# Patient Record
Sex: Female | Born: 1940 | Race: Black or African American | Hispanic: No | Marital: Single | Smoking: Never smoker
Health system: Southern US, Community
[De-identification: ages and names within clinical notes are randomized; demographics above are authoritative.]

## PROBLEM LIST (undated history)

## (undated) DIAGNOSIS — E119 Type 2 diabetes mellitus without complications: Secondary | ICD-10-CM

## (undated) HISTORY — PX: ABDOMINAL HYSTERECTOMY: SHX81

## (undated) HISTORY — PX: NECK SURGERY: SHX720

## (undated) HISTORY — PX: BACK SURGERY: SHX140

## (undated) HISTORY — PX: REPLACEMENT TOTAL KNEE BILATERAL: SUR1225

---

## 2017-07-02 ENCOUNTER — Emergency Department (HOSPITAL_BASED_OUTPATIENT_CLINIC_OR_DEPARTMENT_OTHER)
Admission: EM | Admit: 2017-07-02 | Discharge: 2017-07-02 | Disposition: A | Discharge status: Home / Self Care | Payer: Medicare HMO | Attending: Emergency Medicine | Admitting: Emergency Medicine

## 2017-07-02 ENCOUNTER — Encounter (HOSPITAL_BASED_OUTPATIENT_CLINIC_OR_DEPARTMENT_OTHER): Payer: Self-pay | Admitting: Emergency Medicine

## 2017-07-02 ENCOUNTER — Emergency Department (HOSPITAL_BASED_OUTPATIENT_CLINIC_OR_DEPARTMENT_OTHER): Payer: Medicare HMO

## 2017-07-02 DIAGNOSIS — E119 Type 2 diabetes mellitus without complications: Secondary | ICD-10-CM | POA: Diagnosis not present

## 2017-07-02 DIAGNOSIS — R519 Headache, unspecified: Secondary | ICD-10-CM

## 2017-07-02 DIAGNOSIS — R51 Headache: Secondary | ICD-10-CM | POA: Diagnosis present

## 2017-07-02 HISTORY — DX: Type 2 diabetes mellitus without complications: E11.9

## 2017-07-02 LAB — COMPREHENSIVE METABOLIC PANEL
ALK PHOS: 42 U/L (ref 38–126)
ALT: 16 U/L (ref 14–54)
AST: 18 U/L (ref 15–41)
Albumin: 3.6 g/dL (ref 3.5–5.0)
Anion gap: 3 — ABNORMAL LOW (ref 5–15)
BUN: 19 mg/dL (ref 6–20)
CALCIUM: 8.9 mg/dL (ref 8.9–10.3)
CHLORIDE: 110 mmol/L (ref 101–111)
CO2: 30 mmol/L (ref 22–32)
CREATININE: 1.42 mg/dL — AB (ref 0.44–1.00)
GFR, EST AFRICAN AMERICAN: 41 mL/min — AB (ref >60)
GFR, EST NON AFRICAN AMERICAN: 35 mL/min — AB (ref >60)
Glucose, Bld: 172 mg/dL — ABNORMAL HIGH (ref 65–99)
Potassium: 4.7 mmol/L (ref 3.5–5.1)
Sodium: 143 mmol/L (ref 135–145)
Total Bilirubin: 1.2 mg/dL (ref 0.3–1.2)
Total Protein: 6.9 g/dL (ref 6.5–8.1)

## 2017-07-02 LAB — CBC WITH DIFFERENTIAL/PLATELET
BASOS PCT: 0 %
Basophils Absolute: 0 10*3/uL (ref 0.0–0.1)
EOS ABS: 0.2 10*3/uL (ref 0.0–0.7)
EOS PCT: 2 %
HCT: 36.5 % (ref 36.0–46.0)
Hemoglobin: 12.4 g/dL (ref 12.0–15.0)
LYMPHS ABS: 2.2 10*3/uL (ref 0.7–4.0)
Lymphocytes Relative: 26 %
MCH: 30.6 pg (ref 26.0–34.0)
MCHC: 34 g/dL (ref 30.0–36.0)
MCV: 90.1 fL (ref 78.0–100.0)
Monocytes Absolute: 0.6 10*3/uL (ref 0.1–1.0)
Monocytes Relative: 7 %
NEUTROS PCT: 65 %
Neutro Abs: 5.6 10*3/uL (ref 1.7–7.7)
PLATELETS: 197 10*3/uL (ref 150–400)
RBC: 4.05 MIL/uL (ref 3.87–5.11)
RDW: 14.4 % (ref 11.5–15.5)
WBC: 8.6 10*3/uL (ref 4.0–10.5)

## 2017-07-02 MED ORDER — METOCLOPRAMIDE HCL 5 MG/ML IJ SOLN
10.0000 mg | Freq: Once | INTRAMUSCULAR | Status: AC
  Administered 2017-07-02: 10 mg via INTRAVENOUS
  Filled 2017-07-02: qty 2

## 2017-07-02 MED ORDER — SODIUM CHLORIDE 0.9 % IV BOLUS (SEPSIS)
1000.0000 mL | Freq: Once | INTRAVENOUS | Status: AC
  Administered 2017-07-02: 1000 mL via INTRAVENOUS

## 2017-07-02 MED ORDER — DIPHENHYDRAMINE HCL 50 MG/ML IJ SOLN
12.5000 mg | Freq: Once | INTRAMUSCULAR | Status: AC
  Administered 2017-07-02: 12.5 mg via INTRAVENOUS
  Filled 2017-07-02: qty 1

## 2017-07-02 MED ORDER — SUMATRIPTAN SUCCINATE 100 MG PO TABS: 100.0000 mg | ORAL_TABLET | Freq: Four times a day (QID) | ORAL | 0 refills | Status: DC | PRN

## 2017-07-02 NOTE — ED Notes (Signed)
Pt ambulating with cane, helped into wheelchair.

## 2017-07-02 NOTE — ED Provider Notes (Signed)
[facility]   CSN: [id number] Arrival date & time: 07/02/17  1044     History   Chief Complaint Chief Complaint  Patient presents with  . Headache    HPI Katherine Cook is a 76 y.o. female history diabetes here presenting with headaches. Patient states that she has posterior headaches for the last week or so. She told the nurse she has been having headaches for about 6 weeks but told me about the last week or so. She states that there is a spot on her scalp that seemed to swell up occasionally and goes away. She denies any associated neck pain or stiffness or nausea or vomiting or blurry vision or photophobia. She states that she takes Tylenol every 6 hours and it does help but it usually comes back after the Tylenol wears off. She denies any trouble speaking or slurred speech or weakness or numbness. She denies any history of strokes previously or history of headaches or migraines or aneurysms.   The history is provided by the patient.    Past Medical History:  Diagnosis Date  . Diabetes mellitus without complication (HCC)     There are no active problems to display for this patient.   Past Surgical History:  Procedure Laterality Date  . ABDOMINAL HYSTERECTOMY    . BACK SURGERY    . NECK SURGERY    . REPLACEMENT TOTAL KNEE BILATERAL      OB History    No data available       Home Medications    Prior to Admission medications   Not on File    Family History History reviewed. No pertinent family history.  Social History Social History  Substance Use Topics  . Smoking status: Never Smoker  . Smokeless tobacco: Never Used  . Alcohol use No     Allergies   Patient has no known allergies.   Review of Systems Review of Systems  Neurological: Positive for headaches.  All other systems reviewed and are negative.    Physical Exam Updated Vital Signs BP 129/73 (BP Location: Right Arm)   Pulse 80   Temp 98.7 F (37.1 C) (Oral)    Resp 20   Ht  (1.778 m)   Wt (!) 139.7 kg (308 lb)   SpO2 95%   BMI 44.19 kg/m   Physical Exam  Constitutional: She is oriented to person, place, and time. She appears well-developed and well-nourished.  HENT:  Head: Normocephalic.  Right Ear: External ear normal.  Left Ear: External ear normal.  Mouth/Throat: Oropharynx is clear and moist.  Eyes: Pupils are equal, round, and reactive to light. Conjunctivae and EOM are normal.  Neck: Normal range of motion. Neck supple.  No meningeal signs   Cardiovascular: Normal rate, regular rhythm and normal heart sounds.   Pulmonary/Chest: Effort normal and breath sounds normal. No respiratory distress. She has no wheezes. She has no rales.  Abdominal: Soft. Bowel sounds are normal. She exhibits no distension. There is no tenderness. There is no guarding.  Musculoskeletal: Normal range of motion.  Neurological: She is alert and oriented to person, place, and time. She displays normal reflexes. No cranial nerve deficit. Coordination normal.  CN 2-12 intact. Nl strength and sensation throughout. Nl finger to nose. Nl gait. Neg rhomberg   Skin: Skin is warm.  Psychiatric: She has a normal mood and affect.  Nursing note and vitals reviewed.    ED Treatments / Results  Labs (all labs ordered  are listed, but only abnormal results are displayed) Labs Reviewed  COMPREHENSIVE METABOLIC PANEL - Abnormal; Notable for the following:       Result Value   Glucose, Bld 172 (*)    Creatinine, Ser 1.42 (*)    GFR calc non Af Amer 35 (*)    GFR calc Af Amer 41 (*)    Anion gap 3 (*)    All other components within normal limits  CBC WITH DIFFERENTIAL/PLATELET    EKG  EKG Interpretation None       Radiology Ct Head Wo Contrast  Result Date: 07/02/2017 CLINICAL DATA:  Headaches for several weeks EXAM: CT HEAD WITHOUT CONTRAST TECHNIQUE: Contiguous axial images were obtained from the base of the skull through the vertex without  intravenous contrast. COMPARISON:  None. FINDINGS: Brain: Scattered areas of decreased attenuation are noted within the deep white matter consistent with chronic white matter ischemic change. No definitive acute infarct is seen. No hemorrhage or space-occupying mass lesion is noted. Vascular: No hyperdense vessel or unexpected calcification. Skull: Normal. Negative for fracture or focal lesion. Sinuses/Orbits: No acute finding. Other: A falx lipoma is noted. IMPRESSION: Chronic white matter ischemic change.  No acute abnormality noted. Electronically Signed   By: Alcide Clever M.D.   On: 07/02/2017 11:43    Procedures Procedures (including critical care time)  Medications Ordered in ED Medications  sodium chloride 0.9 % bolus 1,000 mL (1,000 mLs Intravenous New Bag/Given 07/02/17 1202)  metoCLOPramide (REGLAN) injection 10 mg (10 mg Intravenous Given 07/02/17 1203)  diphenhydrAMINE (BENADRYL) injection 12.5 mg (12.5 mg Intravenous Given 07/02/17 1205)     Initial Impression / Assessment and Plan / ED Course  I have reviewed the triage vital signs and the nursing notes.  Pertinent labs & imaging results that were available during my care of the patient were reviewed by me and considered in my medical decision making (see chart for details).     Katherine Cook is a 76 y.o. female here with headaches. Nl neuro exam currently. Low suspicion for [facility] or stroke. Will get CT head given age. Will get labs, give migraine cocktail   12:44 PM Labs showed Cr 1.4, no baseline. Glucose 172. CT head unremarkable. Felt better with migraine cocktail. Will give imitrex prn, have patient follow up with neurology.   Final Clinical Impressions(s) / ED Diagnoses   Final diagnoses:  None    New Prescriptions New Prescriptions   No medications on file     Charlynne Pander, MD 07/02/17 1245

## 2017-07-02 NOTE — ED Triage Notes (Signed)
Pt c/o HA for about 6 wks; Tylenol helps, but it always comes back

## 2017-07-02 NOTE — Discharge Instructions (Signed)
Take tylenol, motrin for headaches.   Try imitrex for severe headaches.   Call [facility] for follow up for management of headaches.   Return to ER if you have worse headaches, vomiting, blurry vision, weakness, numbness, trouble speaking

## 2018-04-23 IMAGING — CT CT HEAD W/O CM
3 series · 16 of 47 positions shown, 19 images · non-contrast
Comparison: None.

CLINICAL DATA: Headaches for several weeks

EXAM:
CT HEAD WITHOUT CONTRAST
TECHNIQUE: Contiguous axial images were obtained from the base of the skull
through the vertex without intravenous contrast.

[Series 2: head wo · axial · 0.42mm/px · z∈[-160,-25]mm · 10 of 33 slices shown, 13 images]
[im 3/33  brain]
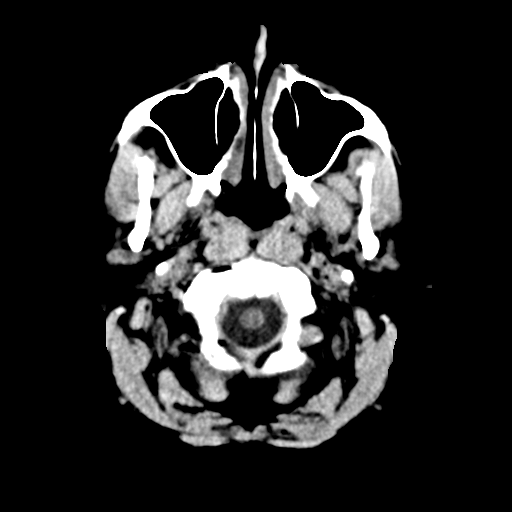
[im 3/33  bone]
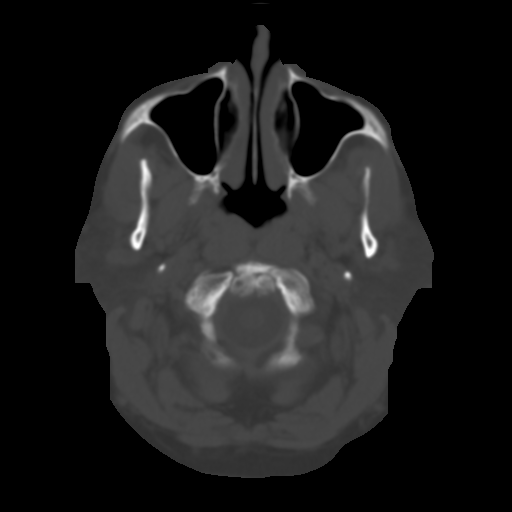
[im 6/33  brain]
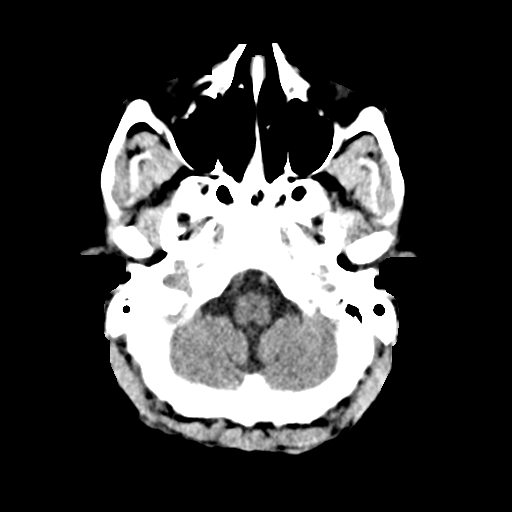
[im 9/33  brain]
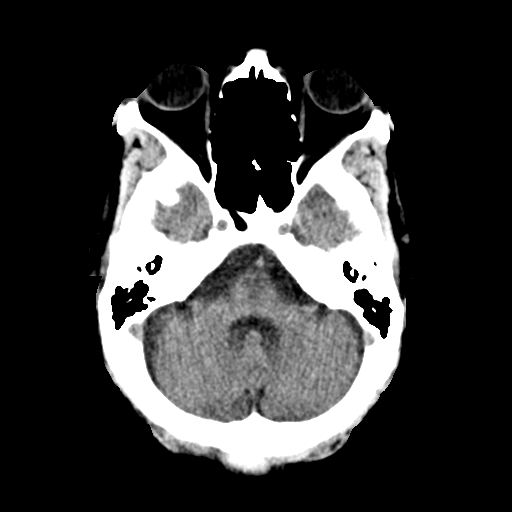
[im 12/33  brain]
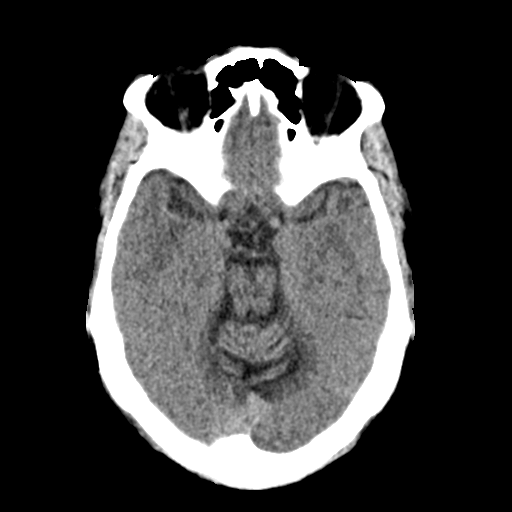
[im 15/33  brain]
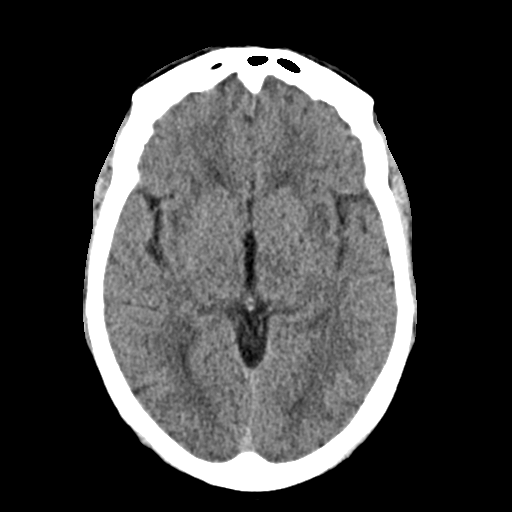
[im 15/33  bone]
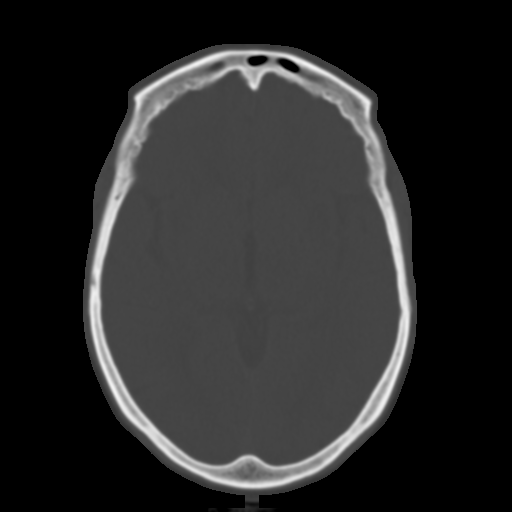
[im 18/33  brain]
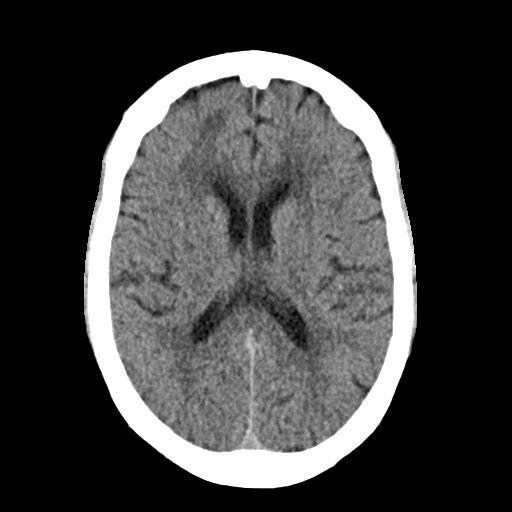
[im 21/33  brain]
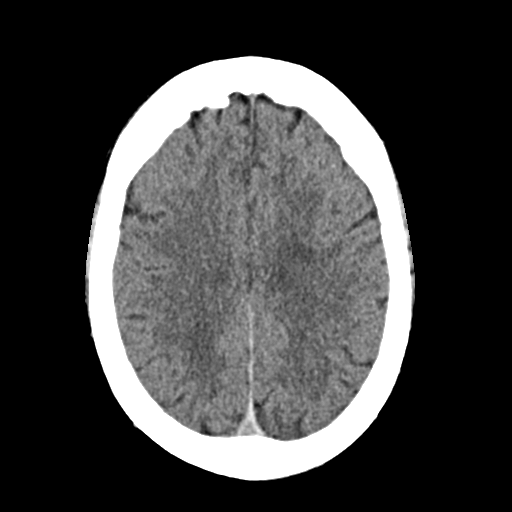
[im 25/33  brain]
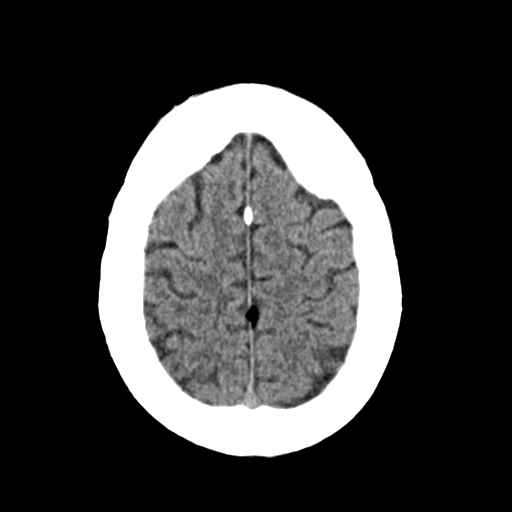
[im 27/33  brain]
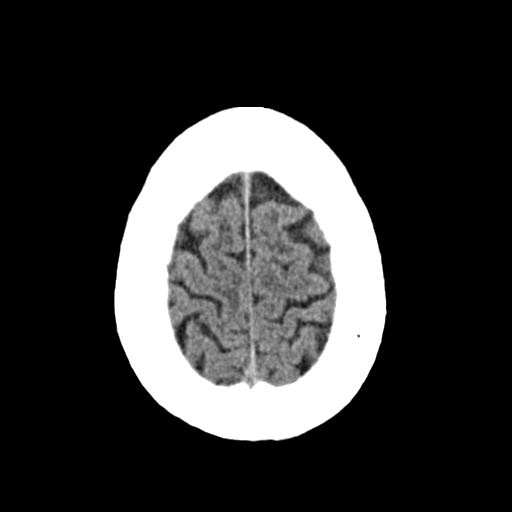
[im 27/33  bone]
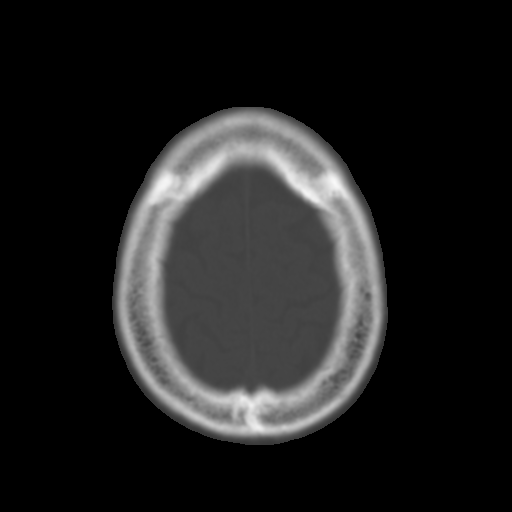
[im 30/33  brain]
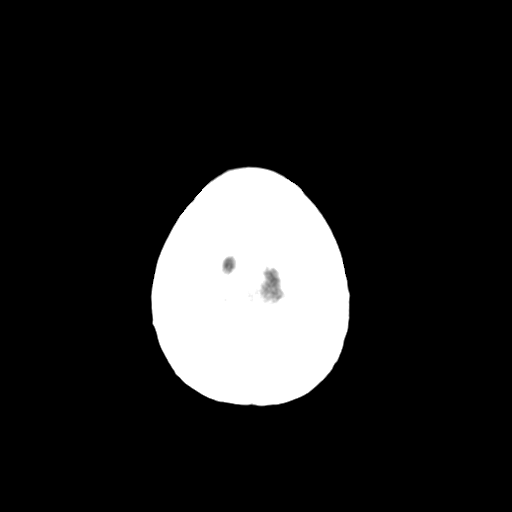

[Series 4: coronal soft · coronal · 0.37mm/px · 3 of 67 slices shown]
[im 23/67  brain]
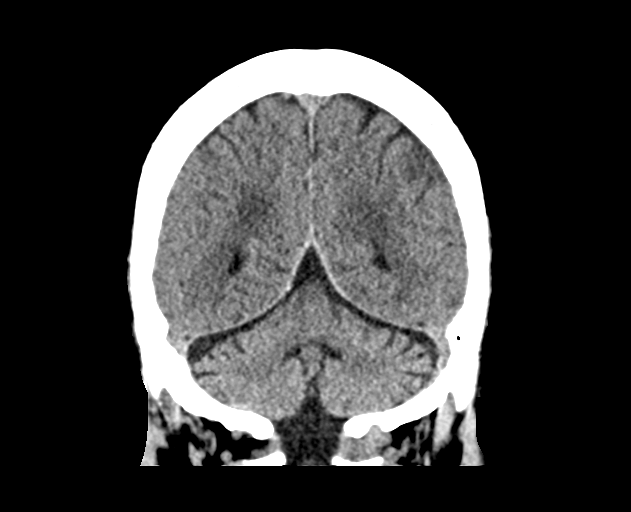
[im 30/67  brain]
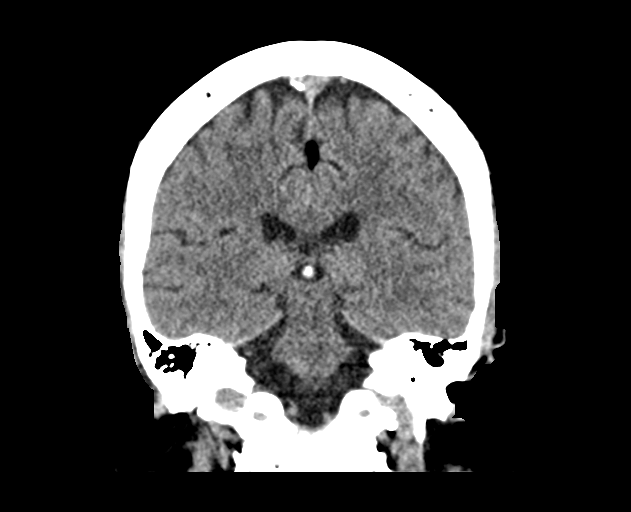
[im 37/67  brain]
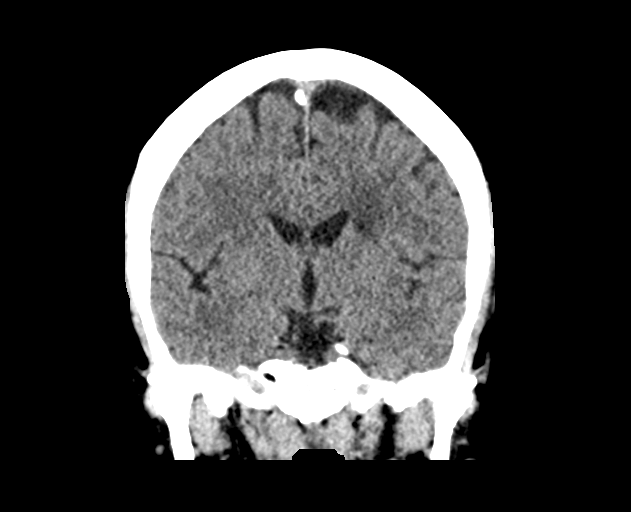

[Series 5: sag soft · sagittal · 0.37mm/px · 3 of 55 slices shown]
[im 19/55  brain]
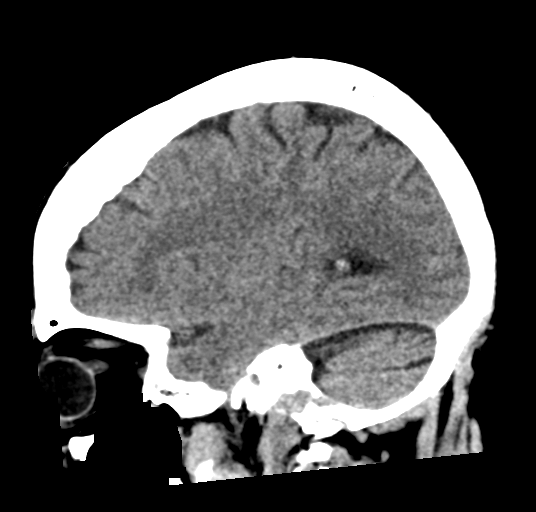
[im 28/55  brain]
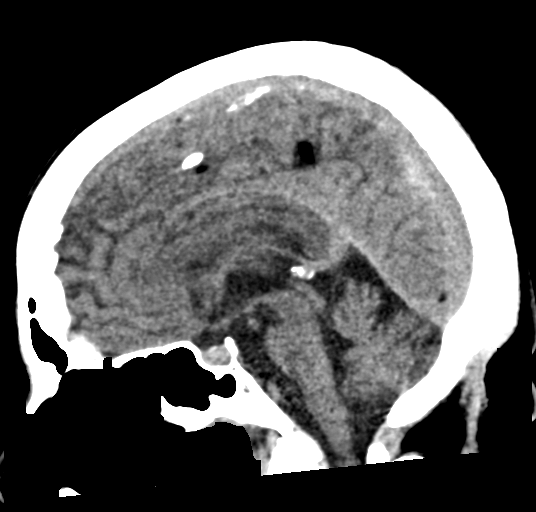
[im 37/55  brain]
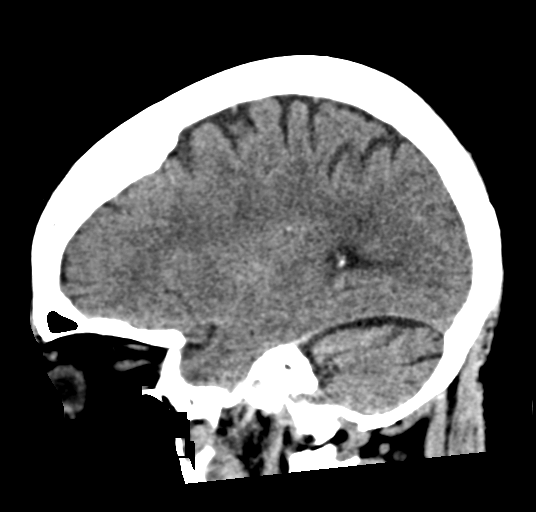

[16 of 47 positions shown; findings below may reference images not displayed]

FINDINGS: Brain: Scattered areas of decreased attenuation are noted within the
deep white matter consistent with chronic white matter ischemic
change. No definitive acute infarct is seen. No hemorrhage or
space-occupying mass lesion is noted.

Vascular: No hyperdense vessel or unexpected calcification.

Skull: Normal. Negative for fracture or focal lesion.

Sinuses/Orbits: No acute finding.

Other: A falx lipoma is noted.
IMPRESSION: Chronic white matter ischemic change.  No acute abnormality noted.

## 2019-07-05 ENCOUNTER — Emergency Department (HOSPITAL_BASED_OUTPATIENT_CLINIC_OR_DEPARTMENT_OTHER)
Admission: EM | Admit: 2019-07-05 | Discharge: 2019-07-05 | Disposition: A | Discharge status: Home / Self Care | Payer: Medicare PPO | Attending: Emergency Medicine | Admitting: Emergency Medicine

## 2019-07-05 ENCOUNTER — Encounter (HOSPITAL_BASED_OUTPATIENT_CLINIC_OR_DEPARTMENT_OTHER): Payer: Self-pay

## 2019-07-05 ENCOUNTER — Emergency Department (HOSPITAL_BASED_OUTPATIENT_CLINIC_OR_DEPARTMENT_OTHER): Payer: Medicare PPO

## 2019-07-05 ENCOUNTER — Other Ambulatory Visit: Payer: Self-pay

## 2019-07-05 DIAGNOSIS — R739 Hyperglycemia, unspecified: Secondary | ICD-10-CM

## 2019-07-05 DIAGNOSIS — E1165 Type 2 diabetes mellitus with hyperglycemia: Secondary | ICD-10-CM | POA: Diagnosis present

## 2019-07-05 DIAGNOSIS — Z96653 Presence of artificial knee joint, bilateral: Secondary | ICD-10-CM | POA: Diagnosis not present

## 2019-07-05 LAB — CBC WITH DIFFERENTIAL/PLATELET
Abs Immature Granulocytes: 0.03 10*3/uL (ref 0.00–0.07)
Basophils Absolute: 0 10*3/uL (ref 0.0–0.1)
Basophils Relative: 0 %
Eosinophils Absolute: 0.2 10*3/uL (ref 0.0–0.5)
Eosinophils Relative: 2 %
HCT: 40.4 % (ref 36.0–46.0)
Hemoglobin: 13.1 g/dL (ref 12.0–15.0)
Immature Granulocytes: 0 %
Lymphocytes Relative: 23 %
Lymphs Abs: 2.4 10*3/uL (ref 0.7–4.0)
MCH: 29.8 pg (ref 26.0–34.0)
MCHC: 32.4 g/dL (ref 30.0–36.0)
MCV: 92 fL (ref 80.0–100.0)
Monocytes Absolute: 0.7 10*3/uL (ref 0.1–1.0)
Monocytes Relative: 7 %
Neutro Abs: 6.7 10*3/uL (ref 1.7–7.7)
Neutrophils Relative %: 68 %
Platelets: 207 10*3/uL (ref 150–400)
RBC: 4.39 MIL/uL (ref 3.87–5.11)
RDW: 13.7 % (ref 11.5–15.5)
WBC: 10.1 10*3/uL (ref 4.0–10.5)
nRBC: 0 % (ref 0.0–0.2)

## 2019-07-05 LAB — COMPREHENSIVE METABOLIC PANEL
ALT: 15 U/L (ref 0–44)
AST: 17 U/L (ref 15–41)
Albumin: 4 g/dL (ref 3.5–5.0)
Alkaline Phosphatase: 41 U/L (ref 38–126)
Anion gap: 10 (ref 5–15)
BUN: 20 mg/dL (ref 8–23)
CO2: 27 mmol/L (ref 22–32)
Calcium: 9.5 mg/dL (ref 8.9–10.3)
Chloride: 102 mmol/L (ref 98–111)
Creatinine, Ser: 1.54 mg/dL — ABNORMAL HIGH (ref 0.44–1.00)
GFR calc Af Amer: 37 mL/min — ABNORMAL LOW (ref >60)
GFR calc non Af Amer: 32 mL/min — ABNORMAL LOW (ref >60)
Glucose, Bld: 365 mg/dL — ABNORMAL HIGH (ref 70–99)
Potassium: 4.1 mmol/L (ref 3.5–5.1)
Sodium: 139 mmol/L (ref 135–145)
Total Bilirubin: 1.3 mg/dL — ABNORMAL HIGH (ref 0.3–1.2)
Total Protein: 7.5 g/dL (ref 6.5–8.1)

## 2019-07-05 LAB — URINALYSIS, MICROSCOPIC (REFLEX)

## 2019-07-05 LAB — URINALYSIS, ROUTINE W REFLEX MICROSCOPIC
Bilirubin Urine: NEGATIVE
Glucose, UA: >=500 mg/dL — AB
Hgb urine dipstick: NEGATIVE
Ketones, ur: NEGATIVE mg/dL
Leukocytes,Ua: NEGATIVE
Nitrite: NEGATIVE
Protein, ur: 100 mg/dL — AB
Specific Gravity, Urine: 1.02 (ref 1.005–1.030)
pH: 5.5 (ref 5.0–8.0)

## 2019-07-05 LAB — CBG MONITORING, ED: Glucose-Capillary: 312 mg/dL — ABNORMAL HIGH (ref 70–99)

## 2019-07-05 MED ORDER — SODIUM CHLORIDE 0.9 % IV BOLUS
1000.0000 mL | Freq: Once | INTRAVENOUS | Status: AC
  Administered 2019-07-05: 14:00:00 1000 mL via INTRAVENOUS

## 2019-07-05 NOTE — ED Triage Notes (Signed)
Pt c/o elevated BS-states she was seen by PCP last week and is changing insulin doses-pt also c/o cough x 2 days-denies fever-to triage in w/c

## 2019-07-05 NOTE — ED Provider Notes (Signed)
[facility] EMERGENCY DEPARTMENT Provider Note   CSN: [id number] Arrival date & time: 07/05/19  1259     History   Chief Complaint Chief Complaint  Patient presents with  . Hyperglycemia  . Cough    HPI Katherine Cook is a 78 y.o. female.  She has a history of diabetes.  She said she was at her PCPs office last week and they found her sugar to be elevated and increased her insulin from 10 to 13 units at night.  Since Monday she has had a mild sore throat and this feeling like there is some phlegm stuck back there.  Her sugars have also been elevated.  No fevers or chills no chest pain no abdominal pain vomiting diarrhea or urinary symptoms.  No sick contacts or recent travel.     The history is provided by the patient.  Hyperglycemia Blood sugar level PTA:  300 Severity:  Moderate Onset quality:  Gradual Timing:  Intermittent Progression:  Worsening Chronicity:  New Diabetes status:  Controlled with insulin Context: change in medication and recent illness (?)   Relieved by:  Nothing Ineffective treatments:  None tried Associated symptoms: no abdominal pain, no blurred vision, no chest pain, no dysuria, no fever, no nausea, no shortness of breath, no vomiting and no weakness   Cough Associated symptoms: sore throat   Associated symptoms: no chest pain, no fever, no rash and no shortness of breath     Past Medical History:  Diagnosis Date  . Diabetes mellitus without complication (HCC)     There are no active problems to display for this patient.   Past Surgical History:  Procedure Laterality Date  . ABDOMINAL HYSTERECTOMY    . BACK SURGERY    . NECK SURGERY    . REPLACEMENT TOTAL KNEE BILATERAL       OB History   No obstetric history on file.      Home Medications    Prior to Admission medications   Medication Sig Start Date End Date Taking? Authorizing Provider  SUMAtriptan (IMITREX) 100 MG tablet Take 1 tablet (100 mg total) by mouth every 6  (six) hours as needed for migraine. May repeat in 2 hours if headache persists or recurs. 07/02/17   Charlynne Pander, MD    Family History No family history on file.  Social History Social History   Tobacco Use  . Smoking status: Never Smoker  . Smokeless tobacco: Never Used  Substance Use Topics  . Alcohol use: No  . Drug use: No     Allergies   Patient has no known allergies.   Review of Systems Review of Systems  Constitutional: Negative for fever.  HENT: Positive for sore throat.   Eyes: Negative for blurred vision and visual disturbance.  Respiratory: Positive for cough. Negative for shortness of breath.   Cardiovascular: Negative for chest pain.  Gastrointestinal: Negative for abdominal pain, nausea and vomiting.  Genitourinary: Negative for dysuria.  Musculoskeletal: Negative for neck pain.  Skin: Negative for rash.  Neurological: Negative for weakness.     Physical Exam Updated Vital Signs BP 135/76 (BP Location: Left Arm)   Pulse 80   Temp 98.8 F (37.1 C) (Oral)   Resp 20   Ht 5\' 10"  (1.778 m)   Wt (!) 144.2 kg   SpO2 93%   BMI 45.63 kg/m   Physical Exam Vitals signs and nursing note reviewed.  Constitutional:      General: She is not in acute  distress.    Appearance: She is well-developed. She is obese.  HENT:     Head: Normocephalic and atraumatic.     Nose: Nose normal.     Mouth/Throat:     Mouth: Mucous membranes are moist.     Pharynx: Oropharynx is clear. No oropharyngeal exudate or posterior oropharyngeal erythema.  Eyes:     Conjunctiva/sclera: Conjunctivae normal.  Neck:     Musculoskeletal: Neck supple.  Cardiovascular:     Rate and Rhythm: Normal rate and regular rhythm.     Pulses: Normal pulses.     Heart sounds: No murmur.  Pulmonary:     Effort: Pulmonary effort is normal. No respiratory distress.     Breath sounds: Normal breath sounds.  Abdominal:     Palpations: Abdomen is soft.     Tenderness: There is no  abdominal tenderness.  Musculoskeletal: Normal range of motion.  Skin:    General: Skin is warm and dry.     Capillary Refill: Capillary refill takes less than 2 seconds.  Neurological:     General: No focal deficit present.     Mental Status: She is alert. Mental status is at baseline.      ED Treatments / Results  Labs (all labs ordered are listed, but only abnormal results are displayed) Labs Reviewed  COMPREHENSIVE METABOLIC PANEL - Abnormal; Notable for the following components:      Result Value   Glucose, Bld 365 (*)    Creatinine, Ser 1.54 (*)    Total Bilirubin 1.3 (*)    GFR calc non Af Amer 32 (*)    GFR calc Af Amer 37 (*)    All other components within normal limits  URINALYSIS, ROUTINE W REFLEX MICROSCOPIC - Abnormal; Notable for the following components:   Glucose, UA >=500 (*)    Protein, ur 100 (*)    All other components within normal limits  URINALYSIS, MICROSCOPIC (REFLEX) - Abnormal; Notable for the following components:   Bacteria, UA FEW (*)    All other components within normal limits  CBG MONITORING, ED - Abnormal; Notable for the following components:   Glucose-Capillary 312 (*)    All other components within normal limits  URINE CULTURE  CBC WITH DIFFERENTIAL/PLATELET  CBG MONITORING, ED    EKG None  Radiology Dg Chest Port 1 View  Result Date: 07/05/2019 CLINICAL DATA:  Cough. EXAM: PORTABLE CHEST 1 VIEW COMPARISON:  Radiographs of August 29, 2016. FINDINGS: The heart size and mediastinal contours are within normal limits. Both lungs are clear. No pneumothorax or pleural effusion is noted. The visualized skeletal structures are unremarkable. IMPRESSION: No active disease. Electronically Signed   By: Lupita RaiderJames  Green Jr M.D.   On: 07/05/2019 14:58    Procedures Procedures (including critical care time)  Medications Ordered in ED Medications  sodium chloride 0.9 % bolus 1,000 mL (1,000 mLs Intravenous New Bag/Given 07/05/19 1426)      Initial Impression / Assessment and Plan / ED Course  I have reviewed the triage vital signs and the nursing notes.  Pertinent labs & imaging results that were available during my care of the patient were reviewed by me and considered in my medical decision making (see chart for details).  Clinical Course as of Jul 05 1639  Thu Jul 05, 2019  [id number] 78 year old diabetic here with elevated blood sugars in the setting of what sounds like an upper respiratory infection with sore throat and some chest congestion.  Afebrile here.  Getting  labs chest x-ray urinalysis and getting some IV fluids.   [MB]  1502 Patient signed out to Dr. Tyrone Nine with urinalysis to follow-up on prior.  She likely can be discharged to home and have her follow-up with her primary care doctor for further management of her elevated blood sugars.   [MB]    Clinical Course User Index [MB] Hayden Rasmussen, MD        Final Clinical Impressions(s) / ED Diagnoses   Final diagnoses:  Hyperglycemia    ED Discharge Orders    None       Hayden Rasmussen, MD 07/05/19 [phone]

## 2019-07-06 LAB — URINE CULTURE: Culture: <10000 — AB

## 2019-11-03 ENCOUNTER — Ambulatory Visit: Payer: Medicare PPO | Attending: Internal Medicine

## 2019-11-03 DIAGNOSIS — Z23 Encounter for immunization: Secondary | ICD-10-CM | POA: Insufficient documentation

## 2019-12-01 ENCOUNTER — Ambulatory Visit: Payer: Medicare PPO

## 2019-12-08 ENCOUNTER — Ambulatory Visit: Payer: Medicare HMO | Attending: Internal Medicine

## 2019-12-08 ENCOUNTER — Ambulatory Visit: Payer: Medicare PPO

## 2019-12-08 DIAGNOSIS — Z23 Encounter for immunization: Secondary | ICD-10-CM

## 2019-12-08 NOTE — Progress Notes (Signed)
   Covid-19 Vaccination Clinic  Name:  Katherine Cook    MRN: [id number] DOB: 1941/06/16  12/08/2019  [name] was observed post Covid-19 immunization for 15 minutes without incidence. She was provided with Vaccine Information Sheet and instruction to access the V-Safe system.   [name] was instructed to call 911 with any severe reactions post vaccine: Marland Kitchen Difficulty breathing  . Swelling of your face and throat  . A fast heartbeat  . A bad rash all over your body  . Dizziness and weakness    Immunizations Administered    Name Date Dose VIS Date Route   Moderna COVID-19 Vaccine 12/08/2019  8:50 AM 0.5 mL 09/11/2019 Intramuscular   Manufacturer: Moderna   Lot: [id number]   NDC: 99967-227-73

## 2020-04-25 IMAGING — DX DG CHEST 1V PORT
1 series · 1 of 1 positions shown · non-contrast
Comparison: Radiographs August 29, 2016.

CLINICAL DATA: Cough.

EXAM:
PORTABLE CHEST 1 VIEW

[chest ap]
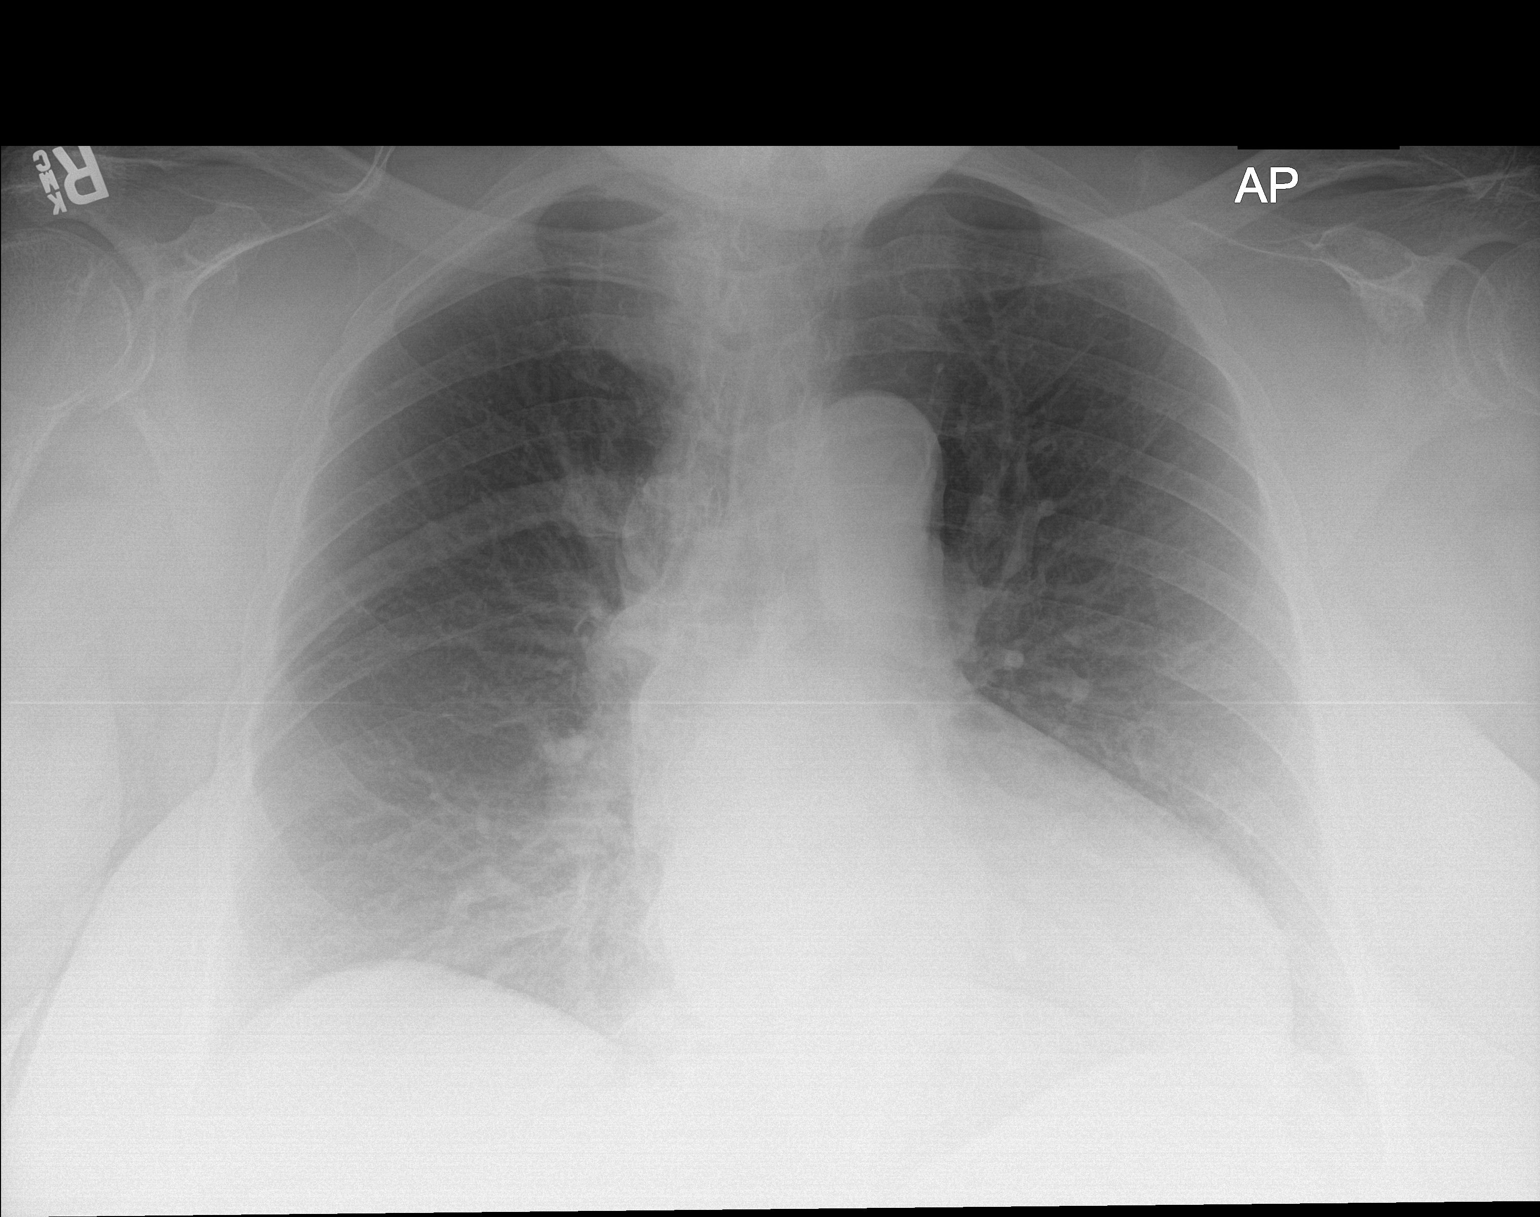

[1 of 1 positions shown; findings below may reference images not displayed]

FINDINGS: The heart size and mediastinal contours are within normal limits.
Both lungs are clear. No pneumothorax or pleural effusion is noted.
The visualized skeletal structures are unremarkable.
IMPRESSION: No active disease.

## 2020-10-15 ENCOUNTER — Other Ambulatory Visit: Payer: Self-pay | Admitting: Sports Medicine

## 2020-10-15 DIAGNOSIS — M545 Low back pain, unspecified: Secondary | ICD-10-CM

## 2020-11-04 ENCOUNTER — Other Ambulatory Visit: Payer: Medicare HMO

## 2022-09-06 ENCOUNTER — Emergency Department (HOSPITAL_COMMUNITY)
Admission: EM | Admit: 2022-09-06 | Discharge: 2022-09-06 | Disposition: A | Discharge status: Home / Self Care | Payer: Medicare HMO | Attending: Emergency Medicine | Admitting: Emergency Medicine

## 2022-09-06 ENCOUNTER — Encounter (HOSPITAL_COMMUNITY): Payer: Self-pay

## 2022-09-06 ENCOUNTER — Emergency Department (HOSPITAL_COMMUNITY): Payer: Medicare HMO

## 2022-09-06 ENCOUNTER — Other Ambulatory Visit: Payer: Self-pay

## 2022-09-06 DIAGNOSIS — S62339A Displaced fracture of neck of unspecified metacarpal bone, initial encounter for closed fracture: Secondary | ICD-10-CM

## 2022-09-06 DIAGNOSIS — E119 Type 2 diabetes mellitus without complications: Secondary | ICD-10-CM | POA: Diagnosis not present

## 2022-09-06 DIAGNOSIS — Z79899 Other long term (current) drug therapy: Secondary | ICD-10-CM | POA: Insufficient documentation

## 2022-09-06 DIAGNOSIS — Y92009 Unspecified place in unspecified non-institutional (private) residence as the place of occurrence of the external cause: Secondary | ICD-10-CM | POA: Insufficient documentation

## 2022-09-06 DIAGNOSIS — S99922A Unspecified injury of left foot, initial encounter: Secondary | ICD-10-CM | POA: Diagnosis present

## 2022-09-06 DIAGNOSIS — W1839XA Other fall on same level, initial encounter: Secondary | ICD-10-CM | POA: Insufficient documentation

## 2022-09-06 DIAGNOSIS — Z794 Long term (current) use of insulin: Secondary | ICD-10-CM | POA: Insufficient documentation

## 2022-09-06 DIAGNOSIS — I10 Essential (primary) hypertension: Secondary | ICD-10-CM | POA: Diagnosis not present

## 2022-09-06 DIAGNOSIS — Z7984 Long term (current) use of oral hypoglycemic drugs: Secondary | ICD-10-CM | POA: Insufficient documentation

## 2022-09-06 DIAGNOSIS — S9032XA Contusion of left foot, initial encounter: Secondary | ICD-10-CM | POA: Insufficient documentation

## 2022-09-06 MED ORDER — OXYCODONE-ACETAMINOPHEN 5-325 MG PO TABS
1.0000 | ORAL_TABLET | Freq: Once | ORAL | Status: AC
  Administered 2022-09-06: 1 via ORAL
  Filled 2022-09-06: qty 1

## 2022-09-06 NOTE — ED Provider Triage Note (Signed)
Emergency Medicine Provider Triage Evaluation Note  Katherine Cook , a 81 y.o. female  was evaluated in triage.  Pt complains of foot pain.  Patient fell walking up her steps 2 days ago.  She complains of pain in the top of her left foot.  She normally uses a walker at home but has not been ambulating since her injury.  She denies hitting her head or loss of consciousness  Review of Systems  Positive: Foot pain Negative: Knee pain  Physical Exam  BP 119/73 (BP Location: Right Arm)   Pulse 68   Temp 98.3 F (36.8 C)   Resp 18   Ht 5\' 10"  (1.778 m)   Wt 126.6 kg   SpO2 97%   BMI 40.03 kg/m  Gen:   Awake, no distress   Resp:  Normal effort  MSK:   Moves extremities without difficulty  Other:  Range of motion of the foot knee and hip on the left  Medical Decision Making  Medically screening exam initiated at 1:54 PM.  Appropriate orders placed.  Katherine Cook was informed that the remainder of the evaluation will be completed by another provider, this initial triage assessment does not replace that evaluation, and the importance of remaining in the ED until their evaluation is complete.     Katherine Polo, PA-C 09/06/22 1355

## 2022-09-06 NOTE — Progress Notes (Signed)
Orthopedic Tech Progress Note Patient Details:  Katherine Cook 1941-01-08 [id number]  Ortho Devices Type of Ortho Device: CAM walker Ortho Device/Splint Location: lle Ortho Device/Splint Interventions: Ordered, Application, Adjustment   Post Interventions Patient Tolerated: Well Instructions Provided: Care of device, Adjustment of device  Trinna Post 09/06/2022, 9:33 PM

## 2022-09-06 NOTE — ED Provider Notes (Signed)
[facility] EMERGENCY DEPARTMENT Provider Note   CSN: [id number] Arrival date & time: 09/06/22  1220     History  Chief Complaint  Patient presents with   Foot Pain    Katherine Cook is a 81 y.o. female.   Foot Pain   Patient is an 81 year old female, she is a diabetic on glimepiride, insulin, she has hypertension on amlodipine, high cholesterol on atorvastatin and she does take donepezil.  She presents approximately 2 days after having a fall at home.  This occurred when she was trying to walk up the steps, she lost her balance and had her foot hyperflexed in the plantar position, she fell to the ground and required help getting into bed.  She has not been able to bear weight on her left foot since that time.  She states that she has been having numb legs bilaterally for quite some time and is already seen her family doctor about it, she states they were not particularly interested in working that up because she does have diabetes and thought to be neuropathy.  In fact I have reviewed the medical record and the patient did see a family doctor, Dr. 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    Home Medications Prior to Admission medications   Medication Sig Start Date End Date Taking? Authorizing Provider  SUMAtriptan (IMITREX) 100 MG tablet Take 1 tablet (100 mg total) by mouth every 6 (six) hours as needed for migraine. May repeat in 2 hours if headache persists or recurs. 07/02/17   Charlynne Pander, MD      Allergies    Patient has no known allergies.    Review of Systems   Review of Systems  All other systems reviewed and are negative.   Physical Exam Updated Vital Signs BP 125/65   Pulse 67   Temp 98.7 F (37.1 C) (Oral)   Resp 17   Ht 1.778  m (5\' 10" )   Wt 126.6 kg   SpO2 92%   BMI 40.03 kg/m  Physical Exam Vitals and nursing note reviewed.  Constitutional:      General: She is not in acute distress.    Appearance: She is well-developed.  HENT:     Head: Normocephalic and atraumatic.     Mouth/Throat:     Pharynx: No oropharyngeal exudate.  Eyes:     General: No scleral icterus.       Right eye: No discharge.        Left eye: No discharge.     Conjunctiva/sclera: Conjunctivae normal.     Pupils: Pupils are equal, round, and reactive to light.  Neck:     Thyroid: No thyromegaly.     Vascular: No JVD.  Cardiovascular:     Rate and Rhythm: Normal rate and regular rhythm.     Heart sounds: Normal heart sounds. No murmur heard.    No friction rub. No gallop.  Pulmonary:     Effort: Pulmonary effort is normal. No respiratory distress.     Breath sounds: Normal breath sounds. No wheezing or rales.  Abdominal:     General: Bowel sounds are normal. There is no distension.     Palpations: Abdomen is soft. There is no mass.     Tenderness:  There is no abdominal tenderness.  Musculoskeletal:        General: Swelling, tenderness and signs of injury present. No deformity.     Cervical back: Normal range of motion and neck supple.     Right lower leg: No edema.     Left lower leg: No edema.     Comments: Normal range of motion of the bilateral lower extremities at the major joints.  The patient does have mild ecchymosis on the dorsum of the left foot distally.  There is no obvious deformities, she has normal pulses and sensation  Lymphadenopathy:     Cervical: No cervical adenopathy.  Skin:    General: Skin is warm and dry.     Findings: No erythema or rash.  Neurological:     Mental Status: She is alert.     Coordination: Coordination normal.  Psychiatric:        Behavior: Behavior normal.     ED Results / Procedures / Treatments   Labs (all labs ordered are listed, but only abnormal results are displayed) Labs  Reviewed - No data to display  EKG None  Radiology DG Foot Complete Left  Result Date: 09/06/2022 CLINICAL DATA:  Left foot pain after fall several days ago. EXAM: LEFT FOOT - COMPLETE 3+ VIEW COMPARISON:  None Available. FINDINGS: Mildly displaced fractures are seen involving the distal portions of the left second, third and fourth metatarsals. Minimally displaced fracture is seen involving the proximal base of the first proximal phalanx with intra-articular extension. Mild posterior calcaneal spurring is noted. IMPRESSION: Mildly displaced fractures are seen involving the distal portions of the left second, third and fourth metatarsals. Minimally displaced fracture is seen involving proximal base of the first proximal phalanx. Electronically Signed   By: Lupita Raider M.D.   On: 09/06/2022 15:49    Procedures Procedures    Medications Ordered in ED Medications  oxyCODONE-acetaminophen (PERCOCET/ROXICET) 5-325 MG per tablet 1 tablet (has no administration in time range)    ED Course/ Medical Decision Making/ A&P                           Medical Decision Making Risk Prescription drug management.   I have reviewed the patient's medical record including prior visits to her family doctor most recently for neuropathy.  I have also reviewed and interpreted her x-ray which shows multiple minimally displaced distal metatarsal fractures and a proximal phalanx fracture.  I agree with the radiology interpretation.  I have given the patient something for pain and I have discussed her care with the orthopedic surgeon Dr. Jena Gauss, who recommends that she go home to follow-up in the office in 1 to 2 weeks and may be placed in a walking boot, to try to remain minimally weightbearing, likely a nonsurgical injury.  Family members at the bedside are agreeable with the plan as well.  1 tablet of Percocet given        Final Clinical Impression(s) / ED Diagnoses Final diagnoses:  None    Rx  / DC Orders ED Discharge Orders     None         Eber Hong, MD 09/06/22 [phone]

## 2022-09-06 NOTE — Discharge Instructions (Signed)
You have been diagnosed with several small fractures of your foot, according to the orthopedic surgeon these will likely be nonsurgical.  You are able to bear weight on your foot as long as that weight is on your heel.  It will cause some pain in your foot but try not to put your toes down.  I have also given you a handwritten prescription for a walker and a wheelchair to help you get around the house.  You will need to follow-up with Dr. Jena Gauss, see his phone number above, call the office in the morning to make a follow-up appointment for the next 10 to 14 days.  Return to the ER for severe worsening symptoms

## 2022-09-06 NOTE — ED Triage Notes (Signed)
Pt arrived POV from home c/o left foot and leg pain after having a fall Saturday. Pt states she is awaiting a hip replacement on the left and so sometimes it just gives out, but now she has had pain since Saturday after the fall.

## 2023-01-25 ENCOUNTER — Other Ambulatory Visit: Payer: Self-pay | Admitting: Specialist

## 2023-01-25 DIAGNOSIS — M25551 Pain in right hip: Secondary | ICD-10-CM

## 2023-02-04 ENCOUNTER — Other Ambulatory Visit (HOSPITAL_COMMUNITY): Payer: Self-pay | Admitting: Specialist

## 2023-02-04 DIAGNOSIS — M25551 Pain in right hip: Secondary | ICD-10-CM

## 2023-02-05 ENCOUNTER — Other Ambulatory Visit: Payer: Self-pay | Admitting: Specialist

## 2023-02-05 DIAGNOSIS — M25551 Pain in right hip: Secondary | ICD-10-CM

## 2023-02-14 ENCOUNTER — Ambulatory Visit (HOSPITAL_COMMUNITY)
Admission: RE | Admit: 2023-02-14 | Discharge: 2023-02-14 | Disposition: A | Discharge status: Home / Self Care | Payer: Medicare HMO | Source: Ambulatory Visit | Attending: Specialist | Admitting: Specialist

## 2023-02-14 DIAGNOSIS — M25551 Pain in right hip: Secondary | ICD-10-CM

## 2023-03-02 ENCOUNTER — Other Ambulatory Visit (HOSPITAL_COMMUNITY): Payer: Self-pay | Admitting: Specialist

## 2023-03-02 DIAGNOSIS — M5459 Other low back pain: Secondary | ICD-10-CM

## 2023-03-11 ENCOUNTER — Ambulatory Visit (HOSPITAL_COMMUNITY)
Admission: RE | Admit: 2023-03-11 | Discharge: 2023-03-11 | Disposition: A | Discharge status: Home / Self Care | Payer: Medicare HMO | Source: Ambulatory Visit | Attending: Specialist | Admitting: Specialist

## 2023-03-11 DIAGNOSIS — M5459 Other low back pain: Secondary | ICD-10-CM | POA: Diagnosis present

## 2023-03-12 ENCOUNTER — Other Ambulatory Visit (HOSPITAL_COMMUNITY): Payer: Medicare HMO

## 2023-03-15 ENCOUNTER — Other Ambulatory Visit (HOSPITAL_BASED_OUTPATIENT_CLINIC_OR_DEPARTMENT_OTHER): Payer: Medicare HMO

## 2023-03-29 ENCOUNTER — Other Ambulatory Visit: Payer: Self-pay | Admitting: Specialist

## 2023-03-29 DIAGNOSIS — M5451 Vertebrogenic low back pain: Secondary | ICD-10-CM

## 2023-04-06 ENCOUNTER — Ambulatory Visit
Admission: RE | Admit: 2023-04-06 | Discharge: 2023-04-06 | Disposition: A | Discharge status: Home / Self Care | Payer: Medicare HMO | Source: Ambulatory Visit | Attending: Specialist | Admitting: Specialist

## 2023-04-06 DIAGNOSIS — M5451 Vertebrogenic low back pain: Secondary | ICD-10-CM

## 2023-04-06 MED ORDER — IOPAMIDOL (ISOVUE-M 200) INJECTION 41%
1.0000 mL | Freq: Once | INTRAMUSCULAR | Status: AC
  Administered 2023-04-06: 1 mL via EPIDURAL

## 2023-04-06 MED ORDER — METHYLPREDNISOLONE ACETATE 40 MG/ML INJ SUSP (RADIOLOG
80.0000 mg | Freq: Once | INTRAMUSCULAR | Status: AC
  Administered 2023-04-06: 80 mg via EPIDURAL

## 2023-04-06 NOTE — Discharge Instructions (Signed)

## 2023-05-20 ENCOUNTER — Other Ambulatory Visit: Payer: Self-pay

## 2023-05-20 ENCOUNTER — Encounter (HOSPITAL_BASED_OUTPATIENT_CLINIC_OR_DEPARTMENT_OTHER): Payer: Self-pay | Admitting: Urology

## 2023-05-20 ENCOUNTER — Inpatient Hospital Stay (HOSPITAL_BASED_OUTPATIENT_CLINIC_OR_DEPARTMENT_OTHER)
Admission: EM | Admit: 2023-05-20 | Discharge: 2023-06-09 | DRG: 519 | Disposition: A | Discharge status: Skilled Nursing Facility | Payer: Medicare HMO | Attending: Internal Medicine | Admitting: Internal Medicine

## 2023-05-20 ENCOUNTER — Emergency Department (HOSPITAL_BASED_OUTPATIENT_CLINIC_OR_DEPARTMENT_OTHER): Payer: Medicare HMO

## 2023-05-20 DIAGNOSIS — Z90710 Acquired absence of both cervix and uterus: Secondary | ICD-10-CM

## 2023-05-20 DIAGNOSIS — E875 Hyperkalemia: Secondary | ICD-10-CM | POA: Insufficient documentation

## 2023-05-20 DIAGNOSIS — K828 Other specified diseases of gallbladder: Secondary | ICD-10-CM | POA: Diagnosis present

## 2023-05-20 DIAGNOSIS — M4802 Spinal stenosis, cervical region: Secondary | ICD-10-CM | POA: Diagnosis present

## 2023-05-20 DIAGNOSIS — S51011A Laceration without foreign body of right elbow, initial encounter: Secondary | ICD-10-CM | POA: Diagnosis present

## 2023-05-20 DIAGNOSIS — Z96653 Presence of artificial knee joint, bilateral: Secondary | ICD-10-CM | POA: Diagnosis present

## 2023-05-20 DIAGNOSIS — Z7985 Long-term (current) use of injectable non-insulin antidiabetic drugs: Secondary | ICD-10-CM

## 2023-05-20 DIAGNOSIS — I1310 Hypertensive heart and chronic kidney disease without heart failure, with stage 1 through stage 4 chronic kidney disease, or unspecified chronic kidney disease: Secondary | ICD-10-CM | POA: Diagnosis present

## 2023-05-20 DIAGNOSIS — T796XXA Traumatic ischemia of muscle, initial encounter: Secondary | ICD-10-CM | POA: Diagnosis present

## 2023-05-20 DIAGNOSIS — M4804 Spinal stenosis, thoracic region: Secondary | ICD-10-CM | POA: Diagnosis present

## 2023-05-20 DIAGNOSIS — R7401 Elevation of levels of liver transaminase levels: Secondary | ICD-10-CM | POA: Insufficient documentation

## 2023-05-20 DIAGNOSIS — Z794 Long term (current) use of insulin: Secondary | ICD-10-CM

## 2023-05-20 DIAGNOSIS — W06XXXA Fall from bed, initial encounter: Secondary | ICD-10-CM | POA: Diagnosis present

## 2023-05-20 DIAGNOSIS — Z7982 Long term (current) use of aspirin: Secondary | ICD-10-CM

## 2023-05-20 DIAGNOSIS — E860 Dehydration: Secondary | ICD-10-CM | POA: Diagnosis present

## 2023-05-20 DIAGNOSIS — Z6841 Body Mass Index (BMI) 40.0 and over, adult: Secondary | ICD-10-CM

## 2023-05-20 DIAGNOSIS — I2489 Other forms of acute ischemic heart disease: Secondary | ICD-10-CM | POA: Diagnosis present

## 2023-05-20 DIAGNOSIS — I440 Atrioventricular block, first degree: Secondary | ICD-10-CM | POA: Diagnosis present

## 2023-05-20 DIAGNOSIS — E049 Nontoxic goiter, unspecified: Secondary | ICD-10-CM | POA: Insufficient documentation

## 2023-05-20 DIAGNOSIS — S01111A Laceration without foreign body of right eyelid and periocular area, initial encounter: Secondary | ICD-10-CM | POA: Diagnosis present

## 2023-05-20 DIAGNOSIS — Y92003 Bedroom of unspecified non-institutional (private) residence as the place of occurrence of the external cause: Secondary | ICD-10-CM

## 2023-05-20 DIAGNOSIS — G8929 Other chronic pain: Secondary | ICD-10-CM | POA: Diagnosis present

## 2023-05-20 DIAGNOSIS — S230XXA Traumatic rupture of thoracic intervertebral disc, initial encounter: Principal | ICD-10-CM | POA: Diagnosis present

## 2023-05-20 DIAGNOSIS — I2510 Atherosclerotic heart disease of native coronary artery without angina pectoris: Secondary | ICD-10-CM | POA: Diagnosis present

## 2023-05-20 DIAGNOSIS — M6282 Rhabdomyolysis: Secondary | ICD-10-CM

## 2023-05-20 DIAGNOSIS — E8809 Other disorders of plasma-protein metabolism, not elsewhere classified: Secondary | ICD-10-CM | POA: Diagnosis present

## 2023-05-20 DIAGNOSIS — Z1152 Encounter for screening for COVID-19: Secondary | ICD-10-CM

## 2023-05-20 DIAGNOSIS — D631 Anemia in chronic kidney disease: Secondary | ICD-10-CM | POA: Diagnosis present

## 2023-05-20 DIAGNOSIS — R748 Abnormal levels of other serum enzymes: Secondary | ICD-10-CM

## 2023-05-20 DIAGNOSIS — I700 Atherosclerosis of aorta: Secondary | ICD-10-CM | POA: Diagnosis present

## 2023-05-20 DIAGNOSIS — R258 Other abnormal involuntary movements: Secondary | ICD-10-CM | POA: Diagnosis present

## 2023-05-20 DIAGNOSIS — K8000 Calculus of gallbladder with acute cholecystitis without obstruction: Secondary | ICD-10-CM | POA: Diagnosis present

## 2023-05-20 DIAGNOSIS — E785 Hyperlipidemia, unspecified: Secondary | ICD-10-CM | POA: Insufficient documentation

## 2023-05-20 DIAGNOSIS — W19XXXA Unspecified fall, initial encounter: Principal | ICD-10-CM

## 2023-05-20 DIAGNOSIS — Z791 Long term (current) use of non-steroidal anti-inflammatories (NSAID): Secondary | ICD-10-CM

## 2023-05-20 DIAGNOSIS — E119 Type 2 diabetes mellitus without complications: Secondary | ICD-10-CM

## 2023-05-20 DIAGNOSIS — I447 Left bundle-branch block, unspecified: Secondary | ICD-10-CM | POA: Diagnosis present

## 2023-05-20 DIAGNOSIS — E1122 Type 2 diabetes mellitus with diabetic chronic kidney disease: Secondary | ICD-10-CM | POA: Diagnosis present

## 2023-05-20 DIAGNOSIS — N179 Acute kidney failure, unspecified: Secondary | ICD-10-CM

## 2023-05-20 DIAGNOSIS — R7989 Other specified abnormal findings of blood chemistry: Secondary | ICD-10-CM | POA: Diagnosis present

## 2023-05-20 DIAGNOSIS — D72829 Elevated white blood cell count, unspecified: Secondary | ICD-10-CM | POA: Insufficient documentation

## 2023-05-20 DIAGNOSIS — N189 Chronic kidney disease, unspecified: Secondary | ICD-10-CM

## 2023-05-20 DIAGNOSIS — E872 Acidosis, unspecified: Secondary | ICD-10-CM | POA: Diagnosis not present

## 2023-05-20 DIAGNOSIS — E1142 Type 2 diabetes mellitus with diabetic polyneuropathy: Secondary | ICD-10-CM | POA: Diagnosis present

## 2023-05-20 DIAGNOSIS — Z79899 Other long term (current) drug therapy: Secondary | ICD-10-CM

## 2023-05-20 DIAGNOSIS — N1832 Chronic kidney disease, stage 3b: Secondary | ICD-10-CM | POA: Diagnosis present

## 2023-05-20 DIAGNOSIS — I10 Essential (primary) hypertension: Secondary | ICD-10-CM | POA: Insufficient documentation

## 2023-05-20 DIAGNOSIS — R17 Unspecified jaundice: Secondary | ICD-10-CM | POA: Diagnosis present

## 2023-05-20 LAB — CBC WITH DIFFERENTIAL/PLATELET
Abs Immature Granulocytes: 0.04 10*3/uL (ref 0.00–0.07)
Basophils Absolute: 0 10*3/uL (ref 0.0–0.1)
Basophils Relative: 0 %
Eosinophils Absolute: 0 10*3/uL (ref 0.0–0.5)
Eosinophils Relative: 0 %
HCT: 30 % — ABNORMAL LOW (ref 36.0–46.0)
Hemoglobin: 10.2 g/dL — ABNORMAL LOW (ref 12.0–15.0)
Immature Granulocytes: 0 %
Lymphocytes Relative: 13 %
Lymphs Abs: 1.6 10*3/uL (ref 0.7–4.0)
MCH: 30.4 pg (ref 26.0–34.0)
MCHC: 34 g/dL (ref 30.0–36.0)
MCV: 89.6 fL (ref 80.0–100.0)
Monocytes Absolute: 0.7 10*3/uL (ref 0.1–1.0)
Monocytes Relative: 6 %
Neutro Abs: 9.5 10*3/uL — ABNORMAL HIGH (ref 1.7–7.7)
Neutrophils Relative %: 81 %
Platelets: 170 10*3/uL (ref 150–400)
RBC: 3.35 MIL/uL — ABNORMAL LOW (ref 3.87–5.11)
RDW: 14.1 % (ref 11.5–15.5)
WBC: 12 10*3/uL — ABNORMAL HIGH (ref 4.0–10.5)
nRBC: 0 % (ref 0.0–0.2)

## 2023-05-20 LAB — COMPREHENSIVE METABOLIC PANEL
ALT: 23 U/L (ref 0–44)
AST: 71 U/L — ABNORMAL HIGH (ref 15–41)
Albumin: 3.4 g/dL — ABNORMAL LOW (ref 3.5–5.0)
Alkaline Phosphatase: 34 U/L — ABNORMAL LOW (ref 38–126)
Anion gap: 11 (ref 5–15)
BUN: 34 mg/dL — ABNORMAL HIGH (ref 8–23)
CO2: 25 mmol/L (ref 22–32)
Calcium: 9.2 mg/dL (ref 8.9–10.3)
Chloride: 106 mmol/L (ref 98–111)
Creatinine, Ser: 1.98 mg/dL — ABNORMAL HIGH (ref 0.44–1.00)
GFR, Estimated: 25 mL/min — ABNORMAL LOW (ref >60)
Glucose, Bld: 210 mg/dL — ABNORMAL HIGH (ref 70–99)
Potassium: 4.8 mmol/L (ref 3.5–5.1)
Sodium: 142 mmol/L (ref 135–145)
Total Bilirubin: 1.4 mg/dL — ABNORMAL HIGH (ref 0.3–1.2)
Total Protein: 6.7 g/dL (ref 6.5–8.1)

## 2023-05-20 LAB — CK: Total CK: 2412 U/L — ABNORMAL HIGH (ref 38–234)

## 2023-05-20 LAB — TROPONIN I (HIGH SENSITIVITY): Troponin I (High Sensitivity): 31 ng/L — ABNORMAL HIGH (ref <18)

## 2023-05-20 LAB — LIPASE, BLOOD: Lipase: 27 U/L (ref 11–51)

## 2023-05-20 LAB — SARS CORONAVIRUS 2 BY RT PCR: SARS Coronavirus 2 by RT PCR: NEGATIVE

## 2023-05-20 MED ORDER — LACTATED RINGERS IV SOLN: INTRAVENOUS | Status: DC

## 2023-05-20 MED ORDER — SODIUM CHLORIDE 0.9 % IV BOLUS
500.0000 mL | Freq: Once | INTRAVENOUS | Status: AC
  Administered 2023-05-20: 500 mL via INTRAVENOUS

## 2023-05-20 NOTE — ED Provider Notes (Signed)
Emergency Department Provider Note   I have reviewed the triage vital signs and the nursing notes.   HISTORY  Chief Complaint Fall   HPI Katherine Cook is a 82 y.o. female with PMH of DM, CKD, and chronic joint pain presents to the ED for evaluation after a fall this morning.  Patient was trying to get out of bed stepping forward when she lost her balance falling to the due to weakness and pain she was unable to get up.  She does wear a medical alert necklace and tried to activate that but no one came for help.  She was unable to contact additional help and lay on the floor until approximately 7 PM today when she was found by family.  She had some numbness in the left leg after lying on it for most of the day but that seems to have resolved.  She has chronic pain in the right hip.  Denies any chest pain/palpitation/shortness of breath prior to falling. No numbness in the left arm/face. Patient feels generally weak without unilateral weakness.    Past Medical History:  Diagnosis Date   Diabetes mellitus without complication (HCC)     Review of Systems  Constitutional: No fever/chills. Positive generalized weakness.  Cardiovascular: Denies chest pain. Respiratory: Denies shortness of breath. Gastrointestinal: No abdominal pain.  No nausea, no vomiting.  No diarrhea.  No constipation. Genitourinary: Negative for dysuria. Musculoskeletal: Chronic back and right hip pain.  Skin: Negative for rash. Neurological: Negative for headaches. Left leg numbness (resolved).   ____________________________________________   PHYSICAL EXAM:  VITAL SIGNS: ED Triage Vitals  Encounter Vitals Group     BP 05/20/23 2119 (!) 125/59     Pulse Rate 05/20/23 2119 84     Resp 05/20/23 2119 20     Temp 05/20/23 2119 (!) 100.8 F (38.2 C)     Temp Source 05/20/23 2119 Oral     SpO2 05/20/23 2119 91 %     Weight 05/20/23 2117 279 lb 1.6 oz (126.6 kg)     Height 05/20/23 2117 5\' 10"  (1.778 m)    Constitutional: Alert and oriented. Well appearing and in no acute distress. Eyes: Conjunctivae are normal. PERRL. EOMI. Periorbital ecchymosis surrounding the right eye with 1 cm laceration to the right eyebrow.  Head: Atraumatic. Nose: No congestion/rhinnorhea. Mouth/Throat: Mucous membranes are moist.  Neck: No stridor.  No cervical spine tenderness to palpation. Cardiovascular: Normal rate, regular rhythm. Good peripheral circulation. Grossly normal heart sounds.   Respiratory: Normal respiratory effort.  No retractions. Lungs CTAB. Gastrointestinal: Soft and nontender. No distention.  Musculoskeletal: No lower extremity tenderness nor edema. No gross deformities of extremities. No shortening or rotation.  Neurologic:  Normal speech and language. No gross focal neurologic deficits are appreciated.  Skin:  Skin is warm, dry and intact. No rash noted. No areas of skin breakdown or pressure wounds appreciated.    ____________________________________________   LABS (all labs ordered are listed, but only abnormal results are displayed)  Labs Reviewed  SARS CORONAVIRUS 2 BY RT PCR  COMPREHENSIVE METABOLIC PANEL  CBC WITH DIFFERENTIAL/PLATELET  CK  URINALYSIS, ROUTINE W REFLEX MICROSCOPIC  LIPASE, BLOOD  TROPONIN I (HIGH SENSITIVITY)   ____________________________________________  EKG   EKG Interpretation Date/Time:  Friday May 20 2023 21:37:22 EDT Ventricular Rate:  84 PR Interval:  217 QRS Duration:  152 QT Interval:  422 QTC Calculation: 499 R Axis:   25  Text Interpretation: Sinus rhythm Borderline prolonged PR interval Left  bundle branch block Confirmed by Alona Bene [phone]) on 05/20/2023 9:43:06 PM        ____________________________________________  RADIOLOGY  No results found.  ____________________________________________   PROCEDURES  Procedure(s) performed:   Procedures   ____________________________________________   INITIAL IMPRESSION  / ASSESSMENT AND PLAN / ED COURSE  Pertinent labs & imaging results that were available during my care of the patient were reviewed by me and considered in my medical decision making (see chart for details).   This patient is Presenting for Evaluation of fall, which does require a range of treatment options, and is a complaint that involves a high risk of morbidity and mortality.  The Differential Diagnoses includes subdural hematoma, epidural hematoma, acute concussion, traumatic subarachnoid hemorrhage, cerebral contusions, etc.   Critical Interventions-    Medications  sodium chloride 0.9 % bolus 500 mL (500 mLs Intravenous New Bag/Given 05/20/23 2141)    Reassessment after intervention:     I did obtain Additional Historical Information from daughter at bedside.    Clinical Laboratory Tests Ordered, included ***  Radiologic Tests Ordered, included ***. I independently interpreted the images and agree with radiology interpretation.   Cardiac Monitor Tracing which shows NSR.    Social Determinants of Health Risk patient is a non-smoker.   Consult complete with  Medical Decision Making: Summary:  Patient presents emergency department with mechanical fall at home this morning but then was unable to get up on her own power and stayed on the ground for multiple hours throughout the day.  No pressure wounds.  No clear traumatic findings.  Below ecchymosis to the right eye.  No vision changes or signs of entrapment.  She does have fever on arrival but no SIRS vitals. Doubt sepsis clinically but will evaluate further for a fever source.   Reevaluation with update and discussion with   ***Considered admission***  Patient's presentation is most consistent with acute presentation with potential threat to life or bodily function.   Disposition:   ____________________________________________  FINAL CLINICAL IMPRESSION(S) / ED DIAGNOSES  Final diagnoses:  None     NEW OUTPATIENT  MEDICATIONS STARTED DURING THIS VISIT:  New Prescriptions   No medications on file    Note:  This document was prepared using Dragon voice recognition software and may include unintentional dictation errors.  Alona Bene, MD, [facility] Emergency Medicine

## 2023-05-20 NOTE — Progress Notes (Incomplete)
Hospitalist Transfer Note:    Nursing staff, Please call [facility] Admits & Consults System-Wide number on Amion [phone]) as soon as patient's arrival, so appropriate admitting provider can evaluate the pt.   Transferring facility: [facility] Requesting provider: Dr. Jacqulyn Bath (EDP at [facility]) Reason for transfer: admission for further evaluation and management of aki or ckd 3b, dehydration.  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.5, who is being transferred from [facility] to [facility] or [facility]  (first available) for acute kidney injury superimposed on CKD 3B associated with dehydration, mildly elevated CPK, after prolonged downtime on the floor after ground-level mechanical fall earlier in the day; of note, there was question of potential acute right femoral neck fracture it was noted on CT chest, abdomen, pelvis; EDP currently pursuing additional imaging of the right hip, and will reach out to orthopedic surgery if this ensuing imaging is suggestive of acute fracture; please see my progress note for additional details).     ***  year old *** with medical history notable for ***, who presented to *** ED complaining of  ***.   Vital signs in the ED were notable for the following: ***  Labs were notable for ***  Imaging notable for ***  Medications administered prior to transfer included the following: ***    Subsequently, I accepted this patient for transfer for ***observation/inpatient admission to a *** bed at *** for further work-up and management of the above*** .   ***Reason/necessity for transfer on the basis of need for higher level of care and availability of specialist providers        Newton Pigg, DO Hospitalist

## 2023-05-20 NOTE — Progress Notes (Signed)
Hospitalist Transfer Note:    Nursing staff, Please call [facility] Admits & Consults System-Wide number on Amion [phone]) as soon as patient's arrival, so appropriate admitting provider can evaluate the pt.   Transferring facility: [facility] Requesting provider: Dr. Jacqulyn Bath (EDP at [facility]) Reason for transfer: admission for further evaluation and management of aki or ckd 3b, dehydration.      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   The patient, who lives at home by herself, reports that she tripped while ambulating at home around 7 AM on 05/20/2023.  Denies any associated loss of consciousness, nor any preceding dizziness, lightheadedness.  She did hit her head as a component of this fall, but is not on any blood thinners at home.  in the setting of generalized weakness and hip discomfort, was unable to get up off of the floor.  Consequently, she remained on the floor for approximately 12 hours until her family found her around 7 PM on 05/20/2023, at which time they were able to assist her into their private vehicle and bring her to [facility] ED for further evaluation and management of the above.  She had an initially mildly elevated temperature 100.8, which spontaneously improved to 98 without interval acetaminophen or NSAIDs.  Denies any subjective fever, chills and or any other associated infectious symptoms.  Relative to her baseline creatinine range of 1.4-1.5, her presenting creatinine was approximately 1.9.  She also has a mildly elevated CPK level of 2400.  CT head, CT cervical spine, CT maxillofacial, showed no evidence of acute fracture.   CT chest, abdomen, pelvis showed questionable acute right femoral neck fracture.  Of note, EDP is pursuing  additional imaging of the right hip,  and will reach out to orthopedic surgery if this ensuing imaging is suggestive of acute fracture.    Subsequently, I accepted this patient for transfer for observation to a med tele bed at [facility] (first available) for further work-up and management of the above.       Newton Pigg, DO Hospitalist

## 2023-05-20 NOTE — ED Triage Notes (Signed)
Pt fell this am around 0700 when trying to get up out of the bed and laid on floor until approx 1900.  States was laying on left side and now has some left sided numbness and weakness  Pt in soaked depends and blood noted to shirt  Small lac noted to right eyebrow with bruising  States pain in left hip and down leg  Not on thinners  A&O x 4, GCS 15

## 2023-05-21 ENCOUNTER — Observation Stay (HOSPITAL_COMMUNITY): Payer: Medicare HMO

## 2023-05-21 ENCOUNTER — Encounter (HOSPITAL_COMMUNITY): Payer: Self-pay | Admitting: Internal Medicine

## 2023-05-21 ENCOUNTER — Inpatient Hospital Stay (HOSPITAL_COMMUNITY): Payer: Medicare HMO

## 2023-05-21 DIAGNOSIS — M6282 Rhabdomyolysis: Secondary | ICD-10-CM | POA: Diagnosis not present

## 2023-05-21 DIAGNOSIS — E872 Acidosis, unspecified: Secondary | ICD-10-CM | POA: Diagnosis not present

## 2023-05-21 DIAGNOSIS — N1831 Chronic kidney disease, stage 3a: Secondary | ICD-10-CM | POA: Diagnosis not present

## 2023-05-21 DIAGNOSIS — I1310 Hypertensive heart and chronic kidney disease without heart failure, with stage 1 through stage 4 chronic kidney disease, or unspecified chronic kidney disease: Secondary | ICD-10-CM | POA: Diagnosis present

## 2023-05-21 DIAGNOSIS — E1142 Type 2 diabetes mellitus with diabetic polyneuropathy: Secondary | ICD-10-CM | POA: Diagnosis present

## 2023-05-21 DIAGNOSIS — W19XXXA Unspecified fall, initial encounter: Secondary | ICD-10-CM | POA: Diagnosis not present

## 2023-05-21 DIAGNOSIS — I440 Atrioventricular block, first degree: Secondary | ICD-10-CM | POA: Diagnosis present

## 2023-05-21 DIAGNOSIS — Z794 Long term (current) use of insulin: Secondary | ICD-10-CM | POA: Diagnosis not present

## 2023-05-21 DIAGNOSIS — E860 Dehydration: Secondary | ICD-10-CM | POA: Diagnosis present

## 2023-05-21 DIAGNOSIS — E785 Hyperlipidemia, unspecified: Secondary | ICD-10-CM | POA: Diagnosis present

## 2023-05-21 DIAGNOSIS — E049 Nontoxic goiter, unspecified: Secondary | ICD-10-CM

## 2023-05-21 DIAGNOSIS — I700 Atherosclerosis of aorta: Secondary | ICD-10-CM | POA: Diagnosis present

## 2023-05-21 DIAGNOSIS — D631 Anemia in chronic kidney disease: Secondary | ICD-10-CM | POA: Diagnosis present

## 2023-05-21 DIAGNOSIS — E039 Hypothyroidism, unspecified: Secondary | ICD-10-CM | POA: Diagnosis not present

## 2023-05-21 DIAGNOSIS — I447 Left bundle-branch block, unspecified: Secondary | ICD-10-CM | POA: Diagnosis present

## 2023-05-21 DIAGNOSIS — R748 Abnormal levels of other serum enzymes: Secondary | ICD-10-CM

## 2023-05-21 DIAGNOSIS — D72829 Elevated white blood cell count, unspecified: Secondary | ICD-10-CM | POA: Diagnosis not present

## 2023-05-21 DIAGNOSIS — R7401 Elevation of levels of liver transaminase levels: Secondary | ICD-10-CM

## 2023-05-21 DIAGNOSIS — Z6841 Body Mass Index (BMI) 40.0 and over, adult: Secondary | ICD-10-CM | POA: Diagnosis not present

## 2023-05-21 DIAGNOSIS — R7989 Other specified abnormal findings of blood chemistry: Secondary | ICD-10-CM | POA: Diagnosis present

## 2023-05-21 DIAGNOSIS — M5410 Radiculopathy, site unspecified: Secondary | ICD-10-CM

## 2023-05-21 DIAGNOSIS — R17 Unspecified jaundice: Secondary | ICD-10-CM | POA: Diagnosis present

## 2023-05-21 DIAGNOSIS — Y92003 Bedroom of unspecified non-institutional (private) residence as the place of occurrence of the external cause: Secondary | ICD-10-CM | POA: Diagnosis not present

## 2023-05-21 DIAGNOSIS — E8809 Other disorders of plasma-protein metabolism, not elsewhere classified: Secondary | ICD-10-CM | POA: Diagnosis present

## 2023-05-21 DIAGNOSIS — E1122 Type 2 diabetes mellitus with diabetic chronic kidney disease: Secondary | ICD-10-CM | POA: Diagnosis present

## 2023-05-21 DIAGNOSIS — E119 Type 2 diabetes mellitus without complications: Secondary | ICD-10-CM

## 2023-05-21 DIAGNOSIS — I129 Hypertensive chronic kidney disease with stage 1 through stage 4 chronic kidney disease, or unspecified chronic kidney disease: Secondary | ICD-10-CM | POA: Diagnosis not present

## 2023-05-21 DIAGNOSIS — G8929 Other chronic pain: Secondary | ICD-10-CM | POA: Diagnosis present

## 2023-05-21 DIAGNOSIS — S230XXA Traumatic rupture of thoracic intervertebral disc, initial encounter: Secondary | ICD-10-CM | POA: Diagnosis present

## 2023-05-21 DIAGNOSIS — Z1152 Encounter for screening for COVID-19: Secondary | ICD-10-CM | POA: Diagnosis not present

## 2023-05-21 DIAGNOSIS — M4804 Spinal stenosis, thoracic region: Secondary | ICD-10-CM | POA: Diagnosis present

## 2023-05-21 DIAGNOSIS — N179 Acute kidney failure, unspecified: Secondary | ICD-10-CM | POA: Diagnosis present

## 2023-05-21 DIAGNOSIS — M5104 Intervertebral disc disorders with myelopathy, thoracic region: Secondary | ICD-10-CM | POA: Diagnosis not present

## 2023-05-21 DIAGNOSIS — R55 Syncope and collapse: Secondary | ICD-10-CM

## 2023-05-21 DIAGNOSIS — R509 Fever, unspecified: Secondary | ICD-10-CM | POA: Diagnosis not present

## 2023-05-21 DIAGNOSIS — I10 Essential (primary) hypertension: Secondary | ICD-10-CM | POA: Insufficient documentation

## 2023-05-21 DIAGNOSIS — K8000 Calculus of gallbladder with acute cholecystitis without obstruction: Secondary | ICD-10-CM | POA: Diagnosis present

## 2023-05-21 DIAGNOSIS — E875 Hyperkalemia: Secondary | ICD-10-CM | POA: Diagnosis not present

## 2023-05-21 DIAGNOSIS — N1832 Chronic kidney disease, stage 3b: Secondary | ICD-10-CM | POA: Diagnosis present

## 2023-05-21 DIAGNOSIS — N189 Chronic kidney disease, unspecified: Secondary | ICD-10-CM

## 2023-05-21 DIAGNOSIS — W06XXXA Fall from bed, initial encounter: Secondary | ICD-10-CM | POA: Diagnosis present

## 2023-05-21 DIAGNOSIS — I2489 Other forms of acute ischemic heart disease: Secondary | ICD-10-CM | POA: Diagnosis present

## 2023-05-21 DIAGNOSIS — T796XXA Traumatic ischemia of muscle, initial encounter: Secondary | ICD-10-CM | POA: Diagnosis present

## 2023-05-21 DIAGNOSIS — Z01810 Encounter for preprocedural cardiovascular examination: Secondary | ICD-10-CM | POA: Diagnosis not present

## 2023-05-21 LAB — T4, FREE: Free T4: 1.03 ng/dL (ref 0.61–1.12)

## 2023-05-21 LAB — CBC
HCT: 27.9 % — ABNORMAL LOW (ref 36.0–46.0)
Hemoglobin: 9.2 g/dL — ABNORMAL LOW (ref 12.0–15.0)
MCH: 29.5 pg (ref 26.0–34.0)
MCHC: 33 g/dL (ref 30.0–36.0)
MCV: 89.4 fL (ref 80.0–100.0)
Platelets: 158 10*3/uL (ref 150–400)
RBC: 3.12 MIL/uL — ABNORMAL LOW (ref 3.87–5.11)
RDW: 14.2 % (ref 11.5–15.5)
WBC: 10.1 10*3/uL (ref 4.0–10.5)
nRBC: 0 % (ref 0.0–0.2)

## 2023-05-21 LAB — CK: Total CK: 2208 U/L — ABNORMAL HIGH (ref 38–234)

## 2023-05-21 LAB — HEPATITIS PANEL, ACUTE
HCV Ab: NONREACTIVE
Hep A IgM: NONREACTIVE
Hep B C IgM: NONREACTIVE
Hepatitis B Surface Ag: NONREACTIVE

## 2023-05-21 LAB — TROPONIN I (HIGH SENSITIVITY)
Troponin I (High Sensitivity): 37 ng/L — ABNORMAL HIGH (ref <18)
Troponin I (High Sensitivity): 33 ng/L — ABNORMAL HIGH (ref <18)

## 2023-05-21 LAB — COMPREHENSIVE METABOLIC PANEL
ALT: 23 U/L (ref 0–44)
AST: 71 U/L — ABNORMAL HIGH (ref 15–41)
Albumin: 2.9 g/dL — ABNORMAL LOW (ref 3.5–5.0)
Alkaline Phosphatase: 30 U/L — ABNORMAL LOW (ref 38–126)
Anion gap: 11 (ref 5–15)
BUN: 28 mg/dL — ABNORMAL HIGH (ref 8–23)
CO2: 25 mmol/L (ref 22–32)
Calcium: 9 mg/dL (ref 8.9–10.3)
Chloride: 107 mmol/L (ref 98–111)
Creatinine, Ser: 2.12 mg/dL — ABNORMAL HIGH (ref 0.44–1.00)
GFR, Estimated: 23 mL/min — ABNORMAL LOW (ref >60)
Glucose, Bld: 128 mg/dL — ABNORMAL HIGH (ref 70–99)
Potassium: 4.6 mmol/L (ref 3.5–5.1)
Sodium: 143 mmol/L (ref 135–145)
Total Bilirubin: 1.2 mg/dL (ref 0.3–1.2)
Total Protein: 6.3 g/dL — ABNORMAL LOW (ref 6.5–8.1)

## 2023-05-21 LAB — GLUCOSE, CAPILLARY
Glucose-Capillary: 119 mg/dL — ABNORMAL HIGH (ref 70–99)
Glucose-Capillary: 126 mg/dL — ABNORMAL HIGH (ref 70–99)

## 2023-05-21 LAB — ECHOCARDIOGRAM COMPLETE
Area-P 1/2: 3.12 cm2
Height: 70 in
S' Lateral: 3.2 cm
Weight: 4310.43 oz

## 2023-05-21 LAB — HEMOGLOBIN A1C
Hgb A1c MFr Bld: 6.4 % — ABNORMAL HIGH (ref 4.8–5.6)
Mean Plasma Glucose: 136.98 mg/dL

## 2023-05-21 LAB — SODIUM, URINE, RANDOM: Sodium, Ur: 79 mmol/L

## 2023-05-21 LAB — CREATININE, URINE, RANDOM: Creatinine, Urine: 86 mg/dL

## 2023-05-21 LAB — TSH: TSH: 2.114 u[IU]/mL (ref 0.350–4.500)

## 2023-05-21 MED ORDER — ONDANSETRON HCL 4 MG PO TABS: 4.0000 mg | ORAL_TABLET | Freq: Four times a day (QID) | ORAL | Status: DC | PRN

## 2023-05-21 MED ORDER — HYDROCODONE-ACETAMINOPHEN 5-325 MG PO TABS
1.0000 | ORAL_TABLET | Freq: Four times a day (QID) | ORAL | Status: DC | PRN
  Administered 2023-05-27 (×2): 1 via ORAL
  Administered 2023-05-28 – 2023-05-30 (×8): 2 via ORAL
  Administered 2023-05-31 – 2023-06-01 (×3): 1 via ORAL
  Administered 2023-06-02 – 2023-06-09 (×18): 2 via ORAL
  Administered 2023-06-09: 1 via ORAL
  Administered 2023-06-09: 2 via ORAL
  Filled 2023-05-21: qty 1
  Filled 2023-05-21: qty 2
  Filled 2023-05-21: qty 1
  Filled 2023-05-21 (×10): qty 2
  Filled 2023-05-21: qty 1
  Filled 2023-05-21 (×2): qty 2
  Filled 2023-05-21: qty 1
  Filled 2023-05-21 (×10): qty 2
  Filled 2023-05-21: qty 1
  Filled 2023-05-21 (×5): qty 2

## 2023-05-21 MED ORDER — SODIUM CHLORIDE 0.9% FLUSH: 3.0000 mL | INTRAVENOUS | Status: DC | PRN

## 2023-05-21 MED ORDER — ACETAMINOPHEN 325 MG PO TABS
650.0000 mg | ORAL_TABLET | Freq: Four times a day (QID) | ORAL | Status: DC | PRN
  Administered 2023-05-22 – 2023-05-31 (×4): 650 mg via ORAL
  Filled 2023-05-21 (×4): qty 2

## 2023-05-21 MED ORDER — ACETAMINOPHEN 325 MG PO TABS
650.0000 mg | ORAL_TABLET | Freq: Once | ORAL | Status: AC
  Administered 2023-05-21: 650 mg via ORAL
  Filled 2023-05-21: qty 2

## 2023-05-21 MED ORDER — ONDANSETRON HCL 4 MG/2ML IJ SOLN: 4.0000 mg | Freq: Four times a day (QID) | INTRAMUSCULAR | Status: DC | PRN

## 2023-05-21 MED ORDER — HEPARIN SODIUM (PORCINE) 5000 UNIT/ML IJ SOLN
5000.0000 [IU] | Freq: Three times a day (TID) | INTRAMUSCULAR | Status: AC
  Administered 2023-05-21 – 2023-05-26 (×17): 5000 [IU] via SUBCUTANEOUS
  Filled 2023-05-21 (×17): qty 1

## 2023-05-21 MED ORDER — LACTATED RINGERS IV SOLN: INTRAVENOUS | Status: DC

## 2023-05-21 MED ORDER — INSULIN ASPART 100 UNIT/ML IJ SOLN
0.0000 [IU] | Freq: Three times a day (TID) | INTRAMUSCULAR | Status: DC
  Administered 2023-05-24: 1 [IU] via SUBCUTANEOUS
  Administered 2023-05-27: 2 [IU] via SUBCUTANEOUS
  Administered 2023-05-28 – 2023-06-05 (×7): 1 [IU] via SUBCUTANEOUS

## 2023-05-21 MED ORDER — ACETAMINOPHEN 650 MG RE SUPP: 650.0000 mg | Freq: Four times a day (QID) | RECTAL | Status: DC | PRN

## 2023-05-21 MED ORDER — HYDRALAZINE HCL 20 MG/ML IJ SOLN: 5.0000 mg | Freq: Four times a day (QID) | INTRAMUSCULAR | Status: DC | PRN

## 2023-05-21 MED ORDER — SENNOSIDES-DOCUSATE SODIUM 8.6-50 MG PO TABS
1.0000 | ORAL_TABLET | Freq: Every evening | ORAL | Status: DC | PRN
  Administered 2023-06-02 – 2023-06-05 (×4): 1 via ORAL
  Filled 2023-05-21 (×6): qty 1

## 2023-05-21 MED ORDER — SODIUM CHLORIDE 0.9% FLUSH
3.0000 mL | Freq: Two times a day (BID) | INTRAVENOUS | Status: DC
  Administered 2023-05-21 – 2023-06-09 (×34): 3 mL via INTRAVENOUS

## 2023-05-21 MED ORDER — SODIUM CHLORIDE 0.9 % IV SOLN: 250.0000 mL | INTRAVENOUS | Status: DC | PRN

## 2023-05-21 NOTE — Plan of Care (Signed)
  Problem: Activity: Goal: Risk for activity intolerance will decrease Outcome: Not Progressing   Problem: Safety: Goal: Ability to remain free from injury will improve Outcome: Not Progressing   

## 2023-05-21 NOTE — Hospital Course (Addendum)
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  When she arrived to the ED she was febrile 100.8 and labs showed an AKI as well as abnormal LFTs and rhabdomyolysis as well as subsequent leukocytosis.  Imaging was done and she had a head CT, CT C-spine, CT maxillofacial CTA as well as CT of the chest abdomen pelvis which showed no acute finding but did show a thyroid abnormality and ultrasound was recommended.  She was also found to have a nondisplaced femoral neck fracture however a right femoral neck 3 view x-ray was recommended but did not show any fracture.  She subsequently admitted for rhabdomyolysis and AKI on CKD stage IIIb and placed on IV fluids.  Given this she also had noted that she had some cystic changes in the left foot back in November after immobilizer was placed for traumatic left foot fracture and had progressed to the entire leg with worsening and left lower extremity weakness.  MRI was done and showed no evidence of acute stroke but she was seen by neurology underwent MRI of C-spine and T-spine on the MRI thoracic spine showed disc herniation with cord signal changes in the cervical stenosis with cord signal change.  Neurosurgery was consulted and recommended thoracic discectomy.  She underwent T11-T12 transpedicular thoracic discectomy with Dr. Johnsie Cancel on 05/27/2023.    PT OT recommending SNF however her WBC is now remains elevated and will need to ensure a trend back down prior to safe  discharging.  Further workup has been done and currently blood cultures are negative and likely reactive but upon further review she had a Temperature on 8/18.  LFTs also trended up now again so we will check an acute hepatitis panel and a right quad ultrasound and it showed "Distended gallbladder with wall thickening. Shadowing stones and sludge. Please correlate with any clinical evidence of acute cholecystitis. If there is further concern of acute cholecystitis, HIDA scan may be useful for further delineation. No separate biliary ductal dilatation."  Given her findings on the right upper quadrant ultrasound will get a HIDA scan and involve general surgery and initiate antibiotics given concern for possible acute cholecystitis.  If cholecystitis is ruled out surgery will sign off however if it is ruled and they plan for surgical intervention likely a laparoscopic cholecystectomy as early as Saturday or Sunday.  Given this antibiotics were initiated.  There was no acute cholecystitis noted on the HIDA scan and WBC continue to be slightly elevated.  Anticipate discharge on Monday.  Assessment and Plan:  Acute Kidney Injury superimposed on CKD stage 3b, improving Metabolic Acidosis -BUN/Cr Trend: Recent Labs  Lab 05/29/23 0543 05/30/23 0418 06/01/23 0155 06/01/23 1610 06/02/23 0022 06/03/23 0617 06/04/23 0404  BUN 34* 36* 34* 35* 35* 31* 28*  CREATININE 2.27* 2.24* 1.77* 1.83* 1.73* 1.85* 1.64*  -Avoid Nephrotoxic Medications, Contrast Dyes, Hypotension and Dehydration to Ensure Adequate Renal Perfusion and will need to Renally Adjust Meds -Patient had a slight metabolic acidosis with a CO2 of 21, anion gap  of 9, chloride level of 111; Patient's Acidosis has improved and CO2 is 25, anion gap is 9, chloride 108 -Continue to Monitor and Trend Renal Function carefully and repeat CMP in the AM  -Encourage oral fluid intake and continue to Follow Urine output Avoid nephrotoxic agents, follow-up urine  output   Mild Hyperkalemia, improved -Resolved with Lokelma -Patient's K+ Level Trend: Recent Labs  Lab 05/29/23 0543 05/30/23 0418 06/01/23 0155 06/01/23 1610 06/02/23 0022 06/03/23 0617 06/04/23 0404  K 5.2* 4.7 4.4 4.8 4.6 4.7 4.9  -Continue to Monitor and Replete as Necessary -Repeat CMP in the AM    Rhabdomyolysis status post fall and prolonged pressure injury. -CPK levels trending down, repeat CK 246.  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 Maurice Small on 05/27/23 -Pain control as needed -Postop care per neurosurgery recommendations-will resume Lovenox later as recommended by neurosurgery -As per neurosurgery--will likely need rehab  -PT/OT on board and recommending SNF and anticipating discharge on Monday   Chest Pain -Resolved. -EKG showed normal sinus rhythm heart rate 76.  -No ST and T wave abnormality. Troponin 16.  -Denies any chest pain.  -Ruled out ACS.    Essential Hypertension, Moderately controlled -C/w IV Hydralazine 5 mg q6hprn SBP >160 -Continue to Monitor BP per Protocol -Last BP reading was elevated at 142/61   Hyperlipidemia -Rosuvastatin was initially held due to rhabdomyolysis and Abnormal LFTs -Initially Resumed home statin with Rosuvastatin 40 mg po Daily but holding again given Abnormal LFTs   Insulin Dependent Type 2 Diabetes Mellitus (HCC) -Hemoglobin A1c 6.4 -C/w Carbohydrate modified diet. -Discontinued Levemir 10 units twice daily -C/w Very Sensitive Novolog SSI AC -Continue to Monitor and Trend CBG's and Glucose Levels: Recent Labs  Lab 06/02/23 1639 06/02/23 2026 06/03/23 0737 06/03/23 1750 06/03/23 2114 06/04/23 0742 06/04/23 1210  GLUCAP 141* 162* 120* 134* 164* 102* 142*   Recent Labs  Lab 05/29/23 0543 05/30/23 0418 06/01/23 0155 06/01/23 1610 06/02/23 0022 06/03/23 0617 06/04/23 0404   GLUCOSE 136* 172* 147* 183* 151* 148* 125*    Leukocytosis and Fever in the setting of ? Acute Cholecystitis, Acute Cholecystitis ruled out and ? Chronic Cholecystitis; fever is resolved with WBC continues to still be elevated -Had a Temperature on 8/18 and was almost 103 Degrees Farenheit  -Unclear Etiology of leukocytosis but ? Reactive and fluctuating Recent Labs  Lab 05/27/23 1213 05/30/23 0418 06/01/23 0155 06/01/23 1610 06/02/23 0022 06/03/23 0617 06/04/23 0404  WBC 15.0* 14.5* 14.4* 12.0* 11.9* 11.5* 12.2*  -U/A Negative  -Blood Cx x2 done and showed NGTD at 5 Days  -RUQ U/S done and showed "Distended gallbladder with wall thickening. Shadowing stones and sludge. Please correlate with any clinical evidence of acute cholecystitis. If there is further concern of acute cholecystitis, HIDA scan may be useful for further delineation. No separate biliary ductal dilatation" -Obtained HIDA Scan and showed "No common duct or cystic duct obstruction. There is delayed visualization of the gallbladder however. Please correlate for etiology including chronic cholecystitis."  -Add IV Abx with Ceftriaxone and Metronidazole and will continue  -Repeat CXR in the AM -Continue to Monitor for S/Sx of Infection -General Surgery consulted for further evaluation and recommendations and are recommending following up on the HIDA scan if the HIDA did not show any evidence of acute cholecystitis we will plan to sign off but if the HIDA shows evidence of acute cholecystitis  there is no plan for surgical intervention with a laparoscopic cholecystectomy likely as early as Saturday or Sunday; Since HIDA did not show Acute Cholecystitis, the Surgery Team has signed off the case.   Normocytic Anemia -Hgb/Hct Trend: Recent Labs  Lab 05/27/23 1213 05/30/23 0418 06/01/23 0155 06/01/23 1610 06/02/23 0022 06/03/23 0617 06/04/23 0404  HGB 10.2* 9.1* 8.0* 8.2* 8.2* 8.6* 8.0*  HCT 31.3* 27.9* 24.3* 25.2* 25.2*  25.7* 24.4*  MCV 91.5 94.3 89.7 91.6 91.6 91.8 90.7  -Check Anemia Panel in the AM -Continue to Monitor for S/Sx of Bleeding; No overt bleeding noted -Repeat CBC in the AM    Hyperbilirubinemia  Abnormal LFTs and Elevated LFTs Recent Labs  Lab 05/23/23 0140 05/28/23 0450 05/30/23 0418 06/01/23 1610 06/02/23 0022 06/03/23 0617 06/04/23 0404  AST 44* 30 31 155* 130* 72* 38  ALT 22 24 22  112* 118* 104* 72*  BILITOT 0.7 0.7 1.3* 0.9 0.7 0.8 0.8  ALKPHOS 30* 34* 73 148* 137* 132* 120  -Will check RUQ U/S and it showed "Distended gallbladder with wall thickening. Shadowing stones and sludge. Please correlate with any clinical evidence of acute cholecystitis. If there is further concern of acute cholecystitis, HIDA scan may be useful for further delineation. No separate biliary ductal dilatation" -HIDA ordered and showed "No common duct or cystic duct obstruction. There is delayed visualization of the gallbladder however. Please correlate for etiology including chronic cholecystitis." -Acute Hepatitis Panel was negative -Continue to Monitor and Trend and repeat CMP in the AM as LFTs are now normalizing    Enlarged Thyroid -Incidental finding on CT. -Workup negative with Thyroid U/S and showed "Enlarged heterogeneous thyroid without discrete nodule." -Will need Outpatient follow-up with Endocrinology    Dehydration -Resolved  GERD/GI Prophylaxis -C/w Pantoprazole 40 mg po daily   Hypoalbuminemia -Patient's Albumin Trend: Recent Labs  Lab 05/23/23 0140 05/28/23 0450 05/30/23 0418 06/01/23 1610 06/02/23 0022 06/03/23 0617 06/04/23 0404  ALBUMIN 2.5* 2.7* 2.4* 2.1* 2.0* 2.1* 1.9*  -Continue to Monitor and Trend and repeat CMP in the AM  Obesity -Complicates overall prognosis and care -Estimated body mass index is 39.95 kg/m as calculated from the following:   Height as of this encounter: 5\' 10"  (1.778 m).   Weight as of this encounter: 126.3 kg.  -Weight Loss and Dietary  Counseling given

## 2023-05-21 NOTE — Progress Notes (Signed)
  Echocardiogram 2D Echocardiogram has been performed.  Delcie Roch 05/21/2023, 8:59 AM

## 2023-05-21 NOTE — Evaluation (Signed)
Occupational Therapy Evaluation Patient Details Name: Katherine Cook MRN: [id number] DOB: 02-13-1941 Today's Date: 05/21/2023   History of Present Illness Katherine Cook is a 82 y.o. female who presented on 05/20/23 after a fall at home, down 10-12 hours. Admitted for Rhabdomyolysis. PMHx:  insulin-dependent  DM type II, diabetic neuropathy, hyperlipidemia, essential hypertension and CKD stage 3A   Clinical Impression   Pihu was evaluated s/p the above admission list. She lives alone and is mod I at baseline, dtr assists with IADLs as needed and checks on pt daily. Upon evaluation the pt was limited by weakness, soreness, back pain and decreased activity tolerance. Overall she needed max A +2 for bed mobility and to stand within the stedy frame. Due to the deficits listed below the pt also needs total A for LB ADLs and min A for UB ADLs. Pt will benefit from continued acute OT services and skilled inpatient follow up therapy, <3 hours/day.        If plan is discharge home, recommend the following: A lot of help with walking and/or transfers;Two people to help with walking and/or transfers;A lot of help with bathing/dressing/bathroom;Two people to help with bathing/dressing/bathroom;Assistance with cooking/housework;Assistance with feeding;Help with stairs or ramp for entrance;Assist for transportation    Functional Status Assessment  Patient has had a recent decline in their functional status and demonstrates the ability to make significant improvements in function in a reasonable and predictable amount of time.  Equipment Recommendations  None recommended by OT (defer)       Precautions / Restrictions Precautions Precautions: Fall Restrictions Weight Bearing Restrictions: No      Mobility Bed Mobility Overal bed mobility: Needs Assistance Bed Mobility: Sit to Supine, Rolling, Sidelying to Sit Rolling: Max assist, +2 for physical assistance, +2 for safety/equipment Sidelying to sit: Max  assist, +2 for physical assistance, +2 for safety/equipment       General bed mobility comments: heavy use of rails, cues needed    Transfers Overall transfer level: Needs assistance Equipment used: Ambulation equipment used Transfers: Sit to/from Stand, Bed to chair/wheelchair/BSC Sit to Stand: Max assist, +2 physical assistance, +2 safety/equipment, From elevated surface           General transfer comment: Attempted STS at EOB with RW, pt unable to get fully upright. Max A +2 and stedy for transfer Transfer via Lift Equipment: Stedy    Balance Overall balance assessment: Needs assistance Sitting-balance support: Feet supported Sitting balance-Leahy Scale: Fair     Standing balance support: Bilateral upper extremity supported, During functional activity Standing balance-Leahy Scale: Poor                             ADL either performed or assessed with clinical judgement   ADL Overall ADL's : Needs assistance/impaired Eating/Feeding: Set up;Sitting   Grooming: Set up;Sitting   Upper Body Bathing: Set up;Sitting   Lower Body Bathing: +2 for physical assistance;Maximal assistance;+2 for safety/equipment   Upper Body Dressing : Minimal assistance;Sitting   Lower Body Dressing: Total assistance;Sit to/from stand   Toilet Transfer: Total assistance   Toileting- Clothing Manipulation and Hygiene: Total assistance       Functional mobility during ADLs: Maximal assistance;+2 for physical assistance;+2 for safety/equipment General ADL Comments: limited by weakness, pain, poor ROM     Vision Baseline Vision/History: 0 No visual deficits Vision Assessment?: No apparent visual deficits     Perception Perception: Not tested  Praxis Praxis: Not tested       Pertinent Vitals/Pain Pain Assessment Pain Assessment: Faces Faces Pain Scale: Hurts little more Pain Location: back Pain Descriptors / Indicators: Discomfort, Grimacing, Guarding Pain  Intervention(s): Limited activity within patient's tolerance, Monitored during session     Extremity/Trunk Assessment Upper Extremity Assessment Upper Extremity Assessment: Generalized weakness   Lower Extremity Assessment Lower Extremity Assessment: Defer to PT evaluation   Cervical / Trunk Assessment Cervical / Trunk Assessment: Kyphotic;Other exceptions Cervical / Trunk Exceptions: increased body habitus   Communication Communication Communication: Difficulty communicating thoughts/reduced clarity of speech (Mild)   Cognition Arousal: Alert Behavior During Therapy: Flat affect Overall Cognitive Status: No family/caregiver present to determine baseline cognitive functioning                 General Comments: A&Ox4, follows all simple commands with increased time. Slowed processing with some difficulty getting words/sentences out     General Comments  Unable to get accurate orthostatic pressures, BP soft at the start and end of the session     Home Living Family/patient expects to be discharged to:: Private residence Living Arrangements: Alone Available Help at Discharge: Available PRN/intermittently Type of Home: House Home Access: Ramped entrance     Home Layout: One level     Bathroom Shower/Tub: Chief Strategy Officer: Standard     Home Equipment: Rollator (4 wheels);Tub bench;Grab bars - tub/shower          Prior Functioning/Environment Prior Level of Function : Needs assist             Mobility Comments: Could ambulate in home with rollator ADLs Comments: Daughter assist with IADLs; pt performs ADLs. Pt drives and manages her medication        OT Problem List: Decreased strength;Decreased range of motion;Decreased activity tolerance;Impaired balance (sitting and/or standing);Decreased cognition;Decreased safety awareness;Decreased knowledge of use of DME or AE;Decreased knowledge of precautions;Pain      OT  Treatment/Interventions: Self-care/ADL training;Therapeutic exercise;DME and/or AE instruction;Therapeutic activities;Patient/family education;Balance training    OT Goals(Current goals can be found in the care plan section) Acute Rehab OT Goals Patient Stated Goal: to get better OT Goal Formulation: With patient Time For Goal Achievement: 06/04/23 Potential to Achieve Goals: Good ADL Goals Pt Will Perform Grooming: standing;with min assist Pt Will Perform Lower Body Dressing: with mod assist;sit to/from stand Pt Will Transfer to Toilet: with mod assist;stand pivot transfer;bedside commode Additional ADL Goal #1: Pt will complete bed mobility with min A as a precursor to ADLs  OT Frequency: Min 1X/week    Co-evaluation PT/OT/SLP Co-Evaluation/Treatment: Yes Reason for Co-Treatment: Complexity of the patient's impairments (multi-system involvement);To address functional/ADL transfers;For patient/therapist safety PT goals addressed during session: Mobility/safety with mobility;Balance;Proper use of DME OT goals addressed during session: ADL's and self-care      AM-PAC OT "6 Clicks" Daily Activity     Outcome Measure Help from another person eating meals?: A Little Help from another person taking care of personal grooming?: A Little Help from another person toileting, which includes using toliet, bedpan, or urinal?: Total Help from another person bathing (including washing, rinsing, drying)?: A Lot Help from another person to put on and taking off regular upper body clothing?: A Little Help from another person to put on and taking off regular lower body clothing?: Total 6 Click Score: 13   End of Session Equipment Utilized During Treatment: Gait belt Nurse Communication: Mobility status  Activity Tolerance: Patient tolerated treatment well Patient left:  in chair;with call bell/phone within reach;with chair alarm set  OT Visit Diagnosis: Unsteadiness on feet (R26.81);Other  abnormalities of gait and mobility (R26.89);Muscle weakness (generalized) (M62.81);History of falling (Z91.81)                Time: 5009-3818 OT Time Calculation (min): 39 min Charges:  OT General Charges $OT Visit: 1 Visit OT Evaluation $OT Eval Moderate Complexity: 1 Mod OT Treatments $Self Care/Home Management : 8-22 mins  Derenda Mis, OTR/L Acute Rehabilitation Services Office [phone] Secure Chat Communication Preferred   Donia Pounds 05/21/2023, 1:14 PM

## 2023-05-21 NOTE — Consult Note (Addendum)
NEUROLOGY CONSULTATION  Reason for Consult: Left leg weakness  CC: Fall secondary to LLE weakness  HPI: Katherine Cook is a 82 y.o. female with a past medical history of  insulin-dependent  DM type II, diabetic neuropathy, hyperlipidemia, essential hypertension and CKD stage 3A presented to emergency department after a fall at home.  She reports she got out of bed and her left leg gave out. She denies tripping over her cane or walker. She has not had any other falls recently. She typically ambulates with a cane or a walker. She denies back pain or neck pain. She is adamant that her weakness was present before her fall, with initial onset in March followed by gradual progression since then. She has been worked up for this with scans back in May revealing degenerative lumbar findings that she was told were not amenable to surgery given her age, but which could be treated with PT and injections.   Her symptoms first began with sensory changes to her left foot back in November after an immobilizer boot was placed for management of a traumatic left foot fracture, but progressed to involve the entire leg in a circumferential distribution accompanied by diffuse and gradually worsening LLE weakness after the boot was removed back in March. She has experienced some improvement in her LLE weakness with the injections and PT. Sensory numbness is described as the foot and leg "falling asleep" with associated discomfort that impedes her walking. The discomfort is worst to her left foot and is exacerbated by placing her weight on it when attempting to ambulate. Her LLE weakness is described by patient and family as being due to a combination of muscle weakness and pain. She does have a history of degenerative joint disease, confirmed by imaging. She has had prior bilateral knee surgeries. She is also obese with evidence for diffuse deconditioning.    ROS: Pertinent items noted in HPI and remainder of comprehensive ROS  otherwise negative.   Past Medical History:  Past Medical History:  Diagnosis Date   Diabetes mellitus without complication (HCC)     Family History History reviewed. No pertinent family history.  Allergies:  No Known Allergies  Social History:   reports that she has never smoked. She has never used smokeless tobacco. She reports that she does not drink alcohol and does not use drugs.    Medications Medications Prior to Admission  Medication Sig Dispense Refill   pregabalin (LYRICA) 100 MG capsule Take 100 mg by mouth 2 (two) times daily.     amLODipine (NORVASC) 10 MG tablet Take 10 mg by mouth daily.     celecoxib (CELEBREX) 200 MG capsule Take 200 mg by mouth daily.     donepezil (ARICEPT) 10 MG tablet Take 10 mg by mouth daily.     furosemide (LASIX) 40 MG tablet Take 40 mg by mouth daily.     gabapentin (NEURONTIN) 100 MG capsule Take 100 mg by mouth 2 (two) times daily.     LEVEMIR FLEXPEN 100 UNIT/ML FlexPen Inject into the skin.     losartan (COZAAR) 50 MG tablet Take 25 mg by mouth daily.     MOUNJARO 10 MG/0.5ML Pen Inject into the skin.     omeprazole (PRILOSEC) 20 MG capsule Take 20 mg by mouth daily.     rosuvastatin (CRESTOR) 40 MG tablet Take 40 mg by mouth daily.     SUMAtriptan (IMITREX) 100 MG tablet Take 1 tablet (100 mg total) by mouth every 6 (six) hours  as needed for migraine. May repeat in 2 hours if headache persists or recurs. 8 tablet 0    EXAMINATION  Current vital signs:    05/21/2023    4:04 PM 05/21/2023    7:44 AM 05/21/2023    4:16 AM  Vitals with BMI  Weight   269 lbs 6 oz  BMI   38.66  Systolic 134 113 161  Diastolic 68 52 49  Pulse 71 70 73    Examination:  GENERAL: Awake, alert in NAD HEENT: - Normocephalic and atraumatic, dry mm, no lymphadenopathy, no Thyromegally LUNGS - Regular, unlabored respirations CV - S1S2 RRR, equal pulses bilaterally. ABDOMEN - Soft, nontender, nondistended with normoactive BS Ext: warm, well  perfused, intact peripheral pulses, no pedal edema Integumentary:  Skin intact on clothed exam  NEURO:  Mental Status: AA&Ox3  Language: speech is clear.  Intact naming, repetition, fluency, and comprehension. Cranial Nerves:  II: PERRL. Visual fields full to confrontation.  III, IV, VI: EOM intact. Eyelids elevate symmetrically. Blinks to threat.  V: Sensation intact V1-3 symmetrically  WRU:EAVW nasolabial fold flattening  VIII: hearing intact to voice IX, X: Palate elevates symmetrically. Phonation is normal.  UJ:[id number] shrug 5/5 and symmetrical  XII: tongue is midline without fasciculations. Motor:  RUE:  grip   4+/5    biceps  4+/5    triceps 4+/5      LUE: grip   4+/5    biceps  4+/5    triceps 4+/5      RLE: thigh  4+/5    knee  5 /5     plantar flexion  5 /5     dorsiflexion  5 /5        LLE: thigh  3 /5    knee flexion and extension 4-/5    plantar flexion   4- /5     dorsiflexion   4-/5. Slight drift Tone is normal and bulk is not able to be accurately determined due to prominent adipose tissue DTRs: 2+ in the upper extremities  1+patellars bilaterally - bilateral knee replacements Sensation: RLE: Normal LLE: Diminished sensation on the left leg in a circumferential distribution to the hip, as well as decreased lower left abdomen sensation to approximately the T10 level Sensation symmetric and intact in the arms Coordination: FTN intact bilaterally, no ataxia in BLE., no abnormal movements  Gait- Deferred   LABS  I have reviewed labs in epic and the results pertinent to this consultation are:   No results found for: "[facility]" Lab Results  Component Value Date   ALT 23 05/21/2023   AST 71 (H) 05/21/2023   ALKPHOS 30 (L) 05/21/2023   BILITOT 1.2 05/21/2023   Lab Results  Component Value Date   HGBA1C 6.4 (H) 05/21/2023   Lab Results  Component Value Date   WBC 10.1 05/21/2023   HGB 9.2 (L) 05/21/2023   HCT 27.9 (L) 05/21/2023   MCV 89.4 05/21/2023   PLT  158 05/21/2023   No results found for: "VITAMINB12" No results found for: "FOLATE" Lab Results  Component Value Date   NA 143 05/21/2023   K 4.6 05/21/2023   CL 107 05/21/2023   CO2 25 05/21/2023    DIAGNOSTIC IMAGING/PROCEDURES  I have reviewed the images obtained:, as below    MRI brain 1. No acute intracranial pathology.  2. 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   Assessment:  82 y.o. female with a past medical history of  insulin-dependent  DM type II, diabetic neuropathy, hyperlipidemia, essential hypertension and CKD stage 3A who presented to the emergency department after a fall at home.  She reports she got out of bed and her left leg gave out. She endorses diminished sensation to light touch on the left leg as well. Her symptoms began with sensory changes to her left foot back in November after a boot was placed for management of a traumatic left foot fracture, but progressed to involve the entire leg in a circumferential distribution accompanied by diffuse and gradually worsening LLE weakness.  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 - Prior MRI L-spine (5/31):  Diffuse lumbar spine spondylosis included severe spinal stenosis and moderate bilateral foraminal stenosis at L3-L4, severe left foraminal stenosis and severe spinal stenosis at L4-L5. The worst findings were at T11-12 where severe spinal stenosis as well as probable severe bilateral neural foraminal stenosis were present. Images personally reviewed.  - Prior MRI of hips (5/6) revealed mild degenerative changes of the left hip as well as left gluteus minimus tendinosis with associated muscular T2 hyperintensity (similar  findings were also present on the right).   - Exam today reveals weakness of all muscle groups of the LLE from the foot dorsi/plantar flexors up to the hip, including weak hip flexion and internal/external rotation. Sensation is impaired to FT with dull sensation circumferentially extending from the sole of the left foot to the hip and left lower abdomen to about the T10 level. There is equivocal mild muscle wasting of the LLE proximally and distally, but this is difficult to discern due to prominent adipose tissue. Patellar reflex is preserved and symmetric with the right patellar. Achilles reflexes unelicitable. Toes are downgoing. Upper extremities with diffuse 4+/5 weakness, likely due to deconditioning. Sensation intact in arms and face. 2+ reflexes in bilateral upper extremities.  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  - Labs: CK 2412 >> 2208. May be secondary to the fall. Would continue to track.  - DDx:  - Given her history of symptoms progressing since Nov of 2023, this is most likely a gradually progressive process. Symptoms started after placement of a boot for a left foot fracture, but continued to worsen following removal of the boot in March, supporting a progressively worsening polyradiculopathy.  - However, question if there is also a component of chronic deconditioning as her symptoms began when she was subject to decreased mobility back in November secondary to placement of a boot to her left foot and lower leg to manage a fractured left foot.  - Exam findings do not strongly localize to the spinal cord, but chronic spinal cord compression more proximally to the imaged lumbar levels could possibly contribute.  - Also a differential diagnostic consideration would be diabetic amyotrophy affecting the left quadriceps, which could help explain her symptoms of left knee "locking up" as well as buckling  secondary to weakness, as described by the patient.    Recommendations: - MRI C Spine and T-Spine - PT for gait assessment as well as therapy - Further recommendations after the above MRI scans have been completed - Repeat CK levels QAM (ordered)   --  Patient seen and examined by NP/APP and MD.  Elmer Picker, DNP, FNP-BC [facility] Pager: [phone]   I have seen and examined the patient. I have formulated the assessment and recommendations. 82 year old  female with a past medical history of  insulin-dependent  DM type II, diabetic neuropathy, hyperlipidemia, essential hypertension and CKD stage 3A who presented to the emergency department after a fall at home.  She reports she got out of bed and her left leg gave out. She endorses diminished sensation to light touch on the left leg as well. Exam today reveals weakness of all muscle groups of the LLE from the foot dorsi/plantar flexors up to the hip, including weak hip flexion and internal/external rotation. Sensation is impaired to FT with dull sensation circumferentially extending from the sole of the left foot to the hip and left lower abdomen to about the T10 level. There is equivocal mild muscle wasting of the LLE proximally and distally, but this is difficult to discern due to prominent adipose tissue. Patellar reflex is preserved and symmetric with the right patellar. Achilles reflexes unelicitable. Toes are downgoing. Upper extremities with diffuse 4+/5 weakness, likely due to deconditioning. Sensation intact in arms and face. 2+ reflexes in bilateral upper extremities. DDx and recommendations for further imaging as above.  Electronically signed: Dr. Caryl Pina

## 2023-05-21 NOTE — ED Notes (Signed)
[facility] called for transport. 

## 2023-05-21 NOTE — H&P (Signed)
History and Physical    Katherine Cook YNW:[id number] DOB: [birth year] DOA: 05/20/2023  PCP: Earley Brooke., MD   Patient coming from: Home   Chief Complaint:  Chief Complaint  Patient presents with   Fall    HPI:  Katherine Cook is a 82 y.o. female with medical history significant of insulin-dependent  DM type II, diabetic neuropathy, hyperlipidemia, essential hypertension and CKD stage 3A presented to emergency department after an mechanical fall at home.  Patient reported that she was trying to get out of the bed and stepping forward when she lost her balance.  After falling she developed weakness and unable to get her by herself.  Patient wires medical alert necklace and try to activate.  She was unable to contact additional help and low on the floor until 7 PM 05/20/2023.  She also developed some numbness on the left-sided leg due to lying on that most of the day which has been resolved.  Patient was on the floor almost for 12 hours.  On EMS arrival patient found alert oriented x 4 and GCS 15.  Patient found to have a small laceration on the right elbow with bruising.  Also complaining about right-sided hip pain down the left leg.  During my evaluation patient reported that she was trying to get out of the bed to get her bedside commode.  Lost her balance as her one of the wheel of her walker does not work and due to loss of balance she fell on the ground.  She remained on the ground almost 10 to 12 hours.  Finally later in the evening her daughter discovered her on the ground and called EMS.  Patient denies any hitting of her head, loss of consciousness, previous fall, chest pain, palpitation, shortness of breath, seizure, tremor, abdominal pain and diarrhea.   ED Course:  At ED initial presentation patient found to have slight elevated temperature 100.8 F.  Blood pressure within normal range 125/59, heart rate 84 and respiratory rate 20.  O2 sat 91% room air.  CMP unremarkable sodium  142, potassium 4.8, chloride 106, bicarb 25, blood glucose 210, BUN 34, creatinine 1.9 (baseline creatinine 1.4-1.5), slightly elevated AST 71, normal ALT, normal ALP, elevated bilirubin 1.4 and anion gap 11.  CBC showed elevated WBC count 12, low H&H 10.2 and 30, and platelet count 170.  Elevated CK 2414.  COVID-negative.  Normal lipase.  High sensitive troponin 31>30.  EKG shows sinus rhythm heart rate 80.  Borderline prolonged PR interval.  No ST and T wave abnormality.  Chest x-ray no acute active disease process.  Aortic atherosclerosis  X-ray pelvis negative for acute traumatic injury.  CT head no acute intracranial abnormality.  No displaced facial fracture.  No displaced or traumatic cervical spine.  Multilevel severe osseous neural foraminal stenosis more severe at C3-C4 level.  Enlarged heterogenous thyroid gland.  Recommended thyroid ultrasound.  CT maxillofacial no acute intracranial abnormality and facial fracture.  CT chest abdomen pelvis showed questionable acute displaced right femoral neck fracture recommended follow-up with three-view x-ray hip.  No acute intrathoracic, intra-abdominal and intrapelvic traumatic injury.  Cholelithiasis with evidence of acute cholecystitis.  Acute arthrosclerosis.  Following x-ray of the right-sided hip right-sided hip ruled out any acute fracture or dislocation.  Right proximal femur is intact.  In the ED patient has been resuscitated with 500 mL of NS bolus and started on LR 100 cc/h.  Patient has been transferred to [facility] for admission for further  workup of mechanical fall and rhabdomyolysis.   Review of Systems:  Review of Systems  Constitutional:  Negative for chills, fever, malaise/fatigue and weight loss.  HENT:  Negative for hearing loss and tinnitus.   Eyes:  Negative for blurred vision and double vision.  Respiratory:  Negative for cough, sputum production and shortness of breath.   Cardiovascular:  Negative  for chest pain, palpitations and leg swelling.  Gastrointestinal:  Negative for abdominal pain, diarrhea, heartburn, nausea and vomiting.  Genitourinary:  Negative for dysuria, frequency and urgency.  Musculoskeletal:  Positive for falls. Negative for back pain, joint pain, myalgias and neck pain.  Neurological:  Negative for dizziness, tremors, sensory change, speech change, focal weakness, seizures, loss of consciousness, weakness and headaches.  Endo/Heme/Allergies:  Negative for polydipsia.  Psychiatric/Behavioral:  The patient is not nervous/anxious.     Past Medical History:  Diagnosis Date   Diabetes mellitus without complication (HCC)     Past Surgical History:  Procedure Laterality Date   ABDOMINAL HYSTERECTOMY     BACK SURGERY     NECK SURGERY     REPLACEMENT TOTAL KNEE BILATERAL       reports that she has never smoked. She has never used smokeless tobacco. She reports that she does not drink alcohol and does not use drugs.  No Known Allergies  History reviewed. No pertinent family history.  Prior to Admission medications   Medication Sig Start Date End Date Taking? Authorizing Provider  SUMAtriptan (IMITREX) 100 MG tablet Take 1 tablet (100 mg total) by mouth every 6 (six) hours as needed for migraine. May repeat in 2 hours if headache persists or recurs. 07/02/17   Charlynne Pander, MD     Physical Exam: Vitals:   05/21/23 0130 05/21/23 0200 05/21/23 0300 05/21/23 0416  BP: (!) 123/50 118/74 (!) 112/52 (!) 109/49  Pulse: 78 86 69 73  Resp: (!) 23 (!) 24 13 18   Temp:    98.5 F (36.9 C)  TempSrc:    Oral  SpO2: 90% 93% 93% 95%  Weight:    122.2 kg  Height:        Physical Exam Constitutional:      General: She is not in acute distress.    Appearance: She is not ill-appearing.  HENT:     Head: Normocephalic and atraumatic.     Comments: Right-sided upper eyelid small and bruise    Nose: Nose normal.     Mouth/Throat:     Mouth: Mucous membranes are  moist.  Eyes:     Pupils: Pupils are equal, round, and reactive to light.  Cardiovascular:     Rate and Rhythm: Normal rate and regular rhythm.     Pulses: Normal pulses.     Heart sounds: Normal heart sounds.  Pulmonary:     Effort: Pulmonary effort is normal.     Breath sounds: Normal breath sounds.  Abdominal:     General: Bowel sounds are normal. There is no distension.     Tenderness: There is no abdominal tenderness.  Musculoskeletal:     Cervical back: Neck supple.     Right lower leg: No edema.     Left lower leg: No edema.  Skin:    General: Skin is warm.     Capillary Refill: Capillary refill takes less than 2 seconds.  Neurological:     Mental Status: She is alert and oriented to person, place, and time.     Cranial Nerves: No cranial nerve  deficit.     Motor: No weakness.  Psychiatric:        Mood and Affect: Mood normal.        Thought Content: Thought content normal.        Judgment: Judgment normal.      Labs on Admission: I have personally reviewed following labs and imaging studies  CBC: Recent Labs  Lab 05/20/23 2137  WBC 12.0*  NEUTROABS 9.5*  HGB 10.2*  HCT 30.0*  MCV 89.6  PLT 170   Basic Metabolic Panel: Recent Labs  Lab 05/20/23 2137  NA 142  K 4.8  CL 106  CO2 25  GLUCOSE 210*  BUN 34*  CREATININE 1.98*  CALCIUM 9.2   GFR: Estimated Creatinine Clearance: 31.7 mL/min (A) (by C-G formula based on SCr of 1.98 mg/dL (H)). Liver Function Tests: Recent Labs  Lab 05/20/23 2137  AST 71*  ALT 23  ALKPHOS 34*  BILITOT 1.4*  PROT 6.7  ALBUMIN 3.4*   Recent Labs  Lab 05/20/23 2137  LIPASE 27   No results for input(s): "AMMONIA" in the last 168 hours. Coagulation Profile: No results for input(s): "INR", "PROTIME" in the last 168 hours. Cardiac Enzymes: Recent Labs  Lab 05/20/23 2137 05/20/23 2340  CKTOTAL 2,412*  --   TROPONINIHS 31* 30*   BNP (last 3 results) No results for input(s): "BNP" in the last 8760  hours. HbA1C: No results for input(s): "HGBA1C" in the last 72 hours. CBG: No results for input(s): "GLUCAP" in the last 168 hours. Lipid Profile: No results for input(s): "CHOL", "HDL", "LDLCALC", "TRIG", "CHOLHDL", "LDLDIRECT" in the last 72 hours. Thyroid Function Tests: No results for input(s): "TSH", "T4TOTAL", "FREET4", "T3FREE", "THYROIDAB" in the last 72 hours. Anemia Panel: No results for input(s): "VITAMINB12", "FOLATE", "FERRITIN", "TIBC", "IRON", "RETICCTPCT" in the last 72 hours. Urine analysis:    Component Value Date/Time   COLORURINE YELLOW 07/05/2019 1530   APPEARANCEUR CLEAR 07/05/2019 1530   LABSPEC 1.020 07/05/2019 1530   PHURINE 5.5 07/05/2019 1530   GLUCOSEU >=500 (A) 07/05/2019 1530   HGBUR NEGATIVE 07/05/2019 1530   BILIRUBINUR NEGATIVE 07/05/2019 1530   KETONESUR NEGATIVE 07/05/2019 1530   PROTEINUR 100 (A) 07/05/2019 1530   NITRITE NEGATIVE 07/05/2019 1530   LEUKOCYTESUR NEGATIVE 07/05/2019 1530    Radiological Exams on Admission: I have personally reviewed images DG Hip Unilat W or Wo Pelvis 2-3 Views Right  Result Date: 05/21/2023 CLINICAL DATA:  Fall, possible hip fracture on CT EXAM: DG HIP (WITH OR WITHOUT PELVIS) 2-3V RIGHT COMPARISON:  CT abdomen/pelvis dated 05/20/2023 FINDINGS: No fracture or dislocation is seen. Specifically, the right proximal femur is intact. Bilateral joint spaces are preserved. Visualized bony pelvis is intact. Mild degenerative changes of the lower lumbar spine. IMPRESSION: No fracture or dislocation is seen. Specifically, the right proximal femur is intact. Electronically Signed   By: Charline Bills M.D.   On: 05/21/2023 01:37   DG Pelvis Portable  Result Date: 05/20/2023 CLINICAL DATA:  fall EXAM: PORTABLE PELVIS 1-2 VIEWS COMPARISON:  None Available. FINDINGS: Limited evaluation due to overlapping osseous structures and overlying soft tissues. There is no evidence of pelvic fracture or diastasis. No hip dislocation  bilaterally. Grossly unremarkable proximal femurs with no definite acute displaced fracture. Degenerative changes of bilateral sacroiliac joints. No pelvic bone lesions are seen. IMPRESSION: Negative for definite acute traumatic injury. Limited evaluation due to overlapping osseous structures and overlying soft tissues. Electronically Signed   By: Tish Frederickson M.D.   On:  05/20/2023 23:32   DG Chest Portable 1 View  Result Date: 05/20/2023 CLINICAL DATA:  fall EXAM: PORTABLE CHEST 1 VIEW COMPARISON:  Chest x-ray 07/05/2019 FINDINGS: The heart and mediastinal contours are within normal limits. Aortic calcification. No focal consolidation. No pulmonary edema. No pleural effusion. No pneumothorax. No acute osseous abnormality. IMPRESSION: 1. No active disease. 2.  Aortic Atherosclerosis (ICD10-I70.0). Electronically Signed   By: Tish Frederickson M.D.   On: 05/20/2023 23:30   CT CHEST ABDOMEN PELVIS WO CONTRAST  Result Date: 05/20/2023 CLINICAL DATA:  Polytrauma, blunt EXAM: CT CHEST, ABDOMEN AND PELVIS WITHOUT CONTRAST TECHNIQUE: Multidetector CT imaging of the chest, abdomen and pelvis was performed following the standard protocol without IV contrast. RADIATION DOSE REDUCTION: This exam was performed according to the departmental dose-optimization program which includes automated exposure control, adjustment of the mA and/or kV according to patient size and/or use of iterative reconstruction technique. COMPARISON:  MRI right hip 02/14/2023, x-ray pelvis 05/20/2023 FINDINGS: CHEST: Cardiovascular: The thoracic aorta is normal in caliber. The heart is normal in size. No significant pericardial effusion. Mitral annular calcification. Left anterior descending coronary calcification. Mild atherosclerotic plaque of the aorta. Lungs/Pleura: No focal consolidation. No pulmonary nodule. No pulmonary mass. No pulmonary contusion or laceration. No pneumatocele formation. No pleural effusion. No pneumothorax. No  hemothorax. Mediastinum/Nodes: No pneumomediastinum. The central airways are patent. The esophagus is unremarkable. Tiny hiatal hernia. Enlarged and heterogeneous thyroid gland-follow-up with ultrasound. Limited evaluation for hilar lymphadenopathy on this noncontrast study. No mediastinal or axillary lymphadenopathy. Musculoskeletal/Chest wall No chest wall mass. No acute rib or sternal fracture. No spinal fracture. Multilevel bulky osteophyte formation. ABDOMEN / PELVIS: Hepatobiliary: Not enlarged. No focal lesion. Peripherally calcified gallstone within the gallbladder lumen. Gallstone measures up to 3 cm. No biliary ductal dilatation. Pancreas: Normal pancreatic contour. No main pancreatic duct dilatation. Spleen: Not enlarged. No focal lesion. Adrenals/Urinary Tract: No nodularity bilaterally. No hydroureteronephrosis. No nephroureterolithiasis. No contour deforming renal mass. The urinary bladder is unremarkable. Stomach/Bowel: No small or large bowel wall thickening or dilatation. The appendix is unremarkable. Vasculature/Lymphatic: Mild to moderate atherosclerotic plaque. No abdominal aorta or iliac aneurysm. No abdominal, pelvic, inguinal lymphadenopathy. Reproductive: Status post hysterectomy. Bilateral adnexa are unremarkable. Other: No simple free fluid ascites. No pneumoperitoneum. No mesenteric hematoma identified. No organized fluid collection. Musculoskeletal: No significant soft tissue hematoma. Question acute nondisplaced right femoral neck fracture (2:116). No spinal fracture. Multilevel moderate severe degenerative changes of the spine multilevel intervertebral disc space vacuum phenomenon and mild retrolisthesis of L5 on S1. Ports and Devices: None. IMPRESSION: 1. Question acute nondisplaced right femoral neck fracture. Recommend dedicated three view right hip radiograph for further evaluation. 2. No acute intrathoracic, intra-abdominal, intrapelvic traumatic injury with limited evaluation on  this noncontrast study. 3. No acute fracture or traumatic malalignment of the thoracic or lumbar spine. 4. Other imaging findings of potential clinical significance: Tiny hiatal hernia. Cholelithiasis with no CT evidence of acute cholecystitis. Aortic Atherosclerosis (ICD10-I70.0) including mitral annular and left anterior descending coronary calcification. Electronically Signed   By: Tish Frederickson M.D.   On: 05/20/2023 23:29   CT Head Wo Contrast  Result Date: 05/20/2023 CLINICAL DATA:  Head trauma, minor (Age >= 65y); Neck trauma (Age >= 65y); Facial trauma, blunt EXAM: CT HEAD WITHOUT CONTRAST CT MAXILLOFACIAL WITHOUT CONTRAST CT CERVICAL SPINE WITHOUT CONTRAST TECHNIQUE: Multidetector CT imaging of the head, cervical spine, and maxillofacial structures were performed using the standard protocol without intravenous contrast. Multiplanar CT image reconstructions of the cervical spine  and maxillofacial structures were also generated. RADIATION DOSE REDUCTION: This exam was performed according to the departmental dose-optimization program which includes automated exposure control, adjustment of the mA and/or kV according to patient size and/or use of iterative reconstruction technique. COMPARISON:  CT head 07/02/2017 FINDINGS: CT HEAD FINDINGS Brain: Trace patchy and confluent areas of decreased attenuation are noted throughout the deep and periventricular white matter of the cerebral hemispheres bilaterally, compatible with chronic microvascular ischemic disease. No evidence of large-territorial acute infarction. No parenchymal hemorrhage. No mass lesion. No extra-axial collection. No mass effect or midline shift. No hydrocephalus. Basilar cisterns are patent. Vascular: No hyperdense vessel. Skull: No acute fracture or focal lesion. Other: None. CT MAXILLOFACIAL FINDINGS Osseous: No fracture or mandibular dislocation. No destructive process. Sinuses/Orbits: Paranasal sinuses and mastoid air cells are clear.  Bilateral lens replacement. Otherwise the orbits are unremarkable. Soft tissues: Negative. CT CERVICAL SPINE FINDINGS Alignment: Normal. Skull base and vertebrae: Multilevel severe degenerative changes of the spine with associated multilevel severe osseous neural foraminal stenosis. No severe osseous central canal stenosis. At least moderate osseous central canal stenosis at the C3-C4 level due to bulky posterior disc osteophyte complex formation and posterior longitudinal ligament calcification. No acute fracture. No aggressive appearing focal osseous lesion or focal pathologic process. Soft tissues and spinal canal: No prevertebral fluid or swelling. No visible canal hematoma. Upper chest: Unremarkable. Other: Enlarged heterogeneous thyroid gland. IMPRESSION: 1. No acute intracranial abnormality. 2.  No acute displaced facial fracture. 3. No acute displaced fracture or traumatic listhesis of the cervical spine. 4. Multilevel severe osseous neural foraminal stenosis. 5. At least moderate osseous central canal stenosis at the C3-C4 level. 6. Enlarged heterogeneous thyroid gland. Recommend thyroid ultrasound (ref: J Am Coll Radiol. 2015 Feb;12(2): 143-50). Electronically Signed   By: Tish Frederickson M.D.   On: 05/20/2023 23:16   CT Cervical Spine Wo Contrast  Result Date: 05/20/2023 CLINICAL DATA:  Head trauma, minor (Age >= 65y); Neck trauma (Age >= 65y); Facial trauma, blunt EXAM: CT HEAD WITHOUT CONTRAST CT MAXILLOFACIAL WITHOUT CONTRAST CT CERVICAL SPINE WITHOUT CONTRAST TECHNIQUE: Multidetector CT imaging of the head, cervical spine, and maxillofacial structures were performed using the standard protocol without intravenous contrast. Multiplanar CT image reconstructions of the cervical spine and maxillofacial structures were also generated. RADIATION DOSE REDUCTION: This exam was performed according to the departmental dose-optimization program which includes automated exposure control, adjustment of the mA  and/or kV according to patient size and/or use of iterative reconstruction technique. COMPARISON:  CT head 07/02/2017 FINDINGS: CT HEAD FINDINGS Brain: Trace patchy and confluent areas of decreased attenuation are noted throughout the deep and periventricular white matter of the cerebral hemispheres bilaterally, compatible with chronic microvascular ischemic disease. No evidence of large-territorial acute infarction. No parenchymal hemorrhage. No mass lesion. No extra-axial collection. No mass effect or midline shift. No hydrocephalus. Basilar cisterns are patent. Vascular: No hyperdense vessel. Skull: No acute fracture or focal lesion. Other: None. CT MAXILLOFACIAL FINDINGS Osseous: No fracture or mandibular dislocation. No destructive process. Sinuses/Orbits: Paranasal sinuses and mastoid air cells are clear. Bilateral lens replacement. Otherwise the orbits are unremarkable. Soft tissues: Negative. CT CERVICAL SPINE FINDINGS Alignment: Normal. Skull base and vertebrae: Multilevel severe degenerative changes of the spine with associated multilevel severe osseous neural foraminal stenosis. No severe osseous central canal stenosis. At least moderate osseous central canal stenosis at the C3-C4 level due to bulky posterior disc osteophyte complex formation and posterior longitudinal ligament calcification. No acute fracture. No aggressive appearing focal osseous  lesion or focal pathologic process. Soft tissues and spinal canal: No prevertebral fluid or swelling. No visible canal hematoma. Upper chest: Unremarkable. Other: Enlarged heterogeneous thyroid gland. IMPRESSION: 1. No acute intracranial abnormality. 2.  No acute displaced facial fracture. 3. No acute displaced fracture or traumatic listhesis of the cervical spine. 4. Multilevel severe osseous neural foraminal stenosis. 5. At least moderate osseous central canal stenosis at the C3-C4 level. 6. Enlarged heterogeneous thyroid gland. Recommend thyroid ultrasound  (ref: J Am Coll Radiol. 2015 Feb;12(2): 143-50). Electronically Signed   By: Tish Frederickson M.D.   On: 05/20/2023 23:16   CT Maxillofacial Wo Contrast  Result Date: 05/20/2023 CLINICAL DATA:  Head trauma, minor (Age >= 65y); Neck trauma (Age >= 65y); Facial trauma, blunt EXAM: CT HEAD WITHOUT CONTRAST CT MAXILLOFACIAL WITHOUT CONTRAST CT CERVICAL SPINE WITHOUT CONTRAST TECHNIQUE: Multidetector CT imaging of the head, cervical spine, and maxillofacial structures were performed using the standard protocol without intravenous contrast. Multiplanar CT image reconstructions of the cervical spine and maxillofacial structures were also generated. RADIATION DOSE REDUCTION: This exam was performed according to the departmental dose-optimization program which includes automated exposure control, adjustment of the mA and/or kV according to patient size and/or use of iterative reconstruction technique. COMPARISON:  CT head 07/02/2017 FINDINGS: CT HEAD FINDINGS Brain: Trace patchy and confluent areas of decreased attenuation are noted throughout the deep and periventricular white matter of the cerebral hemispheres bilaterally, compatible with chronic microvascular ischemic disease. No evidence of large-territorial acute infarction. No parenchymal hemorrhage. No mass lesion. No extra-axial collection. No mass effect or midline shift. No hydrocephalus. Basilar cisterns are patent. Vascular: No hyperdense vessel. Skull: No acute fracture or focal lesion. Other: None. CT MAXILLOFACIAL FINDINGS Osseous: No fracture or mandibular dislocation. No destructive process. Sinuses/Orbits: Paranasal sinuses and mastoid air cells are clear. Bilateral lens replacement. Otherwise the orbits are unremarkable. Soft tissues: Negative. CT CERVICAL SPINE FINDINGS Alignment: Normal. Skull base and vertebrae: Multilevel severe degenerative changes of the spine with associated multilevel severe osseous neural foraminal stenosis. No severe osseous  central canal stenosis. At least moderate osseous central canal stenosis at the C3-C4 level due to bulky posterior disc osteophyte complex formation and posterior longitudinal ligament calcification. No acute fracture. No aggressive appearing focal osseous lesion or focal pathologic process. Soft tissues and spinal canal: No prevertebral fluid or swelling. No visible canal hematoma. Upper chest: Unremarkable. Other: Enlarged heterogeneous thyroid gland. IMPRESSION: 1. No acute intracranial abnormality. 2.  No acute displaced facial fracture. 3. No acute displaced fracture or traumatic listhesis of the cervical spine. 4. Multilevel severe osseous neural foraminal stenosis. 5. At least moderate osseous central canal stenosis at the C3-C4 level. 6. Enlarged heterogeneous thyroid gland. Recommend thyroid ultrasound (ref: J Am Coll Radiol. 2015 Feb;12(2): 143-50). Electronically Signed   By: Tish Frederickson M.D.   On: 05/20/2023 23:16    EKG: My personal interpretation of EKG shows: Sinus rhythm heart rate 84.  Prolonged PR interval and right bundle branch block.  No ST and T wave abnormality     Assessment/Plan: Principal Problem:   Rhabdomyolysis Active Problems:   Mechanical fall   Acute kidney injury superimposed on chronic kidney disease stage 3A (HCC)   Elevated troponin   Essential hypertension   Prolonged P-R interval   Hyperlipidemia   Insulin dependent type 2 diabetes mellitus (HCC)   Leukocytosis   Elevated bilirubin   Transaminitis   Enlarged thyroid    Assessment and Plan: Rhabdomyolysis secondary to mechanical fall Reactive leukocytosis -Patient  coming with mechanical fall due to loss of balance of the walker. Downtime around 10 to 12 hours. -Elevated WBC count 12 in the setting of mechanical fall.  Reactive leukocytosis from rhabdomyolysis.  Patient is afebrile.  No concern for infection at this time. - Extensive imaging did not not showed any evidence of fracture. -Elevated  CK 2012 - In the ED patient received NS 500 mL bolus.  Continue IV fluid LR 125 cc/h for 1 day. - Checking CK in the morning. -Pending UA.   Mechanical fall due to loss of balance -Patient reported losing balance while using her walker which she stated that one of the wheel of the walker does not work.  She is nostra balance and fell on the ground.  Denies any hitting her head and loss of consciousness. -Chest x-ray no acute active disease process.  Aortic atherosclerosis - X-ray pelvis negative for acute traumatic injury. - CT head no acute intracranial abnormality.  No displaced facial fracture.  No displaced or traumatic cervical spine.  Multilevel severe osseous neural foraminal stenosis more severe at C3-C4 level.  Enlarged heterogenous thyroid gland.  Recommended thyroid ultrasound. - CT maxillofacial no acute intracranial abnormality and facial fracture. - CT chest abdomen pelvis showed questionable acute displaced right femoral neck fracture recommended follow-up with three-view x-ray hip.  No acute intrathoracic, intra-abdominal and intrapelvic traumatic injury.  Cholelithiasis with evidence of acute cholecystitis.  Acute arthrosclerosis. - Following x-ray of the right-sided hip right-sided hip ruled out any acute fracture or dislocation.  Right proximal femur is intact. -Checking orthostatic vital. -Continue fall precaution. - Consulted physical therapy and Occupational Therapy for balance evaluation and appropriate DME recommendation.  AKI on CKD stage 3A -Creatinine presentation 1.98.  Baseline creatinine 1.4-1.5 -AKI in the setting of rhabdomyolysis.  Direct nephrotoxic injury from hemoglobin. -Treating rhabdomyolysis with IV hydration - Checking UA, urine creatinine and urine sodium - Continue to monitor urine output - Avoid nephrotoxic agent   Elevated troponin -High since he troponin 31 >30.  Patient denies any chest pain.  EKG showed sinus rhythm heart rate 80.  Prolonged  PR interval and left bundle bundle branch block, no evidence of ST and T wave abnormality.  Unable to compare with previous EKG as there is no EKG on the chart. -Patient denies any chest pain.  ACS ruled out - Continue to trend troponin. - Admitting patient to telemetry bed with cardiac monitoring  Prolonged PR interval - EKG showed prolonged [place] and lead bundle branch block. - Unable to find any home medication on the list for now.  Patient's home medication reconciliation is in process. -Continue cardiac monitoring -Checking echocardiogram to baseline out any cardiac abnormality   Elevated bilirubin Transaminitis -Slightly elevated bilirubin 1.4 and elevated AST 71.  ALP and ALT normal range - Hyperbilirubinemia likely in the setting of hypotension on initial presentation. -Transaminitis likely secondary to hypotension as well.  Also checking hepatitis panel. -Continue monitor liver function panel   Incidental finding of enlarged thyroid gland -CT head showed incidental finding of enlarged thyroid gland.  Recommended thyroid ultrasound. - Checking TSH and T4 level.  Checking thyroid ultrasound.  Essential hypertension -Patient has history of essential hypertension.  Unknown home medication. - Consulted pharmacy for home medication reconciliation - Blood pressure labile systolic dropped to 109 and peaked up to 145. -Avoid hypotension in the setting of AKI. - Also starting hydralazine 5 Mg 8 hour if systolic blood pressure more than 160.  Insulin-dependent DM type II -Patient's  daughter reported that she takes insulin at home.  Unable to find any record on the chart.  Checking A1c. - Requested pharmacy for home medication reconciliation. - Continue carb consistent diet, low sliding scale SSI as needed with meal and POC blood glucose check with meals as well.   DVT prophylaxis:  SQ Heparin and SCD Code Status:  Full Code Diet: Carb modified diet. Family Communication:  Discussed treatment plan with both of the patient's daughter over the phone Disposition Plan: Pending improvement of CK level and AKI.  Pending PT and OT evaluation.  Tentative discharge to home next 3 to 4 days. Consults: Physical therapy and Occupational Therapy Admission status:   Inpatient, Telemetry bed  Severity of Illness: The appropriate patient status for this patient is INPATIENT. Inpatient status is judged to be reasonable and necessary in order to provide the required intensity of service to ensure the patient's safety. The patient's presenting symptoms, physical exam findings, and initial radiographic and laboratory data in the context of their chronic comorbidities is felt to place them at high risk for further clinical deterioration. Furthermore, it is not anticipated that the patient will be medically stable for discharge from the hospital within 2 midnights of admission.   * I certify that at the point of admission it is my clinical judgment that the patient will require inpatient hospital care spanning beyond 2 midnights from the point of admission due to high intensity of service, high risk for further deterioration and high frequency of surveillance required.Marland Kitchen    Tereasa Coop, MD [facility]  How to contact the [facility] Attending or Consulting provider 7A - 7P or covering provider during after hours 7P -7A, for this patient.  Check the care team in [facility] and look for a) attending/consulting TRH provider listed and b) the [facility] team listed Log into [email or web address] and use [place]'s universal password to access. If you do not have the password, please contact the hospital operator. Locate the [facility] provider you are looking for under [facility] and page to a number that you can be directly reached. If you still have difficulty reaching the provider, please page the [facility] (Director on Call) for the Hospitalists listed on amion for assistance.  05/21/2023, 5:46 AM

## 2023-05-21 NOTE — Progress Notes (Signed)
OT Cancellation Note  Patient Details Name: Katherine Cook MRN: [id number] DOB: Nov 02, 1940   Cancelled Treatment:    Reason Eval/Treat Not Completed: Patient at procedure or test/ unavailable (OTF upon arrival, OT eval to f/u when available.)  Donia Pounds 05/21/2023, 8:20 AM

## 2023-05-21 NOTE — Evaluation (Signed)
Physical Therapy Evaluation Patient Details Name: Katherine Cook MRN: [id number] DOB: 20-Jan-1941 Today's Date: 05/21/2023  History of Present Illness  Katherine Cook is a 82 y.o. female who presented on 05/20/23 after a fall at home, down 10-12 hours. Admitted for Rhabdomyolysis. Imaging of lower extremities and spine were negative for acute injury.  Head CT negative for acute changes.  PMHx:  insulin-dependent  DM type II, diabetic neuropathy, hyperlipidemia, essential hypertension and CKD stage 3A  Clinical Impression  Pt admitted with above diagnosis. At baseline, pt lives alone and ambulatory with rollator. Her daughter checks on her daily and assist with IADLs.  Pt reports this was her first fall and it occurred when her L LE gave way.  She then laid 12 hr on L side.  Today, pt requiring max x 2 for transfers and use of STEDY for transfer to chair.  She demonstrated L LE weakness compared to R and decreased sensation throughout L LE.  L UE was normal and equal to right.  Pt also with slowed and mildly slurred speech (family stepped out, unsure if baseline).  Potential L LE weak from rhabdomyolysis and positioning ;however, it also "gave way" prior to fall and mildly slurred speech - Notified MD and RN.  Pt now with pending head MRI.   Pt is well below her baseline and will benefit from acute PT services.  Pt currently with functional limitations due to the deficits listed below (see PT Problem List). Pt will benefit from acute skilled PT to increase their independence and safety with mobility to allow discharge. At discharge, Patient will benefit from continued inpatient follow up therapy, <3 hours/day           If plan is discharge home, recommend the following: Two people to help with walking and/or transfers;Two people to help with bathing/dressing/bathroom   Can travel by private vehicle   No    Equipment Recommendations Other (comment) (defer to post acute)  Recommendations for Other  Services       Functional Status Assessment Patient has had a recent decline in their functional status and demonstrates the ability to make significant improvements in function in a reasonable and predictable amount of time.     Precautions / Restrictions Precautions Precautions: Fall Restrictions Weight Bearing Restrictions: No      Mobility  Bed Mobility Overal bed mobility: Needs Assistance Bed Mobility: Sit to Supine, Rolling, Sidelying to Sit Rolling: Max assist, +2 for physical assistance, +2 for safety/equipment Sidelying to sit: Max assist, +2 for physical assistance, +2 for safety/equipment       General bed mobility comments: heavy use of rails, cues needed; rolled for ADLs then to sit    Transfers Overall transfer level: Needs assistance Equipment used: Ambulation equipment used Transfers: Sit to/from Stand, Bed to chair/wheelchair/BSC Sit to Stand: Max assist, +2 physical assistance, +2 safety/equipment, From elevated surface           General transfer comment: Attempted STS at EOB with RW and pt unable to get fully upright. Switched to [employer] and  Max A +2 to stand with increased difficulty straightening L LE.  STEDY for pivot Transfer via Lift Equipment: Stedy  Ambulation/Gait               General Gait Details: unable  Stairs            Wheelchair Mobility     Tilt Bed    Modified Rankin (Stroke Patients Only)  Balance Overall balance assessment: Needs assistance Sitting-balance support: Feet supported Sitting balance-Leahy Scale: Fair     Standing balance support: Bilateral upper extremity supported, During functional activity Standing balance-Leahy Scale: Poor Standing balance comment: And mod-max A                             Pertinent Vitals/Pain Pain Assessment Pain Assessment: Faces Faces Pain Scale: Hurts little more Pain Location: back Pain Descriptors / Indicators: Discomfort, Grimacing,  Guarding Pain Intervention(s): Limited activity within patient's tolerance, Monitored during session    Home Living Family/patient expects to be discharged to:: Private residence Living Arrangements: Alone Available Help at Discharge: Available PRN/intermittently Type of Home: House Home Access: Ramped entrance       Home Layout: One level Home Equipment: Rollator (4 wheels);Tub bench;Grab bars - tub/shower Additional Comments: reports rollator >23 years old and does not work well    Prior Function Prior Level of Function : Needs assist             Mobility Comments: Could ambulate in home with rollator ADLs Comments: Daughter assist with IADLs; pt performs ADLs. Pt drives and manages her medication     Extremity/Trunk Assessment   Upper Extremity Assessment Upper Extremity Assessment: Defer to OT evaluation    Lower Extremity Assessment Lower Extremity Assessment: LLE deficits/detail;RLE deficits/detail RLE Deficits / Details: ROM WFL; MMT: ankle 5/5, knee ext 5/5, hip at least 2/5 not further tested due to back pain LLE Deficits / Details: ROM WFL; MMT: ankle 4-/5, knee ext 4/5, hip at least 2/5 but not further tested.  Pt does report decreased sensation throughout L LE LLE Sensation: decreased light touch    Cervical / Trunk Assessment Cervical / Trunk Assessment: Kyphotic;Other exceptions Cervical / Trunk Exceptions: increased body habitus  Communication   Communication Communication: Difficulty communicating thoughts/reduced clarity of speech (mild)  Cognition Arousal: Alert Behavior During Therapy: Flat affect Overall Cognitive Status: No family/caregiver present to determine baseline cognitive functioning                                 General Comments: A&Ox4, follows all simple commands with increased time. Slowed processing with some difficulty getting words/sentences out        General Comments General comments (skin integrity, edema,  etc.):  Attempted to get orthostatic BP but potentially inaccurate readings due to positioning:  Supine 102/55 with HR 67 Sitting 139/66 with HR 68 Unable to get in standing, tried on STEDY but pt using L UE too much and unable to relax BP upon immediate sitting in chair 107/80 with HR 76   Exercises     Assessment/Plan    PT Assessment Patient needs continued PT services  PT Problem List Decreased strength;Pain;Decreased range of motion;Decreased activity tolerance;Decreased balance;Decreased mobility;Decreased knowledge of precautions;Decreased knowledge of use of DME       PT Treatment Interventions DME instruction;Therapeutic exercise;Gait training;Balance training;Stair training;Functional mobility training;Therapeutic activities;Patient/family education;Wheelchair mobility training;Neuromuscular re-education    PT Goals (Current goals can be found in the Care Plan section)  Acute Rehab PT Goals Patient Stated Goal: not stated PT Goal Formulation: With patient Time For Goal Achievement: 06/04/23 Potential to Achieve Goals: Good    Frequency Min 1X/week     Co-evaluation PT/OT/SLP Co-Evaluation/Treatment: Yes Reason for Co-Treatment: Complexity of the patient's impairments (multi-system involvement);To address functional/ADL transfers;For patient/therapist safety PT goals addressed  during session: Mobility/safety with mobility;Balance;Proper use of DME OT goals addressed during session: ADL's and self-care       AM-PAC PT "6 Clicks" Mobility  Outcome Measure Help needed turning from your back to your side while in a flat bed without using bedrails?: Total Help needed moving from lying on your back to sitting on the side of a flat bed without using bedrails?: Total Help needed moving to and from a bed to a chair (including a wheelchair)?: Total Help needed standing up from a chair using your arms (e.g., wheelchair or bedside chair)?: Total Help needed to walk in  hospital room?: Total Help needed climbing 3-5 steps with a railing? : Total 6 Click Score: 6    End of Session Equipment Utilized During Treatment: Gait belt Activity Tolerance: Patient tolerated treatment well Patient left: with chair alarm set;in chair;with call bell/phone within reach (maximove pad in place) Nurse Communication: Mobility status;Need for lift equipment;Other (comment) (concern for L LE weakness/decreased sensation/and mild slurred speech -also notified MD; alsonotified RN pt had BM on bed pan and was cleaned) PT Visit Diagnosis: Other abnormalities of gait and mobility (R26.89);Muscle weakness (generalized) (M62.81)    Time: 8119-1478 PT Time Calculation (min) (ACUTE ONLY): 35 min   Charges:   PT Evaluation $PT Eval Moderate Complexity: 1 Mod   PT General Charges $$ ACUTE PT VISIT: 1 Visit         Anise Salvo, PT Acute Rehab [facility] [phone]   Rayetta Humphrey 05/21/2023, 1:21 PM

## 2023-05-21 NOTE — Progress Notes (Addendum)
Patient seen and examined personally, I reviewed the chart, history and physical and admission note, done by admitting physician this morning and agree with the same with following addendum.  Please refer to the morning admission note for more detailed plan of care.  Briefly,  82yof CKD 3B  b/l creatinine~1.4- 1.5, IDDM T II w/ diabetic neuropathy, hyperlipidemia, essential hypertension presented after fall and was on the floor almost for 12 hours,could not call for help, also developed some numbness on the left-sided leg due to lying on that most of the day which has been resolved in ED. In ED: Tmax 100.8 BP stable not hypoxic Labs showed AKI mild elevated AST, rhabdomyolysis, mild leukocytosis and admitted for further management.   Imaging with chest x-ray, pelvis x-ray, CT head CT C-spine CT maxillofacial, CT chest abdomen pelvis obtained-no acute finding, some thyroid abnormality ultrasound thyroid recommended, question acute on nondisplaced right femoral neck fracture> right femoral neck three-view x-ray recommended-did not show any fracture. Troponins 31> 30. She was admitted for rhabdomyolysis/AKI on CKD stage IIIb, placed on IVF.   Seen examined this morning Alert awake oriented able to move all her extremities Complains of being weak Lab with creatinine uptrending  A/P Rhabdomyolysis secondary to mechanical fall: Continue IV hydration trend serial CK level-slightly better but overall the same Recent Labs  Lab 05/20/23 2137 05/21/23 0713  CKTOTAL 2,412* 2,208*    Reactive leukocytosis: Denies dysuria cough.  Watch for any signs of infection.resolved Recent Labs  Lab 05/20/23 2137 05/21/23 0713  WBC 12.0* 10.1    Mechanical fall secondary to loss of balance Denies syncopal episode or passing out.  Extensive imaging with chest x-ray x-ray pelvis CT head, CT maxillofacial CT chest abdomen pelvis including x-ray right hip see results above. obtain PT OT evaluation. Patient  reported left leg giveaway, noted to have left lower extremity weakness compared to right and decreased sensation> I have ordered MRI stat to evaluate for stroke-question of laying on the left side for prolonged.  Resulting in weakness and also rhabdomyolysis and had weakness on left it seems leading to fall at home. LKN- unknown. Pending MRI results we will discuss with neurology if she has acute stroke  Mild transaminitis: Trend labs.  AKI on CKD 3a: b/l creatinine~1.4- 1.5, creatinine trending up, suspect multifactorial in setting of dehydration and also Lasix/losartan and Celebrex at home, and rhabdomyolysis.  Avoid nephrotoxic medication avoid hypotension, continue fluid hydration monitor urine output and trend labs.  Holding home losartan, Lasix celecoxib Recent Labs    05/20/23 2137 05/21/23 0713  BUN 34* 28*  CREATININE 1.98* 2.12*  CO2 25 25    Elevated troponin: Suspect demand ischemia in the setting of volume rhabdomyolysis, no EKG changes, follow-up echocardiogram.  Prolonged PR interval: Follow-up echocardiogram  Essential hypertension: BP stable holding home meds for now in the setting of AKI and rhabdomyolysis  Incidental finding of enlarged thyroid gland: CT head showed incidental finding of enlarged thyroid gland.  Recommended thyroid ultrasound. TSH/FT4 level normal, enlarged heterogeneous thyroid without discrete nodules seen in the ultrasound-advise outpatient follow-up  IDDM type II: Blood sugar is controlled, A1c stable 6.4.  Continue SSI Recent Labs  Lab 05/21/23 0713 05/21/23 0746  GLUCAP  --  119*  HGBA1C 6.4*  --

## 2023-05-22 ENCOUNTER — Other Ambulatory Visit: Payer: Self-pay

## 2023-05-22 ENCOUNTER — Inpatient Hospital Stay (HOSPITAL_COMMUNITY): Payer: Medicare HMO

## 2023-05-22 DIAGNOSIS — M6282 Rhabdomyolysis: Secondary | ICD-10-CM | POA: Diagnosis not present

## 2023-05-22 LAB — COMPREHENSIVE METABOLIC PANEL WITH GFR
ALT: 23 U/L (ref 0–44)
AST: 62 U/L — ABNORMAL HIGH (ref 15–41)
Albumin: 2.8 g/dL — ABNORMAL LOW (ref 3.5–5.0)
Alkaline Phosphatase: 32 U/L — ABNORMAL LOW (ref 38–126)
Anion gap: 8 (ref 5–15)
BUN: 26 mg/dL — ABNORMAL HIGH (ref 8–23)
CO2: 24 mmol/L (ref 22–32)
Calcium: 8.9 mg/dL (ref 8.9–10.3)
Chloride: 109 mmol/L (ref 98–111)
Creatinine, Ser: 1.87 mg/dL — ABNORMAL HIGH (ref 0.44–1.00)
GFR, Estimated: 27 mL/min — ABNORMAL LOW (ref >60)
Glucose, Bld: 127 mg/dL — ABNORMAL HIGH (ref 70–99)
Potassium: 4 mmol/L (ref 3.5–5.1)
Sodium: 141 mmol/L (ref 135–145)
Total Bilirubin: 1.2 mg/dL (ref 0.3–1.2)
Total Protein: 6.2 g/dL — ABNORMAL LOW (ref 6.5–8.1)

## 2023-05-22 LAB — CBC
HCT: 30 % — ABNORMAL LOW (ref 36.0–46.0)
Hemoglobin: 9.9 g/dL — ABNORMAL LOW (ref 12.0–15.0)
MCH: 29.5 pg (ref 26.0–34.0)
MCHC: 33 g/dL (ref 30.0–36.0)
MCV: 89.3 fL (ref 80.0–100.0)
Platelets: 168 10*3/uL (ref 150–400)
RBC: 3.36 MIL/uL — ABNORMAL LOW (ref 3.87–5.11)
RDW: 14.1 % (ref 11.5–15.5)
WBC: 8.9 10*3/uL (ref 4.0–10.5)
nRBC: 0 % (ref 0.0–0.2)

## 2023-05-22 LAB — GLUCOSE, CAPILLARY
Glucose-Capillary: 144 mg/dL — ABNORMAL HIGH (ref 70–99)
Glucose-Capillary: 124 mg/dL — ABNORMAL HIGH (ref 70–99)
Glucose-Capillary: 135 mg/dL — ABNORMAL HIGH (ref 70–99)
Glucose-Capillary: 180 mg/dL — ABNORMAL HIGH (ref 70–99)

## 2023-05-22 LAB — CK: Total CK: 1382 U/L — ABNORMAL HIGH (ref 38–234)

## 2023-05-22 MED ORDER — DONEPEZIL HCL 10 MG PO TABS
10.0000 mg | ORAL_TABLET | Freq: Every day | ORAL | Status: DC
  Administered 2023-05-22 – 2023-06-09 (×18): 10 mg via ORAL
  Filled 2023-05-22 (×18): qty 1

## 2023-05-22 MED ORDER — LORAZEPAM 0.5 MG PO TABS
0.5000 mg | ORAL_TABLET | Freq: Once | ORAL | Status: AC | PRN
  Administered 2023-05-22: 0.5 mg via ORAL
  Filled 2023-05-22: qty 1

## 2023-05-22 MED ORDER — GABAPENTIN 100 MG PO CAPS
100.0000 mg | ORAL_CAPSULE | Freq: Two times a day (BID) | ORAL | Status: DC
  Administered 2023-05-22 – 2023-06-09 (×37): 100 mg via ORAL
  Filled 2023-05-22 (×37): qty 1

## 2023-05-22 MED ORDER — ASPIRIN 81 MG PO TBEC
81.0000 mg | DELAYED_RELEASE_TABLET | Freq: Every day | ORAL | Status: DC
  Administered 2023-05-22 – 2023-06-09 (×18): 81 mg via ORAL
  Filled 2023-05-22 (×18): qty 1

## 2023-05-22 MED ORDER — VITAMIN C 500 MG PO TABS
1000.0000 mg | ORAL_TABLET | Freq: Every day | ORAL | Status: DC
  Administered 2023-05-22 – 2023-06-09 (×18): 1000 mg via ORAL
  Filled 2023-05-22 (×18): qty 2

## 2023-05-22 MED ORDER — SODIUM CHLORIDE 0.9 % IV SOLN
1.0000 g | INTRAVENOUS | Status: AC
  Administered 2023-05-22 – 2023-05-24 (×3): 1 g via INTRAVENOUS
  Filled 2023-05-22 (×4): qty 10

## 2023-05-22 MED ORDER — PANTOPRAZOLE SODIUM 40 MG PO TBEC
40.0000 mg | DELAYED_RELEASE_TABLET | Freq: Every day | ORAL | Status: DC
  Administered 2023-05-22 – 2023-06-09 (×18): 40 mg via ORAL
  Filled 2023-05-22 (×18): qty 1

## 2023-05-22 NOTE — [place] FL2 (Signed)
[place] MEDICAID FL2 LEVEL OF CARE FORM     IDENTIFICATION  Patient Name: Katherine Cook Birthdate: 1940/12/20 Sex: female Admission Date (Current Location): 05/20/2023  [facility] [place] and [place] Number:  Producer, television/film/video and Address:  [facility], [address] [place], [place] [address]      Provider Number: [id number]  Attending Physician Name and Address:  Lanae Boast, MD  Relative Name and Phone Number:  Alona Bene (daughter) [phone]    Current Level of Care: Hospital Recommended Level of Care: Skilled Nursing Facility Prior Approval Number:    Date Approved/Denied:   PASRR Number: [id number]  Discharge Plan: SNF    Current Diagnoses: Patient Active Problem List   Diagnosis Date Noted   Mechanical fall 05/21/2023   Rhabdomyolysis 05/21/2023   Acute kidney injury superimposed on chronic kidney disease stage 3A (HCC) 05/21/2023   Essential hypertension 05/21/2023   Prolonged P-R interval 05/21/2023   Hyperlipidemia 05/21/2023   Insulin dependent type 2 diabetes mellitus (HCC) 05/21/2023   Leukocytosis 05/21/2023   Elevated troponin 05/21/2023   Elevated bilirubin 05/21/2023   Transaminitis 05/21/2023   Enlarged thyroid 05/21/2023   Dehydration 05/21/2023    Orientation RESPIRATION BLADDER Height & Weight     Time, Self, Situation, Place  Normal Incontinent, External catheter (External Urinary Catheter) Weight: 273 lb 9.5 oz (124.1 kg) Height:  5\' 10"  (177.8 cm)  BEHAVIORAL SYMPTOMS/MOOD NEUROLOGICAL BOWEL NUTRITION STATUS      Incontinent Diet (Please see discharge summary)  AMBULATORY STATUS COMMUNICATION OF NEEDS Skin   Extensive Assist Verbally Other (Comment) (Wound/Incision LDAs,Wound/Incision open or dehisced laceration,eye,R)                       Personal Care Assistance Level of Assistance  Bathing, Feeding, Dressing Bathing Assistance: Maximum assistance Feeding assistance: Limited assistance Dressing Assistance:  Limited assistance     Functional Limitations Info  Sight, Hearing, Speech Sight Info: Adequate Hearing Info: Adequate Speech Info: Adequate (WDL)    SPECIAL CARE FACTORS FREQUENCY  PT (By licensed PT), OT (By licensed OT)     PT Frequency: 5x min weekly OT Frequency: 5x min weekly            Contractures Contractures Info: Not present    Additional Factors Info  Code Status, Allergies, Insulin Sliding Scale Code Status Info: FULL Allergies Info: NKA   Insulin Sliding Scale Info: insulin aspart (novoLOG) injection 0-6 Units 3 times daily with meals       Current Medications (05/22/2023):  This is the current hospital active medication list Current Facility-Administered Medications  Medication Dose Route Frequency Provider Last Rate Last Admin   0.9 %  sodium chloride infusion  250 mL Intravenous PRN Janalyn Shy, Subrina, MD       acetaminophen (TYLENOL) tablet 650 mg  650 mg Oral Q6H PRN Janalyn Shy, Subrina, MD       Or   acetaminophen (TYLENOL) suppository 650 mg  650 mg Rectal Q6H PRN Janalyn Shy, Subrina, MD       ascorbic acid (VITAMIN C) tablet 1,000 mg  1,000 mg Oral Daily Kc, Ramesh, MD   1,000 mg at 05/22/23 1033   aspirin EC tablet 81 mg  81 mg Oral Daily Kc, Ramesh, MD   81 mg at 05/22/23 1033   cefTRIAXone (ROCEPHIN) 1 g in sodium chloride 0.9 % 100 mL IVPB  1 g Intravenous Q24H Kc, Ramesh, MD 200 mL/hr at 05/22/23 1042 1 g at 05/22/23 1042  donepezil (ARICEPT) tablet 10 mg  10 mg Oral Daily Kc, Ramesh, MD   10 mg at 05/22/23 1033   gabapentin (NEURONTIN) capsule 100 mg  100 mg Oral BID Kc, Dayna Barker, MD   100 mg at 05/22/23 1033   heparin injection 5,000 Units  5,000 Units Subcutaneous Q8H Sundil, Subrina, MD   5,000 Units at 05/22/23 0654   hydrALAZINE (APRESOLINE) injection 5 mg  5 mg Intravenous Q6H PRN Janalyn Shy, Subrina, MD       HYDROcodone-acetaminophen (NORCO/VICODIN) 5-325 MG per tablet 1-2 tablet  1-2 tablet Oral Q6H PRN Janalyn Shy, Subrina, MD       insulin aspart  (novoLOG) injection 0-6 Units  0-6 Units Subcutaneous TID WC Sundil, Subrina, MD       lactated ringers infusion   Intravenous Continuous Lanae Boast, MD 125 mL/hr at 05/22/23 0833 Infusion Verify at 05/22/23 0833   LORazepam (ATIVAN) tablet 0.5 mg  0.5 mg Oral Once PRN Lanae Boast, MD       ondansetron (ZOFRAN) tablet 4 mg  4 mg Oral Q6H PRN Janalyn Shy, Subrina, MD       Or   ondansetron [facility]) injection 4 mg  4 mg Intravenous Q6H PRN Janalyn Shy, Subrina, MD       pantoprazole (PROTONIX) EC tablet 40 mg  40 mg Oral Daily Kc, Ramesh, MD   40 mg at 05/22/23 1033   senna-docusate (Senokot-S) tablet 1 tablet  1 tablet Oral QHS PRN Janalyn Shy, Subrina, MD       sodium chloride flush (NS) 0.9 % injection 3 mL  3 mL Intravenous Q12H Sundil, Subrina, MD   3 mL at 05/22/23 1034   sodium chloride flush (NS) 0.9 % injection 3 mL  3 mL Intravenous PRN Tereasa Coop, MD         Discharge Medications: Please see discharge summary for a list of discharge medications.  Relevant Imaging Results:  Relevant Lab Results:   Additional Information [id number]  Delilah Shan, LCSWA

## 2023-05-22 NOTE — TOC Initial Note (Signed)
Transition of Care [facility]) - Initial/Assessment Note    Patient Details  Name: Katherine Cook MRN: [id number] Date of Birth: [birth year]  [facility] CM/SW Contact:    Delilah Shan, LCSWA Phone Number: 05/22/2023, 10:42 AM  Clinical Narrative:                  CSW received consult for possible SNF placement at time of discharge. CSW spoke with patient regarding PT recommendation of SNF placement at time of discharge. Patient reports PTA she comes from home alone. Patient expressed understanding of PT recommendation and is agreeable to SNF placement at time of discharge. Patient gave CSW permission to fax out initial referral for SNF placement. CSW discussed insurance authorization process. No further questions reported at this time. CSW to continue to follow and assist with discharge planning needs.   Expected Discharge Plan: Skilled Nursing Facility Barriers to Discharge: Continued Medical Work up   Patient Goals and CMS Choice Patient states their goals for this hospitalization and ongoing recovery are:: SNF CMS Medicare.gov Compare Post Acute Care list provided to:: Patient Choice offered to / list presented to : Patient      Expected Discharge Plan and Services In-house Referral: Clinical Social Work     Living arrangements for the past 2 months: Single Family Home                                      Prior Living Arrangements/Services Living arrangements for the past 2 months: Single Family Home Lives with:: Self Patient language and need for interpreter reviewed:: Yes Do you feel safe going back to the place where you live?: No   SNF  Need for Family Participation in Patient Care: Yes (Comment) Care giver support system in place?: Yes (comment)   Criminal Activity/Legal Involvement Pertinent to Current Situation/Hospitalization: No - Comment as needed  Activities of Daily Living Home Assistive Devices/Equipment: Cane (specify quad or straight),  Walker (specify type) ADL Screening (condition at time of admission) Patient's cognitive ability adequate to safely complete daily activities?: Yes Is the patient deaf or have difficulty hearing?: No Does the patient have difficulty seeing, even when wearing glasses/contacts?: No Does the patient have difficulty concentrating, remembering, or making decisions?: No Patient able to express need for assistance with ADLs?: Yes Does the patient have difficulty dressing or bathing?: No Independently performs ADLs?: Yes (appropriate for developmental age) Does the patient have difficulty walking or climbing stairs?: Yes Weakness of Legs: Both Weakness of Arms/Hands: None  Permission Sought/Granted Permission sought to share information with : Case Manager, Magazine features editor, Family Supports Permission granted to share information with : Yes, Verbal Permission Granted  Share Information with NAME: Alona Bene  Permission granted to share info w AGENCY: SNF  Permission granted to share info w Relationship: daughter  Permission granted to share info w Contact Information: Alona Bene [phone]  Emotional Assessment Appearance:: Appears stated age Attitude/Demeanor/Rapport: Gracious Affect (typically observed): Calm Orientation: : Oriented to Self, Oriented to Place, Oriented to  Time, Oriented to Situation Alcohol / Substance Use: Not Applicable Psych Involvement: No (comment)  Admission diagnosis:  Dehydration [E86.0] Elevated CK [R74.8] Fall, initial encounter [W19.XXXA] Patient Active Problem List   Diagnosis Date Noted   Mechanical fall 05/21/2023   Rhabdomyolysis 05/21/2023   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 [place], [place] - [address] [place] [address] Phone: [phone] Fax: [phone]     Social Determinants of Health (SDOH) Social History: SDOH Screenings   Food Insecurity: No Food Insecurity (05/21/2023)  Housing: Low Risk  (05/21/2023)  Transportation Needs: No Transportation Needs (05/21/2023)  Utilities: Not At Risk (05/21/2023)  Tobacco Use: Low Risk  (05/21/2023)   SDOH Interventions:     Readmission Risk Interventions     No data to display

## 2023-05-22 NOTE — Progress Notes (Signed)
PROGRESS NOTE Krystle Dreckman  ZOX:[id number] DOB: Feb 27, 1941 DOA: 05/20/2023 PCP: Earley Brooke., MD  Brief Narrative/Hospital Course: [phone] CKD 3B  b/l creatinine~1.4- 1.5, IDDM T II w/ diabetic neuropathy, hyperlipidemia, essential hypertension presented after fall and was on the floor almost for 12 hours,could not call for help, also developed some numbness on the left-sided leg due to lying on that most of the day which has been resolved in ED. In ED: Tmax 100.8 BP stable not hypoxic Labs showed AKI mild elevated AST, rhabdomyolysis, mild leukocytosis and admitted for further management.   Imaging with chest x-ray, pelvis x-ray, CT head CT C-spine CT maxillofacial, CT chest abdomen pelvis obtained-no acute finding, some thyroid abnormality ultrasound thyroid recommended, question acute on nondisplaced right femoral neck fracture> right femoral neck three-view x-ray recommended-did not show any fracture. Troponins 31> 30. She was admitted for rhabdomyolysis/AKI on CKD stage IIIb, placed on IVF.    Subjective: Seen and examined Alert awake C/o pain on while left leg, able to move  raise left leg up, says can feel some sensation on leg Overnight afebrile BP stable and not hypoxic Labs this morning shows creatinine slightly better 1.8 CK downtrending hemoglobin stable anemia UA grossly abnormal 8/10 evening  Assessment and Plan: Principal Problem:   Rhabdomyolysis Active Problems:   Mechanical fall   Acute kidney injury superimposed on chronic kidney disease stage 3A (HCC)   Elevated troponin   Essential hypertension   Prolonged P-R interval   Hyperlipidemia   Insulin dependent type 2 diabetes mellitus (HCC)   Leukocytosis   Elevated bilirubin   Transaminitis   Enlarged thyroid   Dehydration  Rhabdomyolysis secondary to mechanical fall: CK improving, continue to trend, continue IV fluids. Recent Labs  Lab 05/20/23 2137 05/21/23 0713 05/22/23 0345  CKTOTAL 2,412* 2,208*  1,382*   Pyuria: UA grossly abnormal large leukocytes positive nitrite WBC more than 50 obtain urine culture some frequency- will add rocephin.  Leukocytosis:resolved Recent Labs  Lab 05/20/23 2137 05/21/23 0713 05/22/23 0345  WBC 12.0* 10.1 8.9    Ambulatory dysfunction-using cane or walker at baseline Fall at [facility] her leg gave out and on the floor for 12 hours: She had initial sensory changes in her left foot back in November after immobilizer boot was placed for traumatic left foot fracture and has progressed to entire leg with worsening LLE weakness after the boot was removed back in March with some improvement. Has weakness of all muscle group on LLE.  With rhabdomyolysis worsening of symptoms, MRI brain no evidence of acute stroke.  Neurology input appreciated MRI C-spine and T-spine ordered  some calustrophobia- asking for anxiolytics- will ladd ativan prn . Cont PTOT  Mild transaminitis: Improving.  AKI on CKD 3a: b/l creatinine~1.4- 1.5, creatinine downtrending with IVF, trending up, suspect multifactorial in setting of dehydration and also Lasix/losartan and Celebrex at home, and rhabdomyolysis.Avoid nephrotoxic medication avoid hypotension, continue  IVF and trend labs  Recent Labs    05/20/23 2137 05/21/23 0713 05/22/23 0345  BUN 34* 28* 26*  CREATININE 1.98* 2.12* 1.87*  CO2 25 25 24     Elevated troponin: Suspect demand ischemia in the setting of volume rhabdomyolysis, no EKG changes, echo showed EF 60 to 65% left ventricle demonstrates regional wall abnormality suggestive of LBBB RV systolic function is normal severe mitral annular calcification   Prolonged PR interval: Stable echo  Essential hypertension: BP remains well-controlled-hold home meds due to AKI.   Incidental finding of enlarged thyroid gland: CT head  showed incidental finding of enlarged thyroid gland.  Recommended thyroid ultrasound. TSH/FT4 level normal, enlarged heterogeneous thyroid  without discrete nodules seen in the ultrasound-advise outpatient follow-up  IDDM type II: Blood sugar is controlled, A1c stable 6.4.  Continue SSI and Accu-Chek ACHS Recent Labs  Lab 05/21/23 0713 05/21/23 0746 05/21/23 1236 05/22/23 0837  GLUCAP  --  119* 126* 144*  HGBA1C 6.4*  --   --   --     Class II Obesity:Patient's Body mass index is 39.26 kg/m. : Will benefit with PCP follow-up, weight loss  healthy lifestyle and outpatient sleep evaluation.  Deconditioning/debility: Continue PT OT anticipating skilled nursing facility  DVT prophylaxis: heparin injection 5,000 Units Start: 05/21/23 0600 SCDs Start: 05/21/23 0450 Place TED hose Start: 05/21/23 0450 Code Status:   Code Status: Full Code Family Communication: plan of care discussed with patient  at bedside. I called and updated [name].  Patient status is: Inpatient because of AKI rhabdomyolysis and fall Level of care: Telemetry Medical  Dispo: The patient is from: Home, lives alone.            Anticipated disposition: TBD  Objective: Vitals last 24 hrs: Vitals:   05/21/23 2003 05/22/23 0043 05/22/23 0541 05/22/23 0835  BP: 125/63 127/61 129/64 133/61  Pulse: 72 73 73 72  Resp: 16 18 16 14   Temp: 98.9 F (37.2 C) 98.2 F (36.8 C) 98.7 F (37.1 C) 98.9 F (37.2 C)  TempSrc: Oral Oral Oral Oral  SpO2: 95% 96% 95% 91%  Weight:   124.1 kg   Height:       Weight change: -2.5 kg  Physical Examination: General exam: alert awake, older than stated age HEENT:Oral mucosa moist, Ear/Nose WNL grossly Respiratory system: bilaterally clear BS, no use of accessory muscle Cardiovascular system: S1 & S2 +, No JVD. Gastrointestinal system: Abdomen soft,NT,ND, BS+ Nervous System:Alert, awake, moving all her extremities- C/o pain on while left leg, able to move  raise left leg up, says can feel some sensation on leg. Extremities: LE edema mild, distal peripheral pulses palpable.  Skin: No rashes,no icterus. MSK: Normal  muscle bulk,tone, power  Medications reviewed:  Scheduled Meds:  ascorbic acid  1,000 mg Oral Daily   aspirin EC  81 mg Oral Daily   donepezil  10 mg Oral Daily   gabapentin  100 mg Oral BID   heparin  5,000 Units Subcutaneous Q8H   insulin aspart  0-6 Units Subcutaneous TID WC   pantoprazole  40 mg Oral Daily   sodium chloride flush  3 mL Intravenous Q12H   Continuous Infusions:  sodium chloride     cefTRIAXone (ROCEPHIN)  IV     lactated ringers 125 mL/hr at 05/22/23 1610    Diet Order             Diet Carb Modified Fluid consistency: Thin; Room service appropriate? Yes  Diet effective now                  Intake/Output Summary (Last 24 hours) at 05/22/2023 1015 Last data filed at 05/22/2023 9604 Gross per 24 hour  Intake 1532.84 ml  Output 2250 ml  Net -717.16 ml   Net IO Since Admission: -290.05 mL [05/22/23 1015]  Wt Readings from Last 3 Encounters:  05/22/23 124.1 kg  09/06/22 126.6 kg  07/05/19 (!) 144.2 kg     Unresulted Labs (From admission, onward)     Start     Ordered   05/22/23 0759  Urine  Culture (for pregnant, neutropenic or urologic patients or patients with an indwelling urinary catheter)  (Urine Labs)  Add-on,   AD       Question:  Indication  Answer:  Dysuria   05/22/23 0759   05/22/23 0500  CK  Daily,   R      05/21/23 1151   05/21/23 1105  Drug Screen 10 W/Conf, Serum  Once,   R        05/21/23 1104   05/21/23 0519  T3  Add-on,   AD        05/21/23 0518   05/21/23 0500  Comprehensive metabolic panel  Daily,   R      05/21/23 0449   05/21/23 0500  CBC  Daily,   R      05/21/23 0449          Data Reviewed: I have personally reviewed following labs and imaging studies CBC: Recent Labs  Lab 05/20/23 2137 05/21/23 0713 05/22/23 0345  WBC 12.0* 10.1 8.9  NEUTROABS 9.5*  --   --   HGB 10.2* 9.2* 9.9*  HCT 30.0* 27.9* 30.0*  MCV 89.6 89.4 89.3  PLT 170 158 168   Basic Metabolic Panel: Recent Labs  Lab 05/20/23 2137  05/21/23 0713 05/22/23 0345  NA 142 143 141  K 4.8 4.6 4.0  CL 106 107 109  CO2 25 25 24   GLUCOSE 210* 128* 127*  BUN 34* 28* 26*  CREATININE 1.98* 2.12* 1.87*  CALCIUM 9.2 9.0 8.9   GFR: Estimated Creatinine Clearance: 33.8 mL/min (A) (by C-G formula based on SCr of 1.87 mg/dL (H)). Liver Function Tests: Recent Labs  Lab 05/20/23 2137 05/21/23 0713 05/22/23 0345  AST 71* 71* 62*  ALT 23 23 23   ALKPHOS 34* 30* 32*  BILITOT 1.4* 1.2 1.2  PROT 6.7 6.3* 6.2*  ALBUMIN 3.4* 2.9* 2.8*   Recent Labs  Lab 05/20/23 2137  LIPASE 27   Recent Labs    05/21/23 0713  HGBA1C 6.4*   CBG: Recent Labs  Lab 05/21/23 0746 05/21/23 1236 05/22/23 0837  GLUCAP 119* 126* 144*   Recent Labs    05/21/23 0713  TSH 2.114  FREET4 1.03   Sepsis Labs: No results for input(s): "PROCALCITON", "LATICACIDVEN" in the last 168 hours.  Recent Results (from the past 240 hour(s))  SARS Coronavirus 2 by RT PCR (hospital order, performed in [facility] hospital lab) *cepheid single result test* Anterior Nasal Swab     Status: None   Collection Time: 05/20/23  9:37 PM   Specimen: Anterior Nasal Swab  Result Value Ref Range Status   SARS Coronavirus 2 by RT PCR NEGATIVE NEGATIVE Final    Comment: (NOTE) SARS-CoV-2 target nucleic acids are NOT DETECTED.  The SARS-CoV-2 RNA is generally detectable in upper and lower respiratory specimens during the acute phase of infection. The lowest concentration of SARS-CoV-2 viral copies this assay can detect is 250 copies / mL. A negative result does not preclude SARS-CoV-2 infection and should not be used as the sole basis for treatment or other patient management decisions.  A negative result may occur with improper specimen collection / handling, submission of specimen other than nasopharyngeal swab, presence of viral mutation(s) within the areas targeted by this assay, and inadequate number of viral copies (<250 copies / mL). A negative result must  be combined with clinical observations, patient history, and epidemiological information.  Fact Sheet for Patients:   RoadLapTop.co.za  Fact Sheet for Healthcare Providers:  http://kim-miller.com/  This test is not yet approved or  cleared by the [place] and has been authorized for detection and/or diagnosis of SARS-CoV-2 by FDA under an Emergency Use Authorization (EUA).  This EUA will remain in effect (meaning this test can be used) for the duration of the COVID-19 declaration under Section 564(b)(1) of the Act, 21 U.S.C. section 360bbb-3(b)(1), unless the authorization is terminated or revoked sooner.  Performed at [facility], [address] [place], [place] [address]     Antimicrobials: Anti-infectives (From admission, onward)    Start     Dose/Rate Route Frequency Ordered Stop   05/22/23 1100  cefTRIAXone (ROCEPHIN) 1 g in sodium chloride 0.9 % 100 mL IVPB        1 g 200 mL/hr over 30 Minutes Intravenous Every 24 hours 05/22/23 1015 05/25/23 1114      Culture/Microbiology    Component Value Date/Time   SDES  07/05/2019 1530    URINE, CLEAN CATCH Performed at [facility], [address] [place], [place] [address]    [facility]  07/05/2019 1530    NONE Performed at [facility], [address] [place], [place] [address]    CULT (A) 07/05/2019 1530    <10,000 COLONIES/mL INSIGNIFICANT GROWTH Performed at [facility], [address], [place], [place] [address]    REPTSTATUS 07/06/2019 FINAL 07/05/2019 1530    Radiology Studies: MR BRAIN WO CONTRAST  Result Date: 05/21/2023 CLINICAL DATA:  Stroke suspected EXAM: MRI HEAD WITHOUT CONTRAST TECHNIQUE: Multiplanar, multiecho pulse sequences of the brain and surrounding structures were obtained without intravenous contrast. COMPARISON:  CT head 1 day prior FINDINGS: Brain: There is no acute intracranial hemorrhage,  extra-axial fluid collection, or acute infarct Background parenchymal volume is normal for age. The ventricles are normal in size. Patchy and confluent FLAIR signal abnormality in the supratentorial white matter likely reflects sequela of moderate chronic small-vessel ischemic change, with small remote infarcts in the bilateral corona radiata, and basal ganglia, left thalamus, and left cerebellar hemisphere. The pituitary and suprasellar region are normal. Vascular: Normal flow voids. Skull and upper cervical spine: Normal marrow signal. Sinuses/Orbits: The paranasal sinuses are clear. Bilateral lens implants are in place. The globes and orbits are otherwise unremarkable. Other: The mastoid air cells and middle ear cavities are clear. IMPRESSION: 1. No acute intracranial pathology. 2. 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 Electronically Signed   By: Lesia Hausen M.D.   On: 05/21/2023 15:41   ECHOCARDIOGRAM COMPLETE  Result Date: 05/21/2023    ECHOCARDIOGRAM REPORT   Patient Name:   CAREL JAILLET Date of Exam: 05/21/2023 Medical Rec #:  [id number]   Height:       70.0 in Accession #:    [id number]  Weight:       269.4 lb Date of Birth:  03-04-1941   BSA:          2.369 m Patient Age:    82 years    BP:           109/49 mmHg Patient Gender: F           HR:           71 bpm. Exam Location:  Inpatient Procedure: 2D Echo, Cardiac Doppler and Color Doppler Indications:    syncope  History:        Patient has no prior history of Echocardiogram examinations.  Chronic kidney disease; Risk Factors:Hypertension, Dyslipidemia                 and Diabetes.  Sonographer:    Delcie Roch RDCS Referring Phys: [id number] SUBRINA SUNDIL  Sonographer Comments: Technically difficult study due to poor echo windows. Image acquisition challenging due to patient body habitus. IMPRESSIONS  1. Left ventricular ejection fraction, by estimation, is 60 to 65%. The left ventricle has  normal function. The left ventricle demonstrates regional wall motion abnormalities (suggestive of LBBB). There is mild concentric left ventricular hypertrophy. Left ventricular diastolic function could not be evaluated.  2. Right ventricular systolic function is normal. The right ventricular size is normal.  3. The mitral valve is degenerative. No evidence of mitral valve regurgitation. The mean mitral valve gradient is 2.0 mmHg with average heart rate of 68 bpm. Severe mitral annular calcification.  4. The aortic valve is tricuspid. There is mild calcification of the aortic valve. Aortic valve regurgitation is not visualized. Aortic valve sclerosis is present, with no evidence of aortic valve stenosis.  5. The inferior vena cava is normal in size with greater than 50% respiratory variability, suggesting right atrial pressure of 3 mmHg. Comparison(s): No prior Echocardiogram. FINDINGS  Left Ventricle: Left ventricular ejection fraction, by estimation, is 60 to 65%. The left ventricle has normal function. The left ventricle demonstrates regional wall motion abnormalities. The left ventricular internal cavity size was normal in size. There is mild concentric left ventricular hypertrophy. Abnormal (paradoxical) septal motion, consistent with left bundle branch block. Left ventricular diastolic function could not be evaluated due to mitral annular calcification (moderate or greater). Left ventricular diastolic function could not be evaluated.  LV Wall Scoring: The anterior septum, mid inferoseptal segment, and basal inferoseptal segment are hypokinetic. Right Ventricle: The right ventricular size is normal. No increase in right ventricular wall thickness. Right ventricular systolic function is normal. Left Atrium: Left atrial size was normal in size. Right Atrium: Right atrial size was normal in size. Pericardium: There is no evidence of pericardial effusion. Mitral Valve: The mitral valve is degenerative in  appearance. Severe mitral annular calcification. No evidence of mitral valve regurgitation. The mean mitral valve gradient is 2.0 mmHg with average heart rate of 68 bpm. Tricuspid Valve: The tricuspid valve is normal in structure. Tricuspid valve regurgitation is not demonstrated. No evidence of tricuspid stenosis. Aortic Valve: The aortic valve is tricuspid. There is mild calcification of the aortic valve. Aortic valve regurgitation is not visualized. Aortic valve sclerosis is present, with no evidence of aortic valve stenosis. Pulmonic Valve: The pulmonic valve was normal in structure. Pulmonic valve regurgitation is not visualized. Aorta: The aortic root and ascending aorta are structurally normal, with no evidence of dilitation. Venous: The inferior vena cava is normal in size with greater than 50% respiratory variability, suggesting right atrial pressure of 3 mmHg. IAS/Shunts: No atrial level shunt detected by color flow Doppler.  LEFT VENTRICLE PLAX 2D LVIDd:         4.80 cm   Diastology LVIDs:         3.20 cm   LV e' medial:    4.79 cm/s LV PW:         1.10 cm   LV E/e' medial:  20.2 LV IVS:        1.10 cm   LV e' lateral:   6.96 cm/s LVOT diam:     2.30 cm   LV E/e' lateral: 13.9 LV SV:  91 LV SV Index:   38 LVOT Area:     4.15 cm  RIGHT VENTRICLE             IVC RV Basal diam:  2.90 cm     IVC diam: 1.80 cm RV S prime:     12.20 cm/s LEFT ATRIUM             Index        RIGHT ATRIUM           Index LA diam:        4.00 cm 1.69 cm/m   RA Area:     14.90 cm LA Vol (A2C):   49.6 ml 20.94 ml/m  RA Volume:   38.50 ml  16.25 ml/m LA Vol (A4C):   48.9 ml 20.64 ml/m LA Biplane Vol: 50.7 ml 21.40 ml/m  AORTIC VALVE LVOT Vmax:   100.00 cm/s LVOT Vmean:  67.700 cm/s LVOT VTI:    0.218 m  AORTA Ao Root diam: 3.50 cm Ao Asc diam:  3.50 cm MITRAL VALVE MV Area (PHT): 3.12 cm     SHUNTS MV Mean grad:  2.0 mmHg     Systemic VTI:  0.22 m MV Decel Time: 243 msec     Systemic Diam: 2.30 cm MV E velocity: 96.70  cm/s MV A velocity: 115.00 cm/s MV E/A ratio:  0.84 Riley Lam MD Electronically signed by Riley Lam MD Signature Date/Time: 05/21/2023/12:03:57 PM    Final    US THYROID  Result Date: 05/21/2023 CLINICAL DATA:  Heterogeneous enlarged thyroid seen on CT EXAM: THYROID ULTRASOUND TECHNIQUE: Ultrasound examination of the thyroid gland and adjacent soft tissues was performed. COMPARISON:  CT cervical spine 05/20/2023 FINDINGS: Parenchymal Echotexture: Moderate heterogeneous Isthmus: 2.2 cm Right lobe: 7.6 x 3.8 x 3.5 cm Left lobe: 6.3 x 3.2 x 2.4 cm _________________________________________________________ Estimated total number of nodules >/= 1 cm: 0 Number of spongiform nodules >/=  2 cm not described below (TR1): 0 Number of mixed cystic and solid nodules >/= 1.5 cm not described below (TR2): 0 _________________________________________________________ No discrete nodules are seen within the thyroid gland. IMPRESSION: Enlarged heterogeneous thyroid without discrete nodule. The above is in keeping with the ACR TI-RADS recommendations - J Am Coll Radiol 2017;14:587-595. Electronically Signed   By: Acquanetta Belling M.D.   On: 05/21/2023 07:16   DG Hip Unilat W or Wo Pelvis 2-3 Views Right  Result Date: 05/21/2023 CLINICAL DATA:  Fall, possible hip fracture on CT EXAM: DG HIP (WITH OR WITHOUT PELVIS) 2-3V RIGHT COMPARISON:  CT abdomen/pelvis dated 05/20/2023 FINDINGS: No fracture or dislocation is seen. Specifically, the right proximal femur is intact. Bilateral joint spaces are preserved. Visualized bony pelvis is intact. Mild degenerative changes of the lower lumbar spine. IMPRESSION: No fracture or dislocation is seen. Specifically, the right proximal femur is intact. Electronically Signed   By: Charline Bills M.D.   On: 05/21/2023 01:37   DG Pelvis Portable  Result Date: 05/20/2023 CLINICAL DATA:  fall EXAM: PORTABLE PELVIS 1-2 VIEWS COMPARISON:  None Available. FINDINGS: Limited  evaluation due to overlapping osseous structures and overlying soft tissues. There is no evidence of pelvic fracture or diastasis. No hip dislocation bilaterally. Grossly unremarkable proximal femurs with no definite acute displaced fracture. Degenerative changes of bilateral sacroiliac joints. No pelvic bone lesions are seen. IMPRESSION: Negative for definite acute traumatic injury. Limited evaluation due to overlapping osseous structures and overlying soft tissues. Electronically Signed   By: Normajean Glasgow.D.  On: 05/20/2023 23:32   DG Chest Portable 1 View  Result Date: 05/20/2023 CLINICAL DATA:  fall EXAM: PORTABLE CHEST 1 VIEW COMPARISON:  Chest x-ray 07/05/2019 FINDINGS: The heart and mediastinal contours are within normal limits. Aortic calcification. No focal consolidation. No pulmonary edema. No pleural effusion. No pneumothorax. No acute osseous abnormality. IMPRESSION: 1. No active disease. 2.  Aortic Atherosclerosis (ICD10-I70.0). Electronically Signed   By: Tish Frederickson M.D.   On: 05/20/2023 23:30   CT CHEST ABDOMEN PELVIS WO CONTRAST  Result Date: 05/20/2023 CLINICAL DATA:  Polytrauma, blunt EXAM: CT CHEST, ABDOMEN AND PELVIS WITHOUT CONTRAST TECHNIQUE: Multidetector CT imaging of the chest, abdomen and pelvis was performed following the standard protocol without IV contrast. RADIATION DOSE REDUCTION: This exam was performed according to the departmental dose-optimization program which includes automated exposure control, adjustment of the mA and/or kV according to patient size and/or use of iterative reconstruction technique. COMPARISON:  MRI right hip 02/14/2023, x-ray pelvis 05/20/2023 FINDINGS: CHEST: Cardiovascular: The thoracic aorta is normal in caliber. The heart is normal in size. No significant pericardial effusion. Mitral annular calcification. Left anterior descending coronary calcification. Mild atherosclerotic plaque of the aorta. Lungs/Pleura: No focal consolidation. No  pulmonary nodule. No pulmonary mass. No pulmonary contusion or laceration. No pneumatocele formation. No pleural effusion. No pneumothorax. No hemothorax. Mediastinum/Nodes: No pneumomediastinum. The central airways are patent. The esophagus is unremarkable. Tiny hiatal hernia. Enlarged and heterogeneous thyroid gland-follow-up with ultrasound. Limited evaluation for hilar lymphadenopathy on this noncontrast study. No mediastinal or axillary lymphadenopathy. Musculoskeletal/Chest wall No chest wall mass. No acute rib or sternal fracture. No spinal fracture. Multilevel bulky osteophyte formation. ABDOMEN / PELVIS: Hepatobiliary: Not enlarged. No focal lesion. Peripherally calcified gallstone within the gallbladder lumen. Gallstone measures up to 3 cm. No biliary ductal dilatation. Pancreas: Normal pancreatic contour. No main pancreatic duct dilatation. Spleen: Not enlarged. No focal lesion. Adrenals/Urinary Tract: No nodularity bilaterally. No hydroureteronephrosis. No nephroureterolithiasis. No contour deforming renal mass. The urinary bladder is unremarkable. Stomach/Bowel: No small or large bowel wall thickening or dilatation. The appendix is unremarkable. Vasculature/Lymphatic: Mild to moderate atherosclerotic plaque. No abdominal aorta or iliac aneurysm. No abdominal, pelvic, inguinal lymphadenopathy. Reproductive: Status post hysterectomy. Bilateral adnexa are unremarkable. Other: No simple free fluid ascites. No pneumoperitoneum. No mesenteric hematoma identified. No organized fluid collection. Musculoskeletal: No significant soft tissue hematoma. Question acute nondisplaced right femoral neck fracture (2:116). No spinal fracture. Multilevel moderate severe degenerative changes of the spine multilevel intervertebral disc space vacuum phenomenon and mild retrolisthesis of L5 on S1. Ports and Devices: None. IMPRESSION: 1. Question acute nondisplaced right femoral neck fracture. Recommend dedicated three view  right hip radiograph for further evaluation. 2. No acute intrathoracic, intra-abdominal, intrapelvic traumatic injury with limited evaluation on this noncontrast study. 3. No acute fracture or traumatic malalignment of the thoracic or lumbar spine. 4. Other imaging findings of potential clinical significance: Tiny hiatal hernia. Cholelithiasis with no CT evidence of acute cholecystitis. Aortic Atherosclerosis (ICD10-I70.0) including mitral annular and left anterior descending coronary calcification. Electronically Signed   By: Tish Frederickson M.D.   On: 05/20/2023 23:29   CT Head Wo Contrast  Result Date: 05/20/2023 CLINICAL DATA:  Head trauma, minor (Age >= 65y); Neck trauma (Age >= 65y); Facial trauma, blunt EXAM: CT HEAD WITHOUT CONTRAST CT MAXILLOFACIAL WITHOUT CONTRAST CT CERVICAL SPINE WITHOUT CONTRAST TECHNIQUE: Multidetector CT imaging of the head, cervical spine, and maxillofacial structures were performed using the standard protocol without intravenous contrast. Multiplanar CT image reconstructions of the cervical  spine and maxillofacial structures were also generated. RADIATION DOSE REDUCTION: This exam was performed according to the departmental dose-optimization program which includes automated exposure control, adjustment of the mA and/or kV according to patient size and/or use of iterative reconstruction technique. COMPARISON:  CT head 07/02/2017 FINDINGS: CT HEAD FINDINGS Brain: Trace patchy and confluent areas of decreased attenuation are noted throughout the deep and periventricular white matter of the cerebral hemispheres bilaterally, compatible with chronic microvascular ischemic disease. No evidence of large-territorial acute infarction. No parenchymal hemorrhage. No mass lesion. No extra-axial collection. No mass effect or midline shift. No hydrocephalus. Basilar cisterns are patent. Vascular: No hyperdense vessel. Skull: No acute fracture or focal lesion. Other: None. CT MAXILLOFACIAL  FINDINGS Osseous: No fracture or mandibular dislocation. No destructive process. Sinuses/Orbits: Paranasal sinuses and mastoid air cells are clear. Bilateral lens replacement. Otherwise the orbits are unremarkable. Soft tissues: Negative. CT CERVICAL SPINE FINDINGS Alignment: Normal. Skull base and vertebrae: Multilevel severe degenerative changes of the spine with associated multilevel severe osseous neural foraminal stenosis. No severe osseous central canal stenosis. At least moderate osseous central canal stenosis at the C3-C4 level due to bulky posterior disc osteophyte complex formation and posterior longitudinal ligament calcification. No acute fracture. No aggressive appearing focal osseous lesion or focal pathologic process. Soft tissues and spinal canal: No prevertebral fluid or swelling. No visible canal hematoma. Upper chest: Unremarkable. Other: Enlarged heterogeneous thyroid gland. IMPRESSION: 1. No acute intracranial abnormality. 2.  No acute displaced facial fracture. 3. No acute displaced fracture or traumatic listhesis of the cervical spine. 4. Multilevel severe osseous neural foraminal stenosis. 5. At least moderate osseous central canal stenosis at the C3-C4 level. 6. Enlarged heterogeneous thyroid gland. Recommend thyroid ultrasound (ref: J Am Coll Radiol. 2015 Feb;12(2): 143-50). Electronically Signed   By: Tish Frederickson M.D.   On: 05/20/2023 23:16   CT Cervical Spine Wo Contrast  Result Date: 05/20/2023 CLINICAL DATA:  Head trauma, minor (Age >= 65y); Neck trauma (Age >= 65y); Facial trauma, blunt EXAM: CT HEAD WITHOUT CONTRAST CT MAXILLOFACIAL WITHOUT CONTRAST CT CERVICAL SPINE WITHOUT CONTRAST TECHNIQUE: Multidetector CT imaging of the head, cervical spine, and maxillofacial structures were performed using the standard protocol without intravenous contrast. Multiplanar CT image reconstructions of the cervical spine and maxillofacial structures were also generated. RADIATION DOSE  REDUCTION: This exam was performed according to the departmental dose-optimization program which includes automated exposure control, adjustment of the mA and/or kV according to patient size and/or use of iterative reconstruction technique. COMPARISON:  CT head 07/02/2017 FINDINGS: CT HEAD FINDINGS Brain: Trace patchy and confluent areas of decreased attenuation are noted throughout the deep and periventricular white matter of the cerebral hemispheres bilaterally, compatible with chronic microvascular ischemic disease. No evidence of large-territorial acute infarction. No parenchymal hemorrhage. No mass lesion. No extra-axial collection. No mass effect or midline shift. No hydrocephalus. Basilar cisterns are patent. Vascular: No hyperdense vessel. Skull: No acute fracture or focal lesion. Other: None. CT MAXILLOFACIAL FINDINGS Osseous: No fracture or mandibular dislocation. No destructive process. Sinuses/Orbits: Paranasal sinuses and mastoid air cells are clear. Bilateral lens replacement. Otherwise the orbits are unremarkable. Soft tissues: Negative. CT CERVICAL SPINE FINDINGS Alignment: Normal. Skull base and vertebrae: Multilevel severe degenerative changes of the spine with associated multilevel severe osseous neural foraminal stenosis. No severe osseous central canal stenosis. At least moderate osseous central canal stenosis at the C3-C4 level due to bulky posterior disc osteophyte complex formation and posterior longitudinal ligament calcification. No acute fracture. No aggressive appearing focal  osseous lesion or focal pathologic process. Soft tissues and spinal canal: No prevertebral fluid or swelling. No visible canal hematoma. Upper chest: Unremarkable. Other: Enlarged heterogeneous thyroid gland. IMPRESSION: 1. No acute intracranial abnormality. 2.  No acute displaced facial fracture. 3. No acute displaced fracture or traumatic listhesis of the cervical spine. 4. Multilevel severe osseous neural  foraminal stenosis. 5. At least moderate osseous central canal stenosis at the C3-C4 level. 6. Enlarged heterogeneous thyroid gland. Recommend thyroid ultrasound (ref: J Am Coll Radiol. 2015 Feb;12(2): 143-50). Electronically Signed   By: Tish Frederickson M.D.   On: 05/20/2023 23:16   CT Maxillofacial Wo Contrast  Result Date: 05/20/2023 CLINICAL DATA:  Head trauma, minor (Age >= 65y); Neck trauma (Age >= 65y); Facial trauma, blunt EXAM: CT HEAD WITHOUT CONTRAST CT MAXILLOFACIAL WITHOUT CONTRAST CT CERVICAL SPINE WITHOUT CONTRAST TECHNIQUE: Multidetector CT imaging of the head, cervical spine, and maxillofacial structures were performed using the standard protocol without intravenous contrast. Multiplanar CT image reconstructions of the cervical spine and maxillofacial structures were also generated. RADIATION DOSE REDUCTION: This exam was performed according to the departmental dose-optimization program which includes automated exposure control, adjustment of the mA and/or kV according to patient size and/or use of iterative reconstruction technique. COMPARISON:  CT head 07/02/2017 FINDINGS: CT HEAD FINDINGS Brain: Trace patchy and confluent areas of decreased attenuation are noted throughout the deep and periventricular white matter of the cerebral hemispheres bilaterally, compatible with chronic microvascular ischemic disease. No evidence of large-territorial acute infarction. No parenchymal hemorrhage. No mass lesion. No extra-axial collection. No mass effect or midline shift. No hydrocephalus. Basilar cisterns are patent. Vascular: No hyperdense vessel. Skull: No acute fracture or focal lesion. Other: None. CT MAXILLOFACIAL FINDINGS Osseous: No fracture or mandibular dislocation. No destructive process. Sinuses/Orbits: Paranasal sinuses and mastoid air cells are clear. Bilateral lens replacement. Otherwise the orbits are unremarkable. Soft tissues: Negative. CT CERVICAL SPINE FINDINGS Alignment: Normal. Skull  base and vertebrae: Multilevel severe degenerative changes of the spine with associated multilevel severe osseous neural foraminal stenosis. No severe osseous central canal stenosis. At least moderate osseous central canal stenosis at the C3-C4 level due to bulky posterior disc osteophyte complex formation and posterior longitudinal ligament calcification. No acute fracture. No aggressive appearing focal osseous lesion or focal pathologic process. Soft tissues and spinal canal: No prevertebral fluid or swelling. No visible canal hematoma. Upper chest: Unremarkable. Other: Enlarged heterogeneous thyroid gland. IMPRESSION: 1. No acute intracranial abnormality. 2.  No acute displaced facial fracture. 3. No acute displaced fracture or traumatic listhesis of the cervical spine. 4. Multilevel severe osseous neural foraminal stenosis. 5. At least moderate osseous central canal stenosis at the C3-C4 level. 6. Enlarged heterogeneous thyroid gland. Recommend thyroid ultrasound (ref: J Am Coll Radiol. 2015 Feb;12(2): 143-50). Electronically Signed   By: Tish Frederickson M.D.   On: 05/20/2023 23:16     LOS: 1 day   Lanae Boast, MD [facility]  05/22/2023, 10:15 AM

## 2023-05-22 NOTE — Plan of Care (Signed)
Problem: Education: Goal: Knowledge of General Education information will improve Description: Including pain rating scale, medication(s)/side effects and non-pharmacologic comfort measures Outcome: Not Progressing Pt does not understands why she was admitted into the hospital s/p fall at home d/t weakness, inability to get up on her own and LLE numbness.  She tried to activate her medical alert but was unsuccessful per MD's notes.  She sustained a right orbital and right elbow laceration.      Problem: Clinical Measurements: Goal: Ability to maintain clinical measurements within normal limits will improve Outcome: Progressing Pt's VS WNL. While she is here per MD's orders she is on continuous telemetry monitoring.    Problem: Clinical Measurements: Goal: Will remain free from infection Outcome: Progressing S/Sx of infection monitored and assessed q8 hours.  Pt has remained afebrile thus far.  She is on IV abx per MD's orders    Problem: Clinical Measurements: Goal: Respiratory complications will improve Outcome: Not Progressing Respiratory status monitored and assessed q4 hours.  Pt is on room air with PO2 saturations at 91-96% and respirations of 14-15 breaths per minute.  She has not endorse c/o SOB and DOE     Problem: Activity: Goal: Risk for activity intolerance will decrease Outcome: Not Progressing Pt is dependent of all her ADLs. She needs the assistance of RN staff to get her OOB and turn/ reposition herself.    Problem: Nutrition: Goal: Adequate nutrition will be maintained Outcome: Progressing Pt is on a carb modified diet per MD's orders.  She has been able to tolerate her diet without s/sx of n/v or abdominal pain/ distention.   Problem: Elimination: Goal: Will not experience complications related to bowel motility Outcome: Progressing Pt LBM was in 05/21/2023.  She has not endorse c/o constipation.    Problem: Elimination: Goal: Will not experience complications  related to urinary retention Outcome: Progressing Pt has denied c/o dysuria or abdominal distention/ pain.  She has an external female catheter (purewick) in place to capture her urine.   Problem: Pain Management: Goal: General experience of comfort will improve Outcome: Progressing Pt has denied c/o pain thus far.    Problem: Safety: Goal: Ability to remain free from injury will improve Outcome: Progressing Pt has remained free from falls thus far.  Instructed pt to utilize RN call light for assistance.  Hourly rounds performed.  Bed alarm implemented to keep pt safe from falls.  Settings activated to third most sensitive mode.  Bed in lowest position, locked with two upper side rails engaged.  Belongings and call light within reach.    Problem: Skin Integrity: Goal: Risk for impaired skin integrity will decrease Outcome: Progressing Skin integrity monitored and assessed q-shift.  Pt is on q2 hourly turns to prevent further skin impairment.  Tubes and drains assessed for device related pressure sores.  Pt is incontinent of both bowel and bladder.  She is checked q2 hours for bowel incontinence.  Perineal care given promptly after each episode.  She has an external female catheter (purewick) in place to capture her urine.  Management of external female catheter (purewick) performed per [facility] policy, procedure and guidelines.

## 2023-05-22 NOTE — Plan of Care (Addendum)
Pt Alert and oriented x 4. Med compliant. Takes meds whole. Tolerating diet. BS x 4. Last bm 8/10. Pt incontinent of bowel and bladder. Purewick in place. RN changed and pt turned approx every 2 hours by RN. Foam patched replaced to sacrum no skin breakdown noted. Pt denies pain. No prns given. Vitals stable. BS was 150 at hs not flowing into epic.  Problem: Education: Goal: Knowledge of General Education information will improve Description: Including pain rating scale, medication(s)/side effects and non-pharmacologic comfort measures Outcome: Progressing   Problem: Health Behavior/Discharge Planning: Goal: Ability to manage health-related needs will improve Outcome: Progressing   Problem: Clinical Measurements: Goal: Ability to maintain clinical measurements within normal limits will improve Outcome: Progressing Goal: Will remain free from infection Outcome: Progressing Goal: Diagnostic test results will improve Outcome: Progressing Goal: Respiratory complications will improve Outcome: Progressing Goal: Cardiovascular complication will be avoided Outcome: Progressing   Problem: Activity: Goal: Risk for activity intolerance will decrease Outcome: Progressing   Problem: Nutrition: Goal: Adequate nutrition will be maintained Outcome: Progressing   Problem: Coping: Goal: Level of anxiety will decrease Outcome: Progressing   Problem: Elimination: Goal: Will not experience complications related to bowel motility Outcome: Progressing Goal: Will not experience complications related to urinary retention Outcome: Progressing   Problem: Pain Managment: Goal: General experience of comfort will improve Outcome: Progressing   Problem: Safety: Goal: Ability to remain free from injury will improve Outcome: Progressing   Problem: Skin Integrity: Goal: Risk for impaired skin integrity will decrease Outcome: Progressing   Problem: Education: Goal: Ability to describe self-care  measures that may prevent or decrease complications (Diabetes Survival Skills Education) will improve Outcome: Progressing Goal: Individualized Educational Video(s) Outcome: Progressing   Problem: Coping: Goal: Ability to adjust to condition or change in health will improve Outcome: Progressing   Problem: Fluid Volume: Goal: Ability to maintain a balanced intake and output will improve Outcome: Progressing   Problem: Health Behavior/Discharge Planning: Goal: Ability to identify and utilize available resources and services will improve Outcome: Progressing Goal: Ability to manage health-related needs will improve Outcome: Progressing   Problem: Metabolic: Goal: Ability to maintain appropriate glucose levels will improve Outcome: Progressing   Problem: Nutritional: Goal: Maintenance of adequate nutrition will improve Outcome: Progressing Goal: Progress toward achieving an optimal weight will improve Outcome: Progressing   Problem: Skin Integrity: Goal: Risk for impaired skin integrity will decrease Outcome: Progressing   Problem: Tissue Perfusion: Goal: Adequacy of tissue perfusion will improve Outcome: Progressing

## 2023-05-23 DIAGNOSIS — M6282 Rhabdomyolysis: Secondary | ICD-10-CM | POA: Diagnosis not present

## 2023-05-23 DIAGNOSIS — I447 Left bundle-branch block, unspecified: Secondary | ICD-10-CM | POA: Diagnosis not present

## 2023-05-23 DIAGNOSIS — Z01810 Encounter for preprocedural cardiovascular examination: Secondary | ICD-10-CM | POA: Diagnosis not present

## 2023-05-23 DIAGNOSIS — W19XXXA Unspecified fall, initial encounter: Secondary | ICD-10-CM | POA: Diagnosis not present

## 2023-05-23 LAB — T3: T3, Total: 77 ng/dL (ref 71–180)

## 2023-05-23 LAB — GLUCOSE, CAPILLARY
Glucose-Capillary: 131 mg/dL — ABNORMAL HIGH (ref 70–99)
Glucose-Capillary: 150 mg/dL — ABNORMAL HIGH (ref 70–99)
Glucose-Capillary: 99 mg/dL (ref 70–99)
Glucose-Capillary: 127 mg/dL — ABNORMAL HIGH (ref 70–99)
Glucose-Capillary: 132 mg/dL — ABNORMAL HIGH (ref 70–99)

## 2023-05-23 NOTE — Progress Notes (Signed)
PROGRESS NOTE Katherine Cook  WUJ:[id number] DOB: 02-28-1941 DOA: 05/20/2023 PCP: Earley Brooke., MD  Brief Narrative/Hospital Course: [phone] CKD 3B  b/l creatinine~1.4- 1.5, IDDM T II w/ diabetic neuropathy, hyperlipidemia, essential hypertension presented after fall and was on the floor almost for 12 hours,could not call for help, also developed some numbness on the left-sided leg due to lying on that most of the day which has been resolved in ED. In ED: Tmax 100.8 BP stable not hypoxic Labs showed AKI mild elevated AST, rhabdomyolysis, mild leukocytosis and admitted for further management.   Imaging with chest x-ray, pelvis x-ray, CT head CT C-spine CT maxillofacial, CT chest abdomen pelvis obtained-no acute finding, some thyroid abnormality ultrasound thyroid recommended, question acute on nondisplaced right femoral neck fracture> right femoral neck three-view x-ray recommended-did not show any fracture. Troponins 31> 30. She was admitted for rhabdomyolysis/AKI on CKD stage IIIb, placed on IVF.    Subjective: Seen and examined, Overnight no new complaints Feels abt the same with left leg weakness and decreased sensation  Assessment and Plan: Principal Problem:   Rhabdomyolysis Active Problems:   Mechanical fall   Acute kidney injury superimposed on chronic kidney disease stage 3A (HCC)   Elevated troponin   Essential hypertension   Prolonged P-R interval   Hyperlipidemia   Insulin dependent type 2 diabetes mellitus (HCC)   Leukocytosis   Elevated bilirubin   Transaminitis   Enlarged thyroid   Dehydration  Rhabdomyolysis secondary to mechanical fall: CK improving as below. Continue to trend, keep IVF Recent Labs  Lab 05/20/23 2137 05/21/23 0713 05/22/23 0345 05/23/23 0140  CKTOTAL 2,412* 2,208* 1,382* 667*   Pyuria Leukocytosis: UA grossly abnormal large leukocytes positive nitrite WBC more than 50 obtain urine culture.  Rocephin added 8/11-open patient denies  significant new symptoms or fever,urine culture pending. ransient leukocytosis has resolved   Ambulatory dysfunction-using cane or walker at baseline Fall at [facility] her leg gave out and on the floor for 12 hours Cevical myelopathy Thoracic myelopathy: She had initial sensory changes in her left foot back in November after immobilizer boot was placed for traumatic left foot fracture and has progressed to entire leg with worsening LLE weakness after the boot was removed back in March with some improvement. Patient noted to have weakness of all muscle group on left lower extremity>MRI brain no evidence of acute stroke.  Neurology input appreciated MRI C-spine and T-spine obtained overnight : "Severe spinal canal stenosis at C3-4 and C4-5 with spinal cord compression and hyperintense T2-weighted signal within the spinal cord at the C3-4 level, consistent with compressive myelopathy.Large left subarticular disc protrusion superimposed on a diffuse disc bulge at T11-12 causing severe spinal canal stenosis with impingement of the spinal cord. There is also severe bilateral neural foraminal stenosis at this level. Severe bilateral C4-5 and right C5-6 neural foraminal stenosis" Discussed with neurology this morning and per recommendation consult neurosurgery, updated Emilee Hero.  AKI on CKD 3a: b/l creatinine~1.65 in 08/20/22 w/ egfr  31> stable creatinine was around 1.5> on admission up to 2.1 AKI versus progression of CKD > creatinine does look slightly better on IV fluid hydration. Rhabdomyolysis improving. Cont gentle IVF, continue to hold  Lasix/losartan and Celebrex. Recent Labs    05/20/23 2137 05/21/23 0713 05/22/23 0345 05/23/23 0140  BUN 34* 28* 26* 21  CREATININE 1.98* 2.12* 1.87* 1.85*  CO2 25 25 24 24     Elevated troponin: Suspect demand ischemia in the setting of volume rhabdomyolysis, no EKG changes,  echo showed EF 60 to 65% left ventricle demonstrates regional wall  abnormality suggestive of LBBB RV systolic function is normal severe mitral annular calcification. Will get cardio eval for preoperative risk stratifications.  Prolonged PR interval: Stable echo.  Mild transaminitis: Improving.  Essential hypertension: BP well-controlled. Home meds on hold  Incidental finding of enlarged thyroid gland: CT head showed incidental finding of enlarged thyroid gland.Recommended thyroid ultrasound. TSH/FT4 level normal,enlarged heterogeneous thyroid without discrete nodules seen in the ultrasound-advise outpatient follow-up.  IDDM type II: Blood sugar is controlled,A1c stable 6.4.Continue SSI and Accu-Chek ACHS. Recent Labs  Lab 05/21/23 0713 05/21/23 0746 05/22/23 0837 05/22/23 1203 05/22/23 1630 05/22/23 2123 05/23/23 0754  GLUCAP  --    < > 144* 124* 135* 180* 99  HGBA1C 6.4*  --   --   --   --   --   --    < > = values in this interval not displayed.    Class II Obesity: Patient's Body mass index is 39.26 kg/m. : Will benefit with PCP follow-up, weight loss  healthy lifestyle and outpatient sleep evaluation.  Baseline memory issues: on donepezil,she is alert awake oriented and fairly stable cognitive status.   Deconditioning/debility: Continue PT OT anticipating skilled nursing facility  DVT prophylaxis: heparin injection 5,000 Units Start: 05/21/23 0600 SCDs Start: 05/21/23 0450 Place TED hose Start: 05/21/23 0450 Code Status:   Code Status: Full Code Family Communication: plan of care discussed with patient  at bedside. I had called and updated [name] previously, no answer when called on 8/12, called [name]  05/23/23 and updated.  Patient status is: Inpatient because of AKI rhabdomyolysis and fall Level of care: Telemetry Medical  Dispo: The patient is from: Home, lives alone.            Anticipated disposition: TBD  Objective: Vitals last 24 hrs: Vitals:   05/22/23 1629 05/22/23 2026 05/23/23 0006 05/23/23 0758  BP: (!)  122/59 131/64 (!) 108/49 137/63  Pulse: 64 73 69 68  Resp: 14 18 17 18   Temp: 98.8 F (37.1 C) 99.5 F (37.5 C) 98.5 F (36.9 C) 98.7 F (37.1 C)  TempSrc: Oral Oral Oral Oral  SpO2: 93% 94% 95% 92%  Weight:      Height:      Weight change:   Physical Examination: General exam: alert awake, oriented x3 HEENT:Oral mucosa moist, Ear/Nose WNL grossly Respiratory system: Bilaterally clear BS,no use of accessory muscle Cardiovascular system: S1 & S2 +, No JVD. Gastrointestinal system: Abdomen soft,NT,ND, BS+ Nervous System: Alert, awake, moving all extremities, but weakness some on LLE Extremities: LE edema neg,distal peripheral pulses palpable and warm.  Skin: No rashes,no icterus. MSK: Normal muscle bulk,tone, power.   Medications reviewed:  Scheduled Meds:  ascorbic acid  1,000 mg Oral Daily   aspirin EC  81 mg Oral Daily   donepezil  10 mg Oral Daily   gabapentin  100 mg Oral BID   heparin  5,000 Units Subcutaneous Q8H   insulin aspart  0-6 Units Subcutaneous TID WC   pantoprazole  40 mg Oral Daily   sodium chloride flush  3 mL Intravenous Q12H  Continuous Infusions:  sodium chloride     cefTRIAXone (ROCEPHIN)  IV Stopped (05/22/23 1112)   lactated ringers 125 mL/hr at 05/23/23 0249    Diet Order             Diet Carb Modified Fluid consistency: Thin; Room service appropriate? Yes  Diet effective now  Intake/Output Summary (Last 24 hours) at 05/23/2023 0811 Last data filed at 05/23/2023 0755 Gross per 24 hour  Intake 935.59 ml  Output 2350 ml  Net -1414.41 ml   Net IO Since Admission: -2,057.05 mL [05/23/23 0811]  Wt Readings from Last 3 Encounters:  05/22/23 124.1 kg  09/06/22 126.6 kg  07/05/19 (!) 144.2 kg   Unresulted Labs (From admission, onward)     Start     Ordered   05/23/23 0500  Basic metabolic panel  Daily,   R      05/22/23 1016   05/22/23 0759  Urine Culture (for pregnant, neutropenic or urologic patients or patients with  an indwelling urinary catheter)  (Urine Labs)  Add-on,   AD       Question:  Indication  Answer:  Dysuria   05/22/23 0759   05/22/23 0500  CK  Daily,   R      05/21/23 1151   05/21/23 1105  Drug Screen 10 W/Conf, Serum  Once,   R        05/21/23 1104   05/21/23 0500  Comprehensive metabolic panel  Daily,   R      05/21/23 0449   05/21/23 0500  CBC  Daily,   R      05/21/23 0449          Data Reviewed: I have personally reviewed following labs and imaging studies CBC: Recent Labs  Lab 05/20/23 2137 05/21/23 0713 05/22/23 0345 05/23/23 0140  WBC 12.0* 10.1 8.9 8.6  NEUTROABS 9.5*  --   --   --   HGB 10.2* 9.2* 9.9* 8.8*  HCT 30.0* 27.9* 30.0* 26.8*  MCV 89.6 89.4 89.3 89.6  PLT 170 158 168 166   Basic Metabolic Panel: Recent Labs  Lab 05/20/23 2137 05/21/23 0713 05/22/23 0345 05/23/23 0140  NA 142 143 141 141  K 4.8 4.6 4.0 4.4  CL 106 107 109 108  CO2 25 25 24 24   GLUCOSE 210* 128* 127* 124*  BUN 34* 28* 26* 21  CREATININE 1.98* 2.12* 1.87* 1.85*  CALCIUM 9.2 9.0 8.9 8.8*   GFR: Estimated Creatinine Clearance: 34.1 mL/min (A) (by C-G formula based on SCr of 1.85 mg/dL (H)). Liver Function Tests: Recent Labs  Lab 05/20/23 2137 05/21/23 0713 05/22/23 0345 05/23/23 0140  AST 71* 71* 62* 44*  ALT 23 23 23 22   ALKPHOS 34* 30* 32* 30*  BILITOT 1.4* 1.2 1.2 0.7  PROT 6.7 6.3* 6.2* 5.9*  ALBUMIN 3.4* 2.9* 2.8* 2.5*   Recent Labs  Lab 05/20/23 2137  LIPASE 27   Recent Labs    05/21/23 0713  HGBA1C 6.4*   CBG: Recent Labs  Lab 05/22/23 0837 05/22/23 1203 05/22/23 1630 05/22/23 2123 05/23/23 0754  GLUCAP 144* 124* 135* 180* 99   Recent Labs    05/21/23 0713  TSH 2.114  FREET4 1.03   Sepsis Labs: No results for input(s): "PROCALCITON", "LATICACIDVEN" in the last 168 hours.  Recent Results (from the past 240 hour(s))  SARS Coronavirus 2 by RT PCR (hospital order, performed in [facility] hospital lab) *cepheid single result test* Anterior  Nasal Swab     Status: None   Collection Time: 05/20/23  9:37 PM   Specimen: Anterior Nasal Swab  Result Value Ref Range Status   SARS Coronavirus 2 by RT PCR NEGATIVE NEGATIVE Final    Comment: (NOTE) SARS-CoV-2 target nucleic acids are NOT DETECTED.  The SARS-CoV-2 RNA is generally detectable in upper and  lower respiratory specimens during the acute phase of infection. The lowest concentration of SARS-CoV-2 viral copies this assay can detect is 250 copies / mL. A negative result does not preclude SARS-CoV-2 infection and should not be used as the sole basis for treatment or other patient management decisions.  A negative result may occur with improper specimen collection / handling, submission of specimen other than nasopharyngeal swab, presence of viral mutation(s) within the areas targeted by this assay, and inadequate number of viral copies (<250 copies / mL). A negative result must be combined with clinical observations, patient history, and epidemiological information.  Fact Sheet for Patients:   RoadLapTop.co.za  Fact Sheet for Healthcare Providers: http://kim-miller.com/  This test is not yet approved or  cleared by the [place] FDA and has been authorized for detection and/or diagnosis of SARS-CoV-2 by FDA under an Emergency Use Authorization (EUA).  This EUA will remain in effect (meaning this test can be used) for the duration of the COVID-19 declaration under Section 564(b)(1) of the Act, 21 U.S.C. section 360bbb-3(b)(1), unless the authorization is terminated or revoked sooner.  Performed at [facility], [address] [place], [place] [address]     Antimicrobials: Anti-infectives (From admission, onward)    Start     Dose/Rate Route Frequency Ordered Stop   05/22/23 1100  cefTRIAXone (ROCEPHIN) 1 g in sodium chloride 0.9 % 100 mL IVPB        1 g 200 mL/hr over 30 Minutes Intravenous Every 24 hours  05/22/23 1015 05/25/23 1114      Culture/Microbiology    Component Value Date/Time   SDES  07/05/2019 1530    URINE, CLEAN CATCH Performed at [facility], [address], [place], [place] [address]    [facility]  07/05/2019 1530    NONE Performed at [facility], [address] [place], [place] [address]    CULT (A) 07/05/2019 1530    <10,000 COLONIES/mL INSIGNIFICANT GROWTH Performed at [facility], [address], [place], [place] [address]    REPTSTATUS 07/06/2019 FINAL 07/05/2019 1530    Radiology Studies: MR THORACIC SPINE WO CONTRAST  Result Date: 05/22/2023 CLINICAL DATA:  Ataxia EXAM: MRI CERVICAL AND THORACIC SPINE WITHOUT CONTRAST TECHNIQUE: Multiplanar and multiecho pulse sequences of the cervical spine, to include the craniocervical junction and cervicothoracic junction, and the thoracic spine, were obtained without intravenous contrast. COMPARISON:  CT cervical spine 05/22/2023 Lumbar spine MRI 03/11/2023 FINDINGS: MRI CERVICAL SPINE FINDINGS Alignment: Physiologic. Vertebrae: No fracture, evidence of discitis, or bone lesion. Cord: There is hyperintense T2-weighted signal within the spinal cord at the C3-4 level. Mild cord atrophy. Posterior Fossa, vertebral arteries, paraspinal tissues: Negative Disc levels: C1-2: Unremarkable. C2-3: Small central disc protrusion and moderate left facet arthrosis. There is no spinal canal stenosis. No neural foraminal stenosis. C3-4: Central disc protrusion and ossification of the posterior longitudinal ligament. Severe spinal canal stenosis. The spinal cord is compressed. Severe right and moderate left neural foraminal stenosis. C4-5: Intermediate sized disc bulge with ossification of the posterior longitudinal ligament. There is spinal cord compression. Severe spinal canal stenosis. Severe bilateral neural foraminal stenosis. C5-6: Right asymmetric disc osteophyte complex. Mild spinal canal stenosis. Severe  right neural foraminal stenosis. C6-7: Small central disc osteophyte complex. There is no spinal canal stenosis. No neural foraminal stenosis. C7-T1: Normal disc space and facet joints. There is no spinal canal stenosis. No neural foraminal stenosis. MRI THORACIC SPINE FINDINGS Alignment:  Physiologic. Vertebrae: Inflated edema at T11-12.  No acute fracture. Cord: Mild spinal cord volume loss. Hyperintense T2-weighted signal within the conus medullaris at the T11-12 level. Paraspinal and other soft tissues: Negative Disc levels: There is a large left subarticular disc protrusion superimposed on a diffuse disc bulge at T11-12 causing severe spinal canal stenosis with impingement of the spinal cord. There is also severe bilateral foraminal stenosis. The other thoracic disc levels are unremarkable. IMPRESSION: 1. 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 2. Large left subarticular disc protrusion superimposed on a diffuse disc bulge at T11-12 causing severe spinal canal stenosis with impingement of the spinal cord. There is also severe bilateral neural foraminal stenosis at this level. 3. Severe bilateral C4-5 and right C5-6 neural foraminal stenosis. Critical Value/emergent results were called by telephone at the time of interpretation on 05/22/2023 at 8:55 pm to provider ERIC [facility] , who verbally acknowledged these results. Electronically Signed   By: Deatra Robinson M.D.   On: 05/22/2023 20:57   MR CERVICAL SPINE WO CONTRAST  Result Date: 05/22/2023 CLINICAL DATA:  Ataxia EXAM: MRI CERVICAL AND THORACIC SPINE WITHOUT CONTRAST TECHNIQUE: Multiplanar and multiecho pulse sequences of the cervical spine, to include the craniocervical junction and cervicothoracic junction, and the thoracic spine, were obtained without intravenous contrast. COMPARISON:  CT cervical spine  05/22/2023 Lumbar spine MRI 03/11/2023 FINDINGS: MRI CERVICAL SPINE FINDINGS Alignment: Physiologic. Vertebrae: No fracture, evidence of discitis, or bone lesion. Cord: There is hyperintense T2-weighted signal within the spinal cord at the C3-4 level. Mild cord atrophy. Posterior Fossa, vertebral arteries, paraspinal tissues: Negative Disc levels: C1-2: Unremarkable. C2-3: Small central disc protrusion and moderate left facet arthrosis. There is no spinal canal stenosis. No neural foraminal stenosis. C3-4: Central disc protrusion and ossification of the posterior longitudinal ligament. Severe spinal canal stenosis. The spinal cord is compressed. Severe right and moderate left neural foraminal stenosis. C4-5: Intermediate sized disc bulge with ossification of the posterior longitudinal ligament. There is spinal cord compression. Severe spinal canal stenosis. Severe bilateral neural foraminal stenosis. C5-6: Right asymmetric disc osteophyte complex. Mild spinal canal stenosis. Severe right neural foraminal stenosis. C6-7: Small central disc osteophyte complex. There is no spinal canal stenosis. No neural foraminal stenosis. C7-T1: Normal disc space and facet joints. There is no spinal canal stenosis. No neural foraminal stenosis. MRI THORACIC SPINE FINDINGS Alignment:  Physiologic. Vertebrae: Inflated edema at T11-12.  No acute fracture. Cord: Mild spinal cord volume loss. Hyperintense T2-weighted signal within the conus medullaris at the T11-12 level. Paraspinal and other soft tissues: Negative Disc levels: There is a large left subarticular disc protrusion superimposed on a diffuse disc bulge at T11-12 causing severe spinal canal stenosis with impingement of the spinal cord. There is also severe bilateral foraminal stenosis. The other thoracic disc levels are unremarkable. IMPRESSION: 1. Severe spinal canal stenosis at C3-4 and C4-5 with spinal cord compression and hyperintense T2-weighted signal within the spinal  cord at the C3-4 level, consistent with compressive myelopathy. 2. Large left subarticular disc protrusion superimposed on a diffuse disc bulge at T11-12 causing severe spinal canal stenosis with impingement of the spinal cord. There is also severe bilateral neural foraminal stenosis at this level. 3. Severe bilateral C4-5 and right C5-6 neural foraminal stenosis. Critical Value/emergent results were called by telephone at the time of interpretation on 05/22/2023 at 8:55 pm to provider ERIC LINDZEN ,  who verbally acknowledged these results. Electronically Signed   By: Deatra Robinson M.D.   On: 05/22/2023 20:57   MR BRAIN WO CONTRAST  Result Date: 05/21/2023 CLINICAL DATA:  Stroke suspected EXAM: MRI HEAD WITHOUT CONTRAST TECHNIQUE: Multiplanar, multiecho pulse sequences of the brain and surrounding structures were obtained without intravenous contrast. COMPARISON:  CT head 1 day prior FINDINGS: Brain: There is no acute intracranial hemorrhage, extra-axial fluid collection, or acute infarct Background parenchymal volume is normal for age. The ventricles are normal in size. Patchy and confluent FLAIR signal abnormality in the supratentorial white matter likely reflects sequela of moderate chronic small-vessel ischemic change, with small remote infarcts in the bilateral corona radiata, and basal ganglia, left thalamus, and left cerebellar hemisphere. The pituitary and suprasellar region are normal. Vascular: Normal flow voids. Skull and upper cervical spine: Normal marrow signal. Sinuses/Orbits: The paranasal sinuses are clear. Bilateral lens implants are in place. The globes and orbits are otherwise unremarkable. Other: The mastoid air cells and middle ear cavities are clear. IMPRESSION: 1. No acute intracranial pathology. 2. 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 Electronically Signed   By: Lesia Hausen M.D.   On: 05/21/2023 15:41   ECHOCARDIOGRAM COMPLETE  Result  Date: 05/21/2023    ECHOCARDIOGRAM REPORT   Patient Name:   Katherine Cook Date of Exam: 05/21/2023 Medical Rec #:  [id number]   Height:       70.0 in Accession #:    [id number]  Weight:       269.4 lb Date of Birth:  1941/08/11   BSA:          2.369 m Patient Age:    82 years    BP:           109/49 mmHg Patient Gender: F           HR:           71 bpm. Exam Location:  Inpatient Procedure: 2D Echo, Cardiac Doppler and Color Doppler Indications:    syncope  History:        Patient has no prior history of Echocardiogram examinations.                 Chronic kidney disease; Risk Factors:Hypertension, Dyslipidemia                 and Diabetes.  Sonographer:    Delcie Roch RDCS Referring Phys: [id number] SUBRINA SUNDIL  Sonographer Comments: Technically difficult study due to poor echo windows. Image acquisition challenging due to patient body habitus. IMPRESSIONS  1. Left ventricular ejection fraction, by estimation, is 60 to 65%. The left ventricle has normal function. The left ventricle demonstrates regional wall motion abnormalities (suggestive of LBBB). There is mild concentric left ventricular hypertrophy. Left ventricular diastolic function could not be evaluated.  2. Right ventricular systolic function is normal. The right ventricular size is normal.  3. The mitral valve is degenerative. No evidence of mitral valve regurgitation. The mean mitral valve gradient is 2.0 mmHg with average heart rate of 68 bpm. Severe mitral annular calcification.  4. The aortic valve is tricuspid. There is mild calcification of the aortic valve. Aortic valve regurgitation is not visualized. Aortic valve sclerosis is present, with no evidence of aortic valve stenosis.  5. The inferior vena cava is normal in size with greater than 50% respiratory variability, suggesting right atrial pressure of 3 mmHg. Comparison(s): No prior Echocardiogram. FINDINGS  Left Ventricle: Left ventricular ejection fraction, by  estimation, is 60 to 65%. The  left ventricle has normal function. The left ventricle demonstrates regional wall motion abnormalities. The left ventricular internal cavity size was normal in size. There is mild concentric left ventricular hypertrophy. Abnormal (paradoxical) septal motion, consistent with left bundle branch block. Left ventricular diastolic function could not be evaluated due to mitral annular calcification (moderate or greater). Left ventricular diastolic function could not be evaluated.  LV Wall Scoring: The anterior septum, mid inferoseptal segment, and basal inferoseptal segment are hypokinetic. Right Ventricle: The right ventricular size is normal. No increase in right ventricular wall thickness. Right ventricular systolic function is normal. Left Atrium: Left atrial size was normal in size. Right Atrium: Right atrial size was normal in size. Pericardium: There is no evidence of pericardial effusion. Mitral Valve: The mitral valve is degenerative in appearance. Severe mitral annular calcification. No evidence of mitral valve regurgitation. The mean mitral valve gradient is 2.0 mmHg with average heart rate of 68 bpm. Tricuspid Valve: The tricuspid valve is normal in structure. Tricuspid valve regurgitation is not demonstrated. No evidence of tricuspid stenosis. Aortic Valve: The aortic valve is tricuspid. There is mild calcification of the aortic valve. Aortic valve regurgitation is not visualized. Aortic valve sclerosis is present, with no evidence of aortic valve stenosis. Pulmonic Valve: The pulmonic valve was normal in structure. Pulmonic valve regurgitation is not visualized. Aorta: The aortic root and ascending aorta are structurally normal, with no evidence of dilitation. Venous: The inferior vena cava is normal in size with greater than 50% respiratory variability, suggesting right atrial pressure of 3 mmHg. IAS/Shunts: No atrial level shunt detected by color flow Doppler.  LEFT VENTRICLE PLAX 2D LVIDd:         4.80 cm    Diastology LVIDs:         3.20 cm   LV e' medial:    4.79 cm/s LV PW:         1.10 cm   LV E/e' medial:  20.2 LV IVS:        1.10 cm   LV e' lateral:   6.96 cm/s LVOT diam:     2.30 cm   LV E/e' lateral: 13.9 LV SV:         91 LV SV Index:   38 LVOT Area:     4.15 cm  RIGHT VENTRICLE             IVC RV Basal diam:  2.90 cm     IVC diam: 1.80 cm RV S prime:     12.20 cm/s LEFT ATRIUM             Index        RIGHT ATRIUM           Index LA diam:        4.00 cm 1.69 cm/m   RA Area:     14.90 cm LA Vol (A2C):   49.6 ml 20.94 ml/m  RA Volume:   38.50 ml  16.25 ml/m LA Vol (A4C):   48.9 ml 20.64 ml/m LA Biplane Vol: 50.7 ml 21.40 ml/m  AORTIC VALVE LVOT Vmax:   100.00 cm/s LVOT Vmean:  67.700 cm/s LVOT VTI:    0.218 m  AORTA Ao Root diam: 3.50 cm Ao Asc diam:  3.50 cm MITRAL VALVE MV Area (PHT): 3.12 cm     SHUNTS MV Mean grad:  2.0 mmHg     Systemic VTI:  0.22 m MV Decel Time: 243 msec  Systemic Diam: 2.30 cm MV E velocity: 96.70 cm/s MV A velocity: 115.00 cm/s MV E/A ratio:  0.84 Riley Lam MD Electronically signed by Riley Lam MD Signature Date/Time: 05/21/2023/12:03:57 PM    Final      LOS: 2 days   Lanae Boast, MD [facility]  05/23/2023, 8:11 AM

## 2023-05-23 NOTE — Progress Notes (Addendum)
Brief Neurology Update  MRI c + t spine overnight resulted:  1. 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 2. Large left subarticular disc protrusion superimposed on a diffuse disc bulge at T11-12 causing severe spinal canal stenosis with impingement of the spinal cord. There is also severe bilateral neural foraminal stenosis at this level. 3. Severe bilateral C4-5 and right C5-6 neural foraminal stenosis.   Results explain presenting sx of LLE weakness.  Messaged Dr. Jonathon Bellows hospitalist and recommended asap neurosurgery consult. Neurosurgery plans to see her on rounds this AM. They are tentatively planning surgical intervention on t spine while inpatient once her kidneys improve and treatment of cervical stenosis as outpatient.  Further mgmt deferred to neurosurgery. Neurology will be available prn for questions going forward.  Bing Neighbors, MD [facility] [phone]  If 7pm- 7am, please page neurology on call as listed in [facility].

## 2023-05-23 NOTE — TOC Progression Note (Addendum)
Transition of Care [facility]) - Progression Note    Patient Details  Name: Katherine Cook MRN: [id number] Date of Birth: [birth year]  Transition of Care [facility]) CM/SW Contact   A [place], [place] Phone Number: 05/23/2023, 3:32 PM  Clinical Narrative:     CSW met with pt at bedside and provided bed offers. She stated she was interested in [facility] as she and her family are familiar with that facility. CSW to reach out to [employer] and request to look at pt to provide possible bed offer.   TOC will continue to follow.   Expected Discharge Plan: Skilled Nursing Facility Barriers to Discharge: Continued Medical Work up  Expected Discharge Plan and Services In-house Referral: Clinical Social Work     Living arrangements for the past 2 months: Single Family Home                                       Social Determinants of Health (SDOH) Interventions SDOH Screenings   Food Insecurity: No Food Insecurity (05/21/2023)  Housing: Low Risk  (05/21/2023)  Transportation Needs: No Transportation Needs (05/21/2023)  Utilities: Not At Risk (05/21/2023)  Tobacco Use: Low Risk  (05/21/2023)    Readmission Risk Interventions     No data to display

## 2023-05-23 NOTE — Consult Note (Signed)
Neurosurgery Consultation  Reason for Consult: LLE weakness  Referring Physician: Dayna Barker   CC: LLE weakness   HPI: This is a 82 y.o. female that presents to the [facility] ED on 05/20/23 after a fall at home. She was not found until 12 hours later. After laying on the floor she noticed LLE weakness and numbness. No parasthesias, no recent change in bowel or bladder function. Brain MRI was done showing no acute pathology / explanation for the new onset LLE weakness. She takes ASA81 mg and has been on SQH while at the hospital, last dose of ASA was today at 10:34 AM and SQH at 5:55AM. In the ED she was found to have rhabdomylosis / AKI.    ROS: A 14 point ROS was performed and is negative except as noted in the HPI.   PMHx:  Past Medical History:  Diagnosis Date   Diabetes mellitus without complication (HCC)    FamHx: History reviewed. No pertinent family history. SocHx:  reports that she has never smoked. She has never used smokeless tobacco. She reports that she does not drink alcohol and does not use drugs.  Exam: Vital signs in last 24 hours: Temp:  [98.5 F (36.9 C)-99.5 F (37.5 C)] 98.7 F (37.1 C) (08/12 0758) Pulse Rate:  [64-73] 68 (08/12 0758) Resp:  [14-18] 18 (08/12 0758) BP: (108-137)/(49-64) 137/63 (08/12 0758) SpO2:  [92 %-96 %] 92 % (08/12 0758) General: Awake, alert, cooperative, lying in bed in NAD Head: Normocephalic and atruamatic HEENT: Neck supple Pulmonary: breathing room air comfortably, no evidence of increased work of breathing Cardiac: RRR Abdomen: S NT ND Extremities: Warm and well perfused x4 Neuro: AOx3, PERRL, EOMI, FS Strength 5/5 in the RLE, strength 4+/5 in the LLE,  strength 5/5 in the bilateral upper extremities, SILTx4, Hoffmann's -, no clonus    Assessment and Plan: 81 y.o. female with new onset LLE weakness and numbness after a fall at home. Cervical and thoracic MRIs were personally reviewed. Cervical MRI shows severe spinal canal stenosis at C3-4  and C4-5 with cord signal change at C3-4. Thoracic MRI shows  large left sided disc protrusion at T11-12 causing severe spinal canal and bilateral foraminal stenosis with mass effect on the cord.  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 However, her thoracic disc herniation appears to be acute, likely secondary to the fall. Recommend T11-12 decompressive laminectomy and discectomy once her renal function improves. Will have to hold anticoagulation prior to surgical management.  -please call with any concerns or questions  Iran Sizer, PA-C 05/23/23 10:40 AM [facility]

## 2023-05-23 NOTE — Plan of Care (Signed)

## 2023-05-23 NOTE — Consult Note (Signed)
Cardiology Consultation   Patient ID: Katherine Cook MRN: [id number]; DOB: August 30, 1941  Admit date: 05/20/2023 Date of Consult: 05/23/2023  PCP:  Earley Brooke., MD   [facility] Providers Cardiologist:  New to Dr Duke Salvia    Patient Profile:   Katherine Cook is a 82 y.o. female with a hx of type 2 DM with neuropathy, hyperlipidemia, hypertension, CKD stage IIIa, who is being seen 05/23/2023 for the evaluation of preop evaluation for T11-12 decompressive laminectomy and discectomy at the request of Dr Vick Frees.  History of Present Illness:   Katherine Cook was above past medical history presented to the ER 05/21/2023 after mechanical fall at home.  Patient reports that she was trying to get out of bed, lost her balance and fell, did not loss consciousness.  She was weak and not able to get herself up.  She laid on the floor for 12 hours until 7 PM on 05/20/2023.  She was found to have AKI and rhabdomyolysis, admitted to hospitalist service.  Due to complaints of significant new onset of left lower extremity weakness, she was seen by neurology. MRI of brain showed no acute finding, small remote infarcts and background chronic small vessel ischemic change.. 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 Thoracic MRI showed large left sided disc protrusion at T11-12 causing severe spinal canal and bilateral foraminal stenosis with mass effect on the cord.  She was recommended T11-12 decompressive laminectomy and discectomy by neurosurgery.  Pre-operative evaluation is requested now for risk evaluation.   Patient states she had no previous cardiac conditions. She denied hx of MI, CAD, CVA, CHF, asthma, COPD. She is quite functioning prior to the fall. She states she is independently with ADLs, able to cook for herself, do laundry, and clean the house. She is able to cut her bushes. She is able to walk a block if needed, not able to climb stairs.  She is able to drive to her appointment and do grocery shopping. She denied ever having any chest pain, dizziness, syncope. She states she may get SOB if she is doing heavy work. She had questions about her back surgery and is wondering if alternative can be offered. She denied tobacco use.   Initial admission diagnostic from 05/20/2023 revealed elevated creatinine 1.98, BUN 34, alk phos 34, albumin 3.4, AST 71, ALT 23, total bili 1.4, EGFR 25.  CK elevated 2412.  High sensor troponin 31 >30 >37 >33.  CBC with leukocytosis 12,000, hemoglobin 10.2.  Subsequent labs revealed normal TSH, A1c 6.4%, negative hepatitis panel.  Urinalysis positive for hemoglobin, leukocyte, nitrates, protein, bacteria.  Serum creatinine peaked at 2.12 that is down trended to 1.85 today.  CK has down trended to 667 today.  Hemoglobin down trended to 8.8.  Echocardiogram from 05/21/2023 revealed LVEF 60 to 65%, mild concentric LVH, normal RV, severe MAC, aortic sclerosis. Abnormal  (paradoxical) septal motion, consistent with left bundle branch block.    Past Medical History:  Diagnosis Date   Diabetes mellitus without complication (HCC)     Past Surgical History:  Procedure Laterality Date   ABDOMINAL HYSTERECTOMY     BACK SURGERY     NECK SURGERY     REPLACEMENT TOTAL KNEE BILATERAL       Home Medications:  Prior to Admission medications   Medication Sig Start Date End Date Taking? Authorizing Provider  amLODipine (NORVASC) 10 MG tablet Take 10 mg by mouth daily.  Yes [provider]  ascorbic acid (VITAMIN C) 1000 MG tablet Take 1,000 mg by mouth daily. 07/08/19  Yes [provider]  aspirin EC 81 MG tablet Take 81 mg by mouth daily. 10/15/15  Yes [provider]  celecoxib (CELEBREX) 200 MG capsule Take 200 mg by mouth daily.   Yes [provider]  donepezil (ARICEPT) 10 MG tablet Take 10 mg by mouth daily.   Yes [provider]  furosemide (LASIX) 40 MG tablet Take 40 mg  by mouth daily.   Yes [provider]  gabapentin (NEURONTIN) 100 MG capsule Take 100 mg by mouth 2 (two) times daily.   Yes [provider]  LEVEMIR FLEXPEN 100 UNIT/ML FlexPen Inject 10 Units into the skin 2 (two) times daily. 10 units in the am, and 10 units in the pm.   Yes [provider]  losartan (COZAAR) 50 MG tablet Take 25 mg by mouth daily.   Yes [provider]  MOUNJARO 10 MG/0.5ML Pen Inject into the skin.   Yes [provider]  omeprazole (PRILOSEC) 20 MG capsule Take 20 mg by mouth daily.   Yes [provider]  pregabalin (LYRICA) 100 MG capsule Take 100 mg by mouth 2 (two) times daily. 05/12/23  Yes [provider]  rosuvastatin (CRESTOR) 40 MG tablet Take 40 mg by mouth daily.   Yes [provider]  SUMAtriptan (IMITREX) 100 MG tablet Take 1 tablet (100 mg total) by mouth every 6 (six) hours as needed for migraine. May repeat in 2 hours if headache persists or recurs. 07/02/17  Yes Charlynne Pander, MD    Inpatient Medications: Scheduled Meds:  ascorbic acid  1,000 mg Oral Daily   aspirin EC  81 mg Oral Daily   donepezil  10 mg Oral Daily   gabapentin  100 mg Oral BID   heparin  5,000 Units Subcutaneous Q8H   insulin aspart  0-6 Units Subcutaneous TID WC   pantoprazole  40 mg Oral Daily   sodium chloride flush  3 mL Intravenous Q12H   Continuous Infusions:  sodium chloride     cefTRIAXone (ROCEPHIN)  IV 1 g (05/23/23 1047)   lactated ringers 100 mL/hr at 05/23/23 1043   PRN Meds: sodium chloride, acetaminophen **OR** acetaminophen, hydrALAZINE, HYDROcodone-acetaminophen, ondansetron **OR** ondansetron (ZOFRAN) IV, senna-docusate, sodium chloride flush  Allergies:   No Known Allergies  Social History:   Social History   Socioeconomic History   Marital status: Single    Spouse name: Not on file   Number of children: Not on file   Years of education: Not on file   Highest education level: Not  on file  Occupational History   Not on file  Tobacco Use   Smoking status: Never   Smokeless tobacco: Never  Vaping Use   Vaping status: Never Used  Substance and Sexual Activity   Alcohol use: No   Drug use: No   Sexual activity: Not on file  Other Topics Concern   Not on file  Social History Narrative   Not on file   Social Determinants of Health   Financial Resource Strain: Not on file  Food Insecurity: No Food Insecurity (05/21/2023)   Hunger Vital Sign    Worried About Running Out of Food in the Last Year: Never true    Ran Out of Food in the Last Year: Never true  Transportation Needs: No Transportation Needs (05/21/2023)   PRAPARE - Transportation    Lack  of Transportation (Medical): No    Lack of Transportation (Non-Medical): No  Physical Activity: Not on file  Stress: Not on file  Social Connections: Not on file  Intimate Partner Violence: Not At Risk (05/21/2023)   Humiliation, Afraid, Rape, and Kick questionnaire    Fear of Current or Ex-Partner: No    Emotionally Abused: No    Physically Abused: No    Sexually Abused: No    Family History:   No family hx of heart disease   ROS:  Constitutional: Denied fever, chills, malaise, night sweats Eyes: Denied vision change or loss Ears/Nose/Mouth/Throat: Denied ear ache, sore throat, coughing, sinus pain Cardiovascular: see HPI  Respiratory: see HPI  Gastrointestinal: Denied nausea, vomiting, abdominal pain, diarrhea Genital/Urinary: Denied dysuria, hematuria, urinary frequency/urgency Musculoskeletal: see HPI  Skin: Denied rash, wound Neuro: Denied headache, dizziness, syncope Psych: Denied history of depression/anxiety  Endocrine: history of diabetes    Physical Exam/Data:   Vitals:   05/22/23 1629 05/22/23 2026 05/23/23 0006 05/23/23 0758  BP: (!) 122/59 131/64 (!) 108/49 137/63  Pulse: 64 73 69 68  Resp: 14 18 17 18   Temp: 98.8 F (37.1 C) 99.5 F (37.5 C) 98.5 F (36.9 C) 98.7 F (37.1 C)   TempSrc: Oral Oral Oral Oral  SpO2: 93% 94% 95% 92%  Weight:      Height:        Intake/Output Summary (Last 24 hours) at 05/23/2023 1451 Last data filed at 05/23/2023 1216 Gross per 24 hour  Intake 572 ml  Output 2500 ml  Net -1928 ml      05/22/2023    5:41 AM 05/21/2023    4:16 AM 05/20/2023    9:17 PM  Last 3 Weights  Weight (lbs) 273 lb 9.5 oz 269 lb 6.4 oz 279 lb 1.6 oz  Weight (kg) 124.1 kg 122.2 kg 126.6 kg     Body mass index is 39.26 kg/m.  Vitals:  Vitals:   05/23/23 0006 05/23/23 0758  BP: (!) 108/49 137/63  Pulse: 69 68  Resp: 17 18  Temp: 98.5 F (36.9 C) 98.7 F (37.1 C)  SpO2: 95% 92%   General Appearance: In no apparent distress, laying in bed, well nourished  HEENT: Normocephalic. Right above eye laceration noted  Neck: Supple, trachea midline, no JVDs Cardiovascular: Regular rate and rhythm, normal S1-S2,  no murmur Respiratory: Resting breathing unlabored, lungs sounds clear to auscultation bilaterally, no use of accessory muscles. On room air.  No wheezes, rales or rhonchi.   Gastrointestinal: Bowel sounds positive, abdomen soft, non-tender Extremities: Able to move all extremities in bed without difficulty, trace edema of BLE  Musculoskeletal: Normal muscle bulk and tone, LE muscle strength not examined  Skin: Intact, warm, dry. No rashes or petechiae noted in exposed areas.  Neurologic: Alert, oriented to person, place and time. Fluent speech, no cognitive deficit,  no gross focal neuro deficit Psychiatric: Normal affect. Mood is appropriate.     EKG:  The EKG was personally reviewed and demonstrates:    EKG from 8/9 showed sinus rhythm 84 bpm, LBBB, no old EKG available to compare   Telemetry:  Telemetry was personally reviewed and demonstrates:    Sinus rhythm, first degree AVB   Relevant CV Studies:   Echo 05/21/23:   1. Left ventricular ejection fraction, by estimation, is 60 to 65%. The  left ventricle has normal function. The  left ventricle demonstrates  regional wall motion abnormalities (suggestive of LBBB). There is mild  concentric left  ventricular hypertrophy.  Left ventricular diastolic function could not be evaluated.   2. Right ventricular systolic function is normal. The right ventricular  size is normal.   3. The mitral valve is degenerative. No evidence of mitral valve  regurgitation. The mean mitral valve gradient is 2.0 mmHg with average  heart rate of 68 bpm. Severe mitral annular calcification.   4. The aortic valve is tricuspid. There is mild calcification of the  aortic valve. Aortic valve regurgitation is not visualized. Aortic valve  sclerosis is present, with no evidence of aortic valve stenosis.   5. The inferior vena cava is normal in size with greater than 50%  respiratory variability, suggesting right atrial pressure of 3 mmHg.   Comparison(s): No prior Echocardiogram.   Laboratory Data:  High Sensitivity Troponin:   Recent Labs  Lab 05/20/23 2137 05/20/23 2340 05/21/23 0713 05/21/23 1008  TROPONINIHS 31* 30* 37* 33*     Chemistry Recent Labs  Lab 05/21/23 0713 05/22/23 0345 05/23/23 0140  NA 143 141 141  K 4.6 4.0 4.4  CL 107 109 108  CO2 25 24 24   GLUCOSE 128* 127* 124*  BUN 28* 26* 21  CREATININE 2.12* 1.87* 1.85*  CALCIUM 9.0 8.9 8.8*  GFRNONAA 23* 27* 27*  ANIONGAP 11 8 9     Recent Labs  Lab 05/21/23 0713 05/22/23 0345 05/23/23 0140  PROT 6.3* 6.2* 5.9*  ALBUMIN 2.9* 2.8* 2.5*  AST 71* 62* 44*  ALT 23 23 22   ALKPHOS 30* 32* 30*  BILITOT 1.2 1.2 0.7   Lipids No results for input(s): "CHOL", "TRIG", "HDL", "LABVLDL", "LDLCALC", "CHOLHDL" in the last 168 hours.  Hematology Recent Labs  Lab 05/21/23 0713 05/22/23 0345 05/23/23 0140  WBC 10.1 8.9 8.6  RBC 3.12* 3.36* 2.99*  HGB 9.2* 9.9* 8.8*  HCT 27.9* 30.0* 26.8*  MCV 89.4 89.3 89.6  MCH 29.5 29.5 29.4  MCHC 33.0 33.0 32.8  RDW 14.2 14.1 14.0  PLT 158 168 166   Thyroid  Recent Labs  Lab  05/21/23 0713  TSH 2.114  FREET4 1.03    BNPNo results for input(s): "BNP", "PROBNP" in the last 168 hours.  DDimer No results for input(s): "DDIMER" in the last 168 hours.   Radiology/Studies:  MR THORACIC SPINE WO CONTRAST  Result Date: 05/22/2023 CLINICAL DATA:  Ataxia EXAM: MRI CERVICAL AND THORACIC SPINE WITHOUT CONTRAST TECHNIQUE: Multiplanar and multiecho pulse sequences of the cervical spine, to include the craniocervical junction and cervicothoracic junction, and the thoracic spine, were obtained without intravenous contrast. COMPARISON:  CT cervical spine 05/22/2023 Lumbar spine MRI 03/11/2023 FINDINGS: MRI CERVICAL SPINE FINDINGS Alignment: Physiologic. Vertebrae: No fracture, evidence of discitis, or bone lesion. Cord: There is hyperintense T2-weighted signal within the spinal cord at the C3-4 level. Mild cord atrophy. Posterior Fossa, vertebral arteries, paraspinal tissues: Negative Disc levels: C1-2: Unremarkable. C2-3: Small central disc protrusion and moderate left facet arthrosis. There is no spinal canal stenosis. No neural foraminal stenosis. C3-4: Central disc protrusion and ossification of the posterior longitudinal ligament. Severe spinal canal stenosis. The spinal cord is compressed. Severe right and moderate left neural foraminal stenosis. C4-5: Intermediate sized disc bulge with ossification of the posterior longitudinal ligament. There is spinal cord compression. Severe spinal canal stenosis. Severe bilateral neural foraminal stenosis. C5-6: Right asymmetric disc osteophyte complex. Mild spinal canal stenosis. Severe right neural foraminal stenosis. C6-7: Small central disc osteophyte complex. There is no spinal canal stenosis. No neural foraminal stenosis. C7-T1: Normal disc space and facet  joints. There is no spinal canal stenosis. No neural foraminal stenosis. MRI THORACIC SPINE FINDINGS Alignment:  Physiologic. Vertebrae: Inflated edema at T11-12.  No acute fracture. Cord:  Mild spinal cord volume loss. Hyperintense T2-weighted signal within the conus medullaris at the T11-12 level. Paraspinal and other soft tissues: Negative Disc levels: There is a large left subarticular disc protrusion superimposed on a diffuse disc bulge at T11-12 causing severe spinal canal stenosis with impingement of the spinal cord. There is also severe bilateral foraminal stenosis. The other thoracic disc levels are unremarkable. IMPRESSION: 1. 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 2. Large left subarticular disc protrusion superimposed on a diffuse disc bulge at T11-12 causing severe spinal canal stenosis with impingement of the spinal cord. There is also severe bilateral neural foraminal stenosis at this level. 3. Severe bilateral C4-5 and right C5-6 neural foraminal stenosis. Critical Value/emergent results were called by telephone at the time of interpretation on 05/22/2023 at 8:55 pm to provider ERIC [facility] , who verbally acknowledged these results. Electronically Signed   By: Deatra Robinson M.D.   On: 05/22/2023 20:57   MR CERVICAL SPINE WO CONTRAST  Result Date: 05/22/2023 CLINICAL DATA:  Ataxia EXAM: MRI CERVICAL AND THORACIC SPINE WITHOUT CONTRAST TECHNIQUE: Multiplanar and multiecho pulse sequences of the cervical spine, to include the craniocervical junction and cervicothoracic junction, and the thoracic spine, were obtained without intravenous contrast. COMPARISON:  CT cervical spine 05/22/2023 Lumbar spine MRI 03/11/2023 FINDINGS: MRI CERVICAL SPINE FINDINGS Alignment: Physiologic. Vertebrae: No fracture, evidence of discitis, or bone lesion. Cord: There is hyperintense T2-weighted signal within the spinal cord at the C3-4 level. Mild cord atrophy. Posterior Fossa, vertebral arteries, paraspinal tissues: Negative Disc levels: C1-2:  Unremarkable. C2-3: Small central disc protrusion and moderate left facet arthrosis. There is no spinal canal stenosis. No neural foraminal stenosis. C3-4: Central disc protrusion and ossification of the posterior longitudinal ligament. Severe spinal canal stenosis. The spinal cord is compressed. Severe right and moderate left neural foraminal stenosis. C4-5: Intermediate sized disc bulge with ossification of the posterior longitudinal ligament. There is spinal cord compression. Severe spinal canal stenosis. Severe bilateral neural foraminal stenosis. C5-6: Right asymmetric disc osteophyte complex. Mild spinal canal stenosis. Severe right neural foraminal stenosis. C6-7: Small central disc osteophyte complex. There is no spinal canal stenosis. No neural foraminal stenosis. C7-T1: Normal disc space and facet joints. There is no spinal canal stenosis. No neural foraminal stenosis. MRI THORACIC SPINE FINDINGS Alignment:  Physiologic. Vertebrae: Inflated edema at T11-12.  No acute fracture. Cord: Mild spinal cord volume loss. Hyperintense T2-weighted signal within the conus medullaris at the T11-12 level. Paraspinal and other soft tissues: Negative Disc levels: There is a large left subarticular disc protrusion superimposed on a diffuse disc bulge at T11-12 causing severe spinal canal stenosis with impingement of the spinal cord. There is also severe bilateral foraminal stenosis. The other thoracic disc levels are unremarkable. IMPRESSION: 1. 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 2. Large left subarticular disc protrusion superimposed on a diffuse disc bulge at T11-12 causing severe spinal canal stenosis with impingement of the spinal cord. There is also severe bilateral neural foraminal stenosis at this level. 3. Severe bilateral C4-5 and right C5-6 neural foraminal  stenosis. Critical Value/emergent results  were called by telephone at the time of interpretation on 05/22/2023 at 8:55 pm to provider ERIC [facility] , who verbally acknowledged these results. Electronically Signed   By: Deatra Robinson M.D.   On: 05/22/2023 20:57   MR BRAIN WO CONTRAST  Result Date: 05/21/2023 CLINICAL DATA:  Stroke suspected EXAM: MRI HEAD WITHOUT CONTRAST TECHNIQUE: Multiplanar, multiecho pulse sequences of the brain and surrounding structures were obtained without intravenous contrast. COMPARISON:  CT head 1 day prior FINDINGS: Brain: There is no acute intracranial hemorrhage, extra-axial fluid collection, or acute infarct Background parenchymal volume is normal for age. The ventricles are normal in size. Patchy and confluent FLAIR signal abnormality in the supratentorial white matter likely reflects sequela of moderate chronic small-vessel ischemic change, with small remote infarcts in the bilateral corona radiata, and basal ganglia, left thalamus, and left cerebellar hemisphere. The pituitary and suprasellar region are normal. Vascular: Normal flow voids. Skull and upper cervical spine: Normal marrow signal. Sinuses/Orbits: The paranasal sinuses are clear. Bilateral lens implants are in place. The globes and orbits are otherwise unremarkable. Other: The mastoid air cells and middle ear cavities are clear. IMPRESSION: 1. No acute intracranial pathology. 2. 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 Electronically Signed   By: Lesia Hausen M.D.   On: 05/21/2023 15:41   ECHOCARDIOGRAM COMPLETE  Result Date: 05/21/2023    ECHOCARDIOGRAM REPORT   Patient Name:   ZHAVIA MITCHELL Date of Exam: 05/21/2023 Medical Rec #:  [id number]   Height:       70.0 in Accession #:    [id number]  Weight:       269.4 lb Date of Birth:  [birth year]   BSA:          2.369 m Patient Age:    82 years    BP:           109/49 mmHg Patient Gender: F           HR:           71 bpm. Exam  Location:  Inpatient Procedure: 2D Echo, Cardiac Doppler and Color Doppler Indications:    syncope  History:        Patient has no prior history of Echocardiogram examinations.                 Chronic kidney disease; Risk Factors:Hypertension, Dyslipidemia                 and Diabetes.  Sonographer:    Delcie Roch RDCS Referring Phys: [id number] SUBRINA SUNDIL  Sonographer Comments: Technically difficult study due to poor echo windows. Image acquisition challenging due to patient body habitus. IMPRESSIONS  1. Left ventricular ejection fraction, by estimation, is 60 to 65%. The left ventricle has normal function. The left ventricle demonstrates regional wall motion abnormalities (suggestive of LBBB). There is mild concentric left ventricular hypertrophy. Left ventricular diastolic function could not be evaluated.  2. Right ventricular systolic function is normal. The right ventricular size is normal.  3. The mitral valve is degenerative. No evidence of mitral valve regurgitation. The mean mitral valve gradient is 2.0 mmHg with average heart rate of 68 bpm. Severe mitral annular calcification.  4. The aortic valve is tricuspid. There is mild calcification of the aortic valve. Aortic valve regurgitation is not visualized. Aortic valve sclerosis is present, with no evidence of aortic valve stenosis.  5. The inferior vena cava is normal in size with greater than 50% respiratory variability, suggesting  right atrial pressure of 3 mmHg. Comparison(s): No prior Echocardiogram. FINDINGS  Left Ventricle: Left ventricular ejection fraction, by estimation, is 60 to 65%. The left ventricle has normal function. The left ventricle demonstrates regional wall motion abnormalities. The left ventricular internal cavity size was normal in size. There is mild concentric left ventricular hypertrophy. Abnormal (paradoxical) septal motion, consistent with left bundle branch block. Left ventricular diastolic function could not be  evaluated due to mitral annular calcification (moderate or greater). Left ventricular diastolic function could not be evaluated.  LV Wall Scoring: The anterior septum, mid inferoseptal segment, and basal inferoseptal segment are hypokinetic. Right Ventricle: The right ventricular size is normal. No increase in right ventricular wall thickness. Right ventricular systolic function is normal. Left Atrium: Left atrial size was normal in size. Right Atrium: Right atrial size was normal in size. Pericardium: There is no evidence of pericardial effusion. Mitral Valve: The mitral valve is degenerative in appearance. Severe mitral annular calcification. No evidence of mitral valve regurgitation. The mean mitral valve gradient is 2.0 mmHg with average heart rate of 68 bpm. Tricuspid Valve: The tricuspid valve is normal in structure. Tricuspid valve regurgitation is not demonstrated. No evidence of tricuspid stenosis. Aortic Valve: The aortic valve is tricuspid. There is mild calcification of the aortic valve. Aortic valve regurgitation is not visualized. Aortic valve sclerosis is present, with no evidence of aortic valve stenosis. Pulmonic Valve: The pulmonic valve was normal in structure. Pulmonic valve regurgitation is not visualized. Aorta: The aortic root and ascending aorta are structurally normal, with no evidence of dilitation. Venous: The inferior vena cava is normal in size with greater than 50% respiratory variability, suggesting right atrial pressure of 3 mmHg. IAS/Shunts: No atrial level shunt detected by color flow Doppler.  LEFT VENTRICLE PLAX 2D LVIDd:         4.80 cm   Diastology LVIDs:         3.20 cm   LV e' medial:    4.79 cm/s LV PW:         1.10 cm   LV E/e' medial:  20.2 LV IVS:        1.10 cm   LV e' lateral:   6.96 cm/s LVOT diam:     2.30 cm   LV E/e' lateral: 13.9 LV SV:         91 LV SV Index:   38 LVOT Area:     4.15 cm  RIGHT VENTRICLE             IVC RV Basal diam:  2.90 cm     IVC diam: 1.80 cm  RV S prime:     12.20 cm/s LEFT ATRIUM             Index        RIGHT ATRIUM           Index LA diam:        4.00 cm 1.69 cm/m   RA Area:     14.90 cm LA Vol (A2C):   49.6 ml 20.94 ml/m  RA Volume:   38.50 ml  16.25 ml/m LA Vol (A4C):   48.9 ml 20.64 ml/m LA Biplane Vol: 50.7 ml 21.40 ml/m  AORTIC VALVE LVOT Vmax:   100.00 cm/s LVOT Vmean:  67.700 cm/s LVOT VTI:    0.218 m  AORTA Ao Root diam: 3.50 cm Ao Asc diam:  3.50 cm MITRAL VALVE MV Area (PHT): 3.12 cm     SHUNTS MV  Mean grad:  2.0 mmHg     Systemic VTI:  0.22 m MV Decel Time: 243 msec     Systemic Diam: 2.30 cm MV E velocity: 96.70 cm/s MV A velocity: 115.00 cm/s MV E/A ratio:  0.84 Riley Lam MD Electronically signed by Riley Lam MD Signature Date/Time: 05/21/2023/12:03:57 PM    Final    US THYROID  Result Date: 05/21/2023 CLINICAL DATA:  Heterogeneous enlarged thyroid seen on CT EXAM: THYROID ULTRASOUND TECHNIQUE: Ultrasound examination of the thyroid gland and adjacent soft tissues was performed. COMPARISON:  CT cervical spine 05/20/2023 FINDINGS: Parenchymal Echotexture: Moderate heterogeneous Isthmus: 2.2 cm Right lobe: 7.6 x 3.8 x 3.5 cm Left lobe: 6.3 x 3.2 x 2.4 cm _________________________________________________________ Estimated total number of nodules >/= 1 cm: 0 Number of spongiform nodules >/=  2 cm not described below (TR1): 0 Number of mixed cystic and solid nodules >/= 1.5 cm not described below (TR2): 0 _________________________________________________________ No discrete nodules are seen within the thyroid gland. IMPRESSION: Enlarged heterogeneous thyroid without discrete nodule. The above is in keeping with the ACR TI-RADS recommendations - J Am Coll Radiol 2017;14:587-595. Electronically Signed   By: Acquanetta Belling M.D.   On: 05/21/2023 07:16   DG Hip Unilat W or Wo Pelvis 2-3 Views Right  Result Date: 05/21/2023 CLINICAL DATA:  Fall, possible hip fracture on CT EXAM: DG HIP (WITH OR WITHOUT PELVIS) 2-3V  RIGHT COMPARISON:  CT abdomen/pelvis dated 05/20/2023 FINDINGS: No fracture or dislocation is seen. Specifically, the right proximal femur is intact. Bilateral joint spaces are preserved. Visualized bony pelvis is intact. Mild degenerative changes of the lower lumbar spine. IMPRESSION: No fracture or dislocation is seen. Specifically, the right proximal femur is intact. Electronically Signed   By: Charline Bills M.D.   On: 05/21/2023 01:37   DG Pelvis Portable  Result Date: 05/20/2023 CLINICAL DATA:  fall EXAM: PORTABLE PELVIS 1-2 VIEWS COMPARISON:  None Available. FINDINGS: Limited evaluation due to overlapping osseous structures and overlying soft tissues. There is no evidence of pelvic fracture or diastasis. No hip dislocation bilaterally. Grossly unremarkable proximal femurs with no definite acute displaced fracture. Degenerative changes of bilateral sacroiliac joints. No pelvic bone lesions are seen. IMPRESSION: Negative for definite acute traumatic injury. Limited evaluation due to overlapping osseous structures and overlying soft tissues. Electronically Signed   By: Tish Frederickson M.D.   On: 05/20/2023 23:32   DG Chest Portable 1 View  Result Date: 05/20/2023 CLINICAL DATA:  fall EXAM: PORTABLE CHEST 1 VIEW COMPARISON:  Chest x-ray 07/05/2019 FINDINGS: The heart and mediastinal contours are within normal limits. Aortic calcification. No focal consolidation. No pulmonary edema. No pleural effusion. No pneumothorax. No acute osseous abnormality. IMPRESSION: 1. No active disease. 2.  Aortic Atherosclerosis (ICD10-I70.0). Electronically Signed   By: Tish Frederickson M.D.   On: 05/20/2023 23:30   CT CHEST ABDOMEN PELVIS WO CONTRAST  Result Date: 05/20/2023 CLINICAL DATA:  Polytrauma, blunt EXAM: CT CHEST, ABDOMEN AND PELVIS WITHOUT CONTRAST TECHNIQUE: Multidetector CT imaging of the chest, abdomen and pelvis was performed following the standard protocol without IV contrast. RADIATION DOSE REDUCTION:  This exam was performed according to the departmental dose-optimization program which includes automated exposure control, adjustment of the mA and/or kV according to patient size and/or use of iterative reconstruction technique. COMPARISON:  MRI right hip 02/14/2023, x-ray pelvis 05/20/2023 FINDINGS: CHEST: Cardiovascular: The thoracic aorta is normal in caliber. The heart is normal in size. No significant pericardial effusion. Mitral annular calcification. Left anterior descending  coronary calcification. Mild atherosclerotic plaque of the aorta. Lungs/Pleura: No focal consolidation. No pulmonary nodule. No pulmonary mass. No pulmonary contusion or laceration. No pneumatocele formation. No pleural effusion. No pneumothorax. No hemothorax. Mediastinum/Nodes: No pneumomediastinum. The central airways are patent. The esophagus is unremarkable. Tiny hiatal hernia. Enlarged and heterogeneous thyroid gland-follow-up with ultrasound. Limited evaluation for hilar lymphadenopathy on this noncontrast study. No mediastinal or axillary lymphadenopathy. Musculoskeletal/Chest wall No chest wall mass. No acute rib or sternal fracture. No spinal fracture. Multilevel bulky osteophyte formation. ABDOMEN / PELVIS: Hepatobiliary: Not enlarged. No focal lesion. Peripherally calcified gallstone within the gallbladder lumen. Gallstone measures up to 3 cm. No biliary ductal dilatation. Pancreas: Normal pancreatic contour. No main pancreatic duct dilatation. Spleen: Not enlarged. No focal lesion. Adrenals/Urinary Tract: No nodularity bilaterally. No hydroureteronephrosis. No nephroureterolithiasis. No contour deforming renal mass. The urinary bladder is unremarkable. Stomach/Bowel: No small or large bowel wall thickening or dilatation. The appendix is unremarkable. Vasculature/Lymphatic: Mild to moderate atherosclerotic plaque. No abdominal aorta or iliac aneurysm. No abdominal, pelvic, inguinal lymphadenopathy. Reproductive: Status post  hysterectomy. Bilateral adnexa are unremarkable. Other: No simple free fluid ascites. No pneumoperitoneum. No mesenteric hematoma identified. No organized fluid collection. Musculoskeletal: No significant soft tissue hematoma. Question acute nondisplaced right femoral neck fracture (2:116). No spinal fracture. Multilevel moderate severe degenerative changes of the spine multilevel intervertebral disc space vacuum phenomenon and mild retrolisthesis of L5 on S1. Ports and Devices: None. IMPRESSION: 1. Question acute nondisplaced right femoral neck fracture. Recommend dedicated three view right hip radiograph for further evaluation. 2. No acute intrathoracic, intra-abdominal, intrapelvic traumatic injury with limited evaluation on this noncontrast study. 3. No acute fracture or traumatic malalignment of the thoracic or lumbar spine. 4. Other imaging findings of potential clinical significance: Tiny hiatal hernia. Cholelithiasis with no CT evidence of acute cholecystitis. Aortic Atherosclerosis (ICD10-I70.0) including mitral annular and left anterior descending coronary calcification. Electronically Signed   By: Tish Frederickson M.D.   On: 05/20/2023 23:29   CT Head Wo Contrast  Result Date: 05/20/2023 CLINICAL DATA:  Head trauma, minor (Age >= 65y); Neck trauma (Age >= 65y); Facial trauma, blunt EXAM: CT HEAD WITHOUT CONTRAST CT MAXILLOFACIAL WITHOUT CONTRAST CT CERVICAL SPINE WITHOUT CONTRAST TECHNIQUE: Multidetector CT imaging of the head, cervical spine, and maxillofacial structures were performed using the standard protocol without intravenous contrast. Multiplanar CT image reconstructions of the cervical spine and maxillofacial structures were also generated. RADIATION DOSE REDUCTION: This exam was performed according to the departmental dose-optimization program which includes automated exposure control, adjustment of the mA and/or kV according to patient size and/or use of iterative reconstruction technique.  COMPARISON:  CT head 07/02/2017 FINDINGS: CT HEAD FINDINGS Brain: Trace patchy and confluent areas of decreased attenuation are noted throughout the deep and periventricular white matter of the cerebral hemispheres bilaterally, compatible with chronic microvascular ischemic disease. No evidence of large-territorial acute infarction. No parenchymal hemorrhage. No mass lesion. No extra-axial collection. No mass effect or midline shift. No hydrocephalus. Basilar cisterns are patent. Vascular: No hyperdense vessel. Skull: No acute fracture or focal lesion. Other: None. CT MAXILLOFACIAL FINDINGS Osseous: No fracture or mandibular dislocation. No destructive process. Sinuses/Orbits: Paranasal sinuses and mastoid air cells are clear. Bilateral lens replacement. Otherwise the orbits are unremarkable. Soft tissues: Negative. CT CERVICAL SPINE FINDINGS Alignment: Normal. Skull base and vertebrae: Multilevel severe degenerative changes of the spine with associated multilevel severe osseous neural foraminal stenosis. No severe osseous central canal stenosis. At least moderate osseous central canal stenosis at the C3-C4  level due to bulky posterior disc osteophyte complex formation and posterior longitudinal ligament calcification. No acute fracture. No aggressive appearing focal osseous lesion or focal pathologic process. Soft tissues and spinal canal: No prevertebral fluid or swelling. No visible canal hematoma. Upper chest: Unremarkable. Other: Enlarged heterogeneous thyroid gland. IMPRESSION: 1. No acute intracranial abnormality. 2.  No acute displaced facial fracture. 3. No acute displaced fracture or traumatic listhesis of the cervical spine. 4. Multilevel severe osseous neural foraminal stenosis. 5. At least moderate osseous central canal stenosis at the C3-C4 level. 6. Enlarged heterogeneous thyroid gland. Recommend thyroid ultrasound (ref: J Am Coll Radiol. 2015 Feb;12(2): 143-50). Electronically Signed   By: Tish Frederickson M.D.   On: 05/20/2023 23:16   CT Cervical Spine Wo Contrast  Result Date: 05/20/2023 CLINICAL DATA:  Head trauma, minor (Age >= 65y); Neck trauma (Age >= 65y); Facial trauma, blunt EXAM: CT HEAD WITHOUT CONTRAST CT MAXILLOFACIAL WITHOUT CONTRAST CT CERVICAL SPINE WITHOUT CONTRAST TECHNIQUE: Multidetector CT imaging of the head, cervical spine, and maxillofacial structures were performed using the standard protocol without intravenous contrast. Multiplanar CT image reconstructions of the cervical spine and maxillofacial structures were also generated. RADIATION DOSE REDUCTION: This exam was performed according to the departmental dose-optimization program which includes automated exposure control, adjustment of the mA and/or kV according to patient size and/or use of iterative reconstruction technique. COMPARISON:  CT head 07/02/2017 FINDINGS: CT HEAD FINDINGS Brain: Trace patchy and confluent areas of decreased attenuation are noted throughout the deep and periventricular white matter of the cerebral hemispheres bilaterally, compatible with chronic microvascular ischemic disease. No evidence of large-territorial acute infarction. No parenchymal hemorrhage. No mass lesion. No extra-axial collection. No mass effect or midline shift. No hydrocephalus. Basilar cisterns are patent. Vascular: No hyperdense vessel. Skull: No acute fracture or focal lesion. Other: None. CT MAXILLOFACIAL FINDINGS Osseous: No fracture or mandibular dislocation. No destructive process. Sinuses/Orbits: Paranasal sinuses and mastoid air cells are clear. Bilateral lens replacement. Otherwise the orbits are unremarkable. Soft tissues: Negative. CT CERVICAL SPINE FINDINGS Alignment: Normal. Skull base and vertebrae: Multilevel severe degenerative changes of the spine with associated multilevel severe osseous neural foraminal stenosis. No severe osseous central canal stenosis. At least moderate osseous central canal stenosis at the C3-C4  level due to bulky posterior disc osteophyte complex formation and posterior longitudinal ligament calcification. No acute fracture. No aggressive appearing focal osseous lesion or focal pathologic process. Soft tissues and spinal canal: No prevertebral fluid or swelling. No visible canal hematoma. Upper chest: Unremarkable. Other: Enlarged heterogeneous thyroid gland. IMPRESSION: 1. No acute intracranial abnormality. 2.  No acute displaced facial fracture. 3. No acute displaced fracture or traumatic listhesis of the cervical spine. 4. Multilevel severe osseous neural foraminal stenosis. 5. At least moderate osseous central canal stenosis at the C3-C4 level. 6. Enlarged heterogeneous thyroid gland. Recommend thyroid ultrasound (ref: J Am Coll Radiol. 2015 Feb;12(2): 143-50). Electronically Signed   By: Tish Frederickson M.D.   On: 05/20/2023 23:16   CT Maxillofacial Wo Contrast  Result Date: 05/20/2023 CLINICAL DATA:  Head trauma, minor (Age >= 65y); Neck trauma (Age >= 65y); Facial trauma, blunt EXAM: CT HEAD WITHOUT CONTRAST CT MAXILLOFACIAL WITHOUT CONTRAST CT CERVICAL SPINE WITHOUT CONTRAST TECHNIQUE: Multidetector CT imaging of the head, cervical spine, and maxillofacial structures were performed using the standard protocol without intravenous contrast. Multiplanar CT image reconstructions of the cervical spine and maxillofacial structures were also generated. RADIATION DOSE REDUCTION: This exam was performed according to the departmental dose-optimization program which  includes automated exposure control, adjustment of the mA and/or kV according to patient size and/or use of iterative reconstruction technique. COMPARISON:  CT head 07/02/2017 FINDINGS: CT HEAD FINDINGS Brain: Trace patchy and confluent areas of decreased attenuation are noted throughout the deep and periventricular white matter of the cerebral hemispheres bilaterally, compatible with chronic microvascular ischemic disease. No evidence of  large-territorial acute infarction. No parenchymal hemorrhage. No mass lesion. No extra-axial collection. No mass effect or midline shift. No hydrocephalus. Basilar cisterns are patent. Vascular: No hyperdense vessel. Skull: No acute fracture or focal lesion. Other: None. CT MAXILLOFACIAL FINDINGS Osseous: No fracture or mandibular dislocation. No destructive process. Sinuses/Orbits: Paranasal sinuses and mastoid air cells are clear. Bilateral lens replacement. Otherwise the orbits are unremarkable. Soft tissues: Negative. CT CERVICAL SPINE FINDINGS Alignment: Normal. Skull base and vertebrae: Multilevel severe degenerative changes of the spine with associated multilevel severe osseous neural foraminal stenosis. No severe osseous central canal stenosis. At least moderate osseous central canal stenosis at the C3-C4 level due to bulky posterior disc osteophyte complex formation and posterior longitudinal ligament calcification. No acute fracture. No aggressive appearing focal osseous lesion or focal pathologic process. Soft tissues and spinal canal: No prevertebral fluid or swelling. No visible canal hematoma. Upper chest: Unremarkable. Other: Enlarged heterogeneous thyroid gland. IMPRESSION: 1. No acute intracranial abnormality. 2.  No acute displaced facial fracture. 3. No acute displaced fracture or traumatic listhesis of the cervical spine. 4. Multilevel severe osseous neural foraminal stenosis. 5. At least moderate osseous central canal stenosis at the C3-C4 level. 6. Enlarged heterogeneous thyroid gland. Recommend thyroid ultrasound (ref: J Am Coll Radiol. 2015 Feb;12(2): 143-50). Electronically Signed   By: Tish Frederickson M.D.   On: 05/20/2023 23:16     Assessment and Plan:   Preop risk evaluation -RCRI 3 (for cerebrovascular disease, preop insulin use, and high risk surgery) -DASI score 23.95, functional METs 5.6 -Given advanced age, underlying diabetes and CKD stage III, patient is at at least  moderate to high risk for planned thoracic spine surgery, would allow complete recovery of AKI and rhabdomyolysis before planned operation, no further cardiac workup is recommended at this time, although she is having questions regarding alternative management /will defer to neurosurgery   LBBB Severe MAC - severe MAC and abnormal septal motion noted on Echo - informed the patient   Fall Rhabdomyolysis  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

## 2023-05-24 DIAGNOSIS — M6282 Rhabdomyolysis: Secondary | ICD-10-CM | POA: Diagnosis not present

## 2023-05-24 LAB — DRUG SCREEN 10 W/CONF, SERUM
Amphetamines, IA: NEGATIVE ng/mL
Barbiturates, IA: NEGATIVE ug/mL
Benzodiazepines, IA: NEGATIVE ng/mL
Cocaine & Metabolite, IA: NEGATIVE ng/mL
Methadone, IA: NEGATIVE ng/mL
Opiates, IA: NEGATIVE ng/mL
Oxycodones, IA: NEGATIVE ng/mL
Phencyclidine, IA: NEGATIVE ng/mL
Propoxyphene, IA: NEGATIVE ng/mL
THC(Marijuana) Metabolite, IA: NEGATIVE ng/mL

## 2023-05-24 LAB — GLUCOSE, CAPILLARY
Glucose-Capillary: 101 mg/dL — ABNORMAL HIGH (ref 70–99)
Glucose-Capillary: 158 mg/dL — ABNORMAL HIGH (ref 70–99)
Glucose-Capillary: 143 mg/dL — ABNORMAL HIGH (ref 70–99)
Glucose-Capillary: 164 mg/dL — ABNORMAL HIGH (ref 70–99)

## 2023-05-24 LAB — LIPID PANEL
Cholesterol: 128 mg/dL (ref 0–200)
HDL: 30 mg/dL — ABNORMAL LOW (ref >40)
LDL Cholesterol: 77 mg/dL (ref 0–99)
Total CHOL/HDL Ratio: 4.3 RATIO
Triglycerides: 105 mg/dL (ref <150)
VLDL: 21 mg/dL (ref 0–40)

## 2023-05-24 NOTE — TOC Progression Note (Signed)
Transition of Care [facility]) - Progression Note    Patient Details  Name: Katherine Cook MRN: [id number] Date of Birth: 11/21/1940  Transition of Care [facility] CM/SW Contact   A [place], [place] Phone Number: 05/24/2023, 1:33 PM  Clinical Narrative:     CSW was informed by [facility] that pt was offered a bed.   CSW updated pt at bedside. [facility] was selected for SNF.    TOC will continue to follow.    Expected Discharge Plan: Skilled Nursing Facility Barriers to Discharge: Continued Medical Work up  Expected Discharge Plan and Services In-house Referral: Clinical Social Work     Living arrangements for the past 2 months: Single Family Home                                       Social Determinants of Health (SDOH) Interventions SDOH Screenings   Food Insecurity: No Food Insecurity (05/21/2023)  Housing: Low Risk  (05/21/2023)  Transportation Needs: No Transportation Needs (05/21/2023)  Utilities: Not At Risk (05/21/2023)  Tobacco Use: Low Risk  (05/21/2023)    Readmission Risk Interventions     No data to display

## 2023-05-24 NOTE — Progress Notes (Signed)
Neurosurgery Service Progress Note  Subjective: No acute events overnight, no new complaints   Objective: Vitals:   05/23/23 1618 05/23/23 1937 05/24/23 0449 05/24/23 0751  BP: 136/62 133/60 (!) 120/56 (!) 142/68  Pulse: 62 61 60 64  Resp: 18 18 18 18   Temp: (!) 97.5 F (36.4 C) 98 F (36.7 C) 97.6 F (36.4 C) 98.3 F (36.8 C)  TempSrc: Oral Oral    SpO2: 95% 93% 95% 93%  Weight:      Height:        Physical Exam: Strength 5/5 x4 except 4 to 4+/5 in the LLE diffusely, SILTx4, +continuous beats of clonus at the left ankle, no hoffman's  Assessment & Plan: 82 y.o. 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  -plan for OR 8/16 for thoracic discectomy, discussed with the patient, pending AKI/rhabdo recovery / medical optimization  Jadene Pierini  05/24/23 8:05 AM

## 2023-05-24 NOTE — Progress Notes (Signed)
Physical Therapy Treatment Patient Details Name: Katherine Cook MRN: [id number] DOB: 06-24-1941 Today's Date: 05/24/2023   History of Present Illness Katherine Cook is a 82 y.o. female who presented on 05/20/23 after a fall at home, down 10-12 hours. Admitted for Rhabdomyolysis. PMHx:  insulin-dependent  DM type II, diabetic neuropathy, hyperlipidemia, essential hypertension and CKD stage 3A    PT Comments  Pt greeted resting in bed and agreeable to session with steady progress towards acute goals. Session focused on bed mobility and transfer training with adherence and education on back precautions in light of upcoming thoracic discectomy planned 8/16. Pt able to roll to L sidelying and elevate trunk to sitting with max A as pt demonstrating poor UE strength. Pt able to maintain sitting balance without UE to engage in seated ADLs. Pt able to attempt transfers to stand x3 with RW for support, however pt with minimal hip clearance with max assist and bed pad and pt with poor LE engagement. Pt continues to be limited by significant weakness from baseline, decreased activity tolerance, and poor balance/postural reactions. Pt continues to benefit from skilled PT services to progress toward functional mobility goals.      If plan is discharge home, recommend the following: Two people to help with walking and/or transfers;Two people to help with bathing/dressing/bathroom   Can travel by private vehicle     No  Equipment Recommendations  Other (comment) (defer to post acute)    Recommendations for Other Services       Precautions / Restrictions Precautions Precautions: Fall Restrictions Weight Bearing Restrictions: No     Mobility  Bed Mobility Overal bed mobility: Needs Assistance Bed Mobility: Rolling, Sidelying to Sit, Sit to Sidelying Rolling: Max assist Sidelying to sit: Max assist     Sit to sidelying: Mod assist General bed mobility comments: heavy use of rails, cues needed; preparing  for surgery Friday 8/16 educated on log roll technique and all back precautions, able to compelte with max A    Transfers Overall transfer level: Needs assistance Equipment used: Rolling walker (2 wheels) Transfers: Sit to/from Stand, Bed to chair/wheelchair/BSC Sit to Stand: Max assist, From elevated surface           General transfer comment: pt needing max A to attempt standing with RW, x3 attempts with pt demonstrating poor effort through LEs to power up, minial hip clearance    Ambulation/Gait               General Gait Details: unable   Stairs             Wheelchair Mobility     Tilt Bed    Modified Rankin (Stroke Patients Only)       Balance Overall balance assessment: Needs assistance Sitting-balance support: Feet supported Sitting balance-Leahy Scale: Fair Sitting balance - Comments: able to maintain sitting EOB without UE for ADLs                                    Cognition Arousal: Alert Behavior During Therapy: Flat affect Overall Cognitive Status: No family/caregiver present to determine baseline cognitive functioning                                 General Comments: A&Ox4, follows all simple commands with increased time. Slowed processing with some difficulty getting words/sentences out  Exercises General Exercises - Lower Extremity Long Arc Quad: AROM, Right, Left, 10 reps, Seated Hip Flexion/Marching: AROM, Right, Left, 10 reps, Seated    General Comments        Pertinent Vitals/Pain Pain Assessment Pain Assessment: Faces Faces Pain Scale: Hurts little more Pain Location: back Pain Descriptors / Indicators: Discomfort, Grimacing, Guarding Pain Intervention(s): Monitored during session, Limited activity within patient's tolerance    Home Living                          Prior Function            PT Goals (current goals can now be found in the care plan section) Acute  Rehab PT Goals Patient Stated Goal: not stated PT Goal Formulation: With patient Time For Goal Achievement: 06/04/23 Progress towards PT goals: Progressing toward goals    Frequency    Min 1X/week      PT Plan      Co-evaluation              AM-PAC PT "6 Clicks" Mobility   Outcome Measure  Help needed turning from your back to your side while in a flat bed without using bedrails?: Total Help needed moving from lying on your back to sitting on the side of a flat bed without using bedrails?: Total Help needed moving to and from a bed to a chair (including a wheelchair)?: Total Help needed standing up from a chair using your arms (e.g., wheelchair or bedside chair)?: Total Help needed to walk in hospital room?: Total Help needed climbing 3-5 steps with a railing? : Total 6 Click Score: 6    End of Session   Activity Tolerance: Patient tolerated treatment well Patient left: with call bell/phone within reach;in bed;with bed alarm set (maximove pad in place) Nurse Communication: Mobility status PT Visit Diagnosis: Other abnormalities of gait and mobility (R26.89);Muscle weakness (generalized) (M62.81)     Time: 1610-9604 PT Time Calculation (min) (ACUTE ONLY): 25 min  Charges:    $Therapeutic Activity: 23-37 mins PT General Charges $$ ACUTE PT VISIT: 1 Visit                      R. PTA Acute Rehabilitation Services Office: [phone]   Catalina Antigua 05/24/2023, 2:44 PM

## 2023-05-24 NOTE — Progress Notes (Signed)
PROGRESS NOTE Katherine Cook  WJX:[id number] DOB: 05-04-1941 DOA: 05/20/2023 PCP: Katherine Cook., MD  Brief Narrative/Hospital Course: [phone] CKD 3B  b/l creatinine~1.4- 1.5, IDDM T II w/ diabetic neuropathy, hyperlipidemia, essential hypertension presented after fall and was on the floor almost for 12 hours,could not call for help, also developed some numbness on the left-sided leg due to lying on that most of the day which has been resolved in ED. In ED: Tmax 100.8 BP stable not hypoxic Labs showed AKI mild elevated AST, rhabdomyolysis, mild leukocytosis and admitted for further management.   Imaging with chest x-ray, pelvis x-ray, CT head CT C-spine CT maxillofacial, CT chest abdomen pelvis obtained-no acute finding, some thyroid abnormality ultrasound thyroid recommended, question acute on nondisplaced right femoral neck fracture> right femoral neck three-view x-ray recommended-did not show any fracture. Troponins 31> 30. She was admitted for rhabdomyolysis/AKI on CKD stage IIIb, placed on IVF. She had initial sensory changes in her left foot back in November after immobilizer boot was placed for traumatic left foot fracture and has progressed to entire leg with worsening LLE weakness after the boot was removed back in March with some improvement. Patient noted to have weakness of all muscle group on left lower extremity>MRI brain no evidence of acute stroke.  Seen by neurology underwent MRI C and T-spine. MRI showed thoracic disc herniation with cord signal change cervical stenosis with cord signal change Neurosurgery recommended thoracic discectomy .    Subjective: Seen and examined Feels better She states she spoke with the neurosurgery and going for surgery friday Overnight afebrile BP stable in 120s to 140s, on room air. Labs showed creatinine further downtrending at 1.7 CK3 7 1 LDL 77 CBC fairly stable with hemoglobin up to 9.3 g   Assessment and Plan: Principal Problem:    Rhabdomyolysis Active Problems:   Mechanical fall   Acute kidney injury superimposed on chronic kidney disease stage 3A (HCC)   Elevated troponin   Essential hypertension   Prolonged P-R interval   Hyperlipidemia   Insulin dependent type 2 diabetes mellitus (HCC)   Leukocytosis   Elevated bilirubin   Transaminitis   Enlarged thyroid   Dehydration  Ambulatory dysfunction-using cane or walker at baseline Fall at [facility] her leg gave out and on the floor for 12 hours 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 seen by cardiology for preop evaluation. Per discussion with patient's daughter- were reluctant for surgical intervention.  This morning when I spoke to the patient patient reports that she wants to go to the surgery and has spoken to her daughter she is alert awake oriented x 3 in sound mind she wishes her anesthesia is not prolonged and she has "faith in lord and doctors"  Neurosurgery following for surgical intervention-rhabdo has improved and creatinine downtrending likely close to baseline  AKI on CKD 3a: b/l creatinine~1.65 in 08/20/22 w/ egfr  31> previous creatinine was around 1.5> on admission up to 2.1-suspecting AKI, creatinine now down to 1.7 with IV fluids. ?  New baseline,?  Progressive CKD.  But would continue on gentle IV fluid hydration and continue to hold Lasix/losartan and Celebrex, and see if creatinine improves further preoperatively Recent Labs    05/20/23 2137 05/21/23 0713 05/22/23 0345 05/23/23 0140  05/24/23 0307  BUN 34* 28* 26* 21 18  CREATININE 1.98* 2.12* 1.87* 1.85* 1.75*  CO2 25 25 24 24 24    Rhabdomyolysis  secondary to mechanical fall: CK improved to  370 on ivf. Peaked to 2412  Pyuria Leukocytosis: UA grossly abnormal large leukocytes positive nitrite WBC more than 50-placed on empiric antibiotics, urine culture less than 10,070 growth, patient antibiotic will be discontinued WBC count has normalized.   Elevated troponin: Suspect demand ischemia in the setting of volume rhabdomyolysis, no EKG changes, echo showed EF 60 to 65% left ventricle demonstrates regional wall abnormality suggestive of LBBB RV systolic function is normal severe mitral annular calcification. Will get cardio eval for preoperative risk stratifications.  Prolonged PR interval: Stable echo.  Mild transaminitis: Improving.  Essential hypertension: BP well-controlled. Home meds on hold  Incidental finding of enlarged thyroid gland: CT head showed incidental finding of enlarged thyroid gland.Recommended thyroid ultrasound. TSH/FT4 level normal,enlarged heterogeneous thyroid without discrete nodules seen in the ultrasound-advise outpatient follow-up.  IDDM type II: Blood sugar is controlled,A1c stable 6.4.Continue SSI and Accu-Chek ACHS. Recent Labs  Lab 05/21/23 0713 05/21/23 0746 05/22/23 2123 05/23/23 0754 05/23/23 1150 05/23/23 1550 05/24/23 0801  GLUCAP  --    < > 180* 99 127* 132* 101*  HGBA1C 6.4*  --   --   --   --   --   --    < > = values in this interval not displayed.    Class II Obesity: Patient's Body mass index is 39.26 kg/m. : Will benefit with PCP follow-up, weight loss  healthy lifestyle and outpatient sleep evaluation.  Baseline memory issues: on donepezil,she is alert awake oriented and fairly stable cognitive status.   Deconditioning/debility: Continue PT OT anticipating skilled nursing facility  DVT prophylaxis: heparin injection 5,000 Units Start: 05/21/23 0600 SCDs Start: 05/21/23 0450 Place TED hose Start: 05/21/23 0450 Code Status:   Code Status: Full Code Family  Communication: plan of care discussed with patient  at bedside. I had called and updated Katherine Cook previously, no answer when called on 8/12, called Katherine Cook  05/23/23 and updated.  Patient status is: Inpatient because of AKI rhabdomyolysis and fall Level of care: Telemetry Medical  Dispo: The patient is from: Home, lives alone.            Anticipated disposition: TBD  Objective: Vitals last 24 hrs: Vitals:   05/23/23 1618 05/23/23 1937 05/24/23 0449 05/24/23 0751  BP: 136/62 133/60 (!) 120/56 (!) 142/68  Pulse: 62 61 60 64  Resp: 18 18 18 18   Temp: (!) 97.5 F (36.4 C) 98 F (36.7 C) 97.6 F (36.4 C) 98.3 F (36.8 C)  TempSrc: Oral Oral    SpO2: 95% 93% 95% 93%  Weight:      Height:      Weight change:   Physical Examination: General exam: alert awake, oriented x 3 HEENT:Oral mucosa moist, Ear/Nose WNL grossly Respiratory system: Bilaterally clear BS,no use of accessory muscle Cardiovascular system: S1 & S2 +, No JVD. Gastrointestinal system: Abdomen soft,NT,ND, BS+ Nervous System: Alert, awake, moving all extremities with weakness on LLE Extremities: LE edema neg,distal peripheral pulses palpable and warm.  Skin: No rashes,no icterus. MSK: Normal muscle bulk,tone, power   Medications reviewed:  Scheduled Meds:  ascorbic acid  1,000 mg Oral Daily   aspirin EC  81 mg Oral Daily   donepezil  10 mg Oral Daily   gabapentin  100 mg Oral BID   heparin  5,000 Units Subcutaneous  Q8H   insulin aspart  0-6 Units Subcutaneous TID WC   pantoprazole  40 mg Oral Daily   sodium chloride flush  3 mL Intravenous Q12H  Continuous Infusions:  sodium chloride     cefTRIAXone (ROCEPHIN)  IV 1 g (05/23/23 1047)   lactated ringers 100 mL/hr at 05/23/23 1043    Diet Order             Diet Carb Modified Fluid consistency: Thin; Room service appropriate? Yes  Diet effective now                  Intake/Output Summary (Last 24 hours) at 05/24/2023 1113 Last data filed at 05/24/2023  1041 Gross per 24 hour  Intake --  Output 1350 ml  Net -1350 ml   Net IO Since Admission: -2,935.05 mL [05/24/23 1113]  Wt Readings from Last 3 Encounters:  05/22/23 124.1 kg  09/06/22 126.6 kg  07/05/19 (!) 144.2 kg   Unresulted Labs (From admission, onward)     Start     Ordered   05/23/23 0500  Basic metabolic panel  Daily,   R      05/22/23 1016   05/22/23 0500  CK  Daily,   R      05/21/23 1151   05/21/23 1105  Drug Screen 10 W/Conf, Serum  Once,   R        05/21/23 1104   05/21/23 0500  CBC  Daily,   R      05/21/23 0449          Data Reviewed: I have personally reviewed following labs and imaging studies CBC: Recent Labs  Lab 05/20/23 2137 05/21/23 0713 05/22/23 0345 05/23/23 0140 05/24/23 0307  WBC 12.0* 10.1 8.9 8.6 7.7  NEUTROABS 9.5*  --   --   --   --   HGB 10.2* 9.2* 9.9* 8.8* 9.3*  HCT 30.0* 27.9* 30.0* 26.8* 28.0*  MCV 89.6 89.4 89.3 89.6 89.7  PLT 170 158 168 166 205   Basic Metabolic Panel: Recent Labs  Lab 05/20/23 2137 05/21/23 0713 05/22/23 0345 05/23/23 0140 05/24/23 0307  NA 142 143 141 141 143  K 4.8 4.6 4.0 4.4 4.5  CL 106 107 109 108 112*  CO2 25 25 24 24 24   GLUCOSE 210* 128* 127* 124* 142*  BUN 34* 28* 26* 21 18  CREATININE 1.98* 2.12* 1.87* 1.85* 1.75*  CALCIUM 9.2 9.0 8.9 8.8* 8.7*   GFR: Estimated Creatinine Clearance: 36.1 mL/min (A) (by C-G formula based on SCr of 1.75 mg/dL (H)). Liver Function Tests: Recent Labs  Lab 05/20/23 2137 05/21/23 0713 05/22/23 0345 05/23/23 0140  AST 71* 71* 62* 44*  ALT 23 23 23 22   ALKPHOS 34* 30* 32* 30*  BILITOT 1.4* 1.2 1.2 0.7  PROT 6.7 6.3* 6.2* 5.9*  ALBUMIN 3.4* 2.9* 2.8* 2.5*   Recent Labs  Lab 05/20/23 2137  LIPASE 27   No results for input(s): "HGBA1C" in the last 72 hours.  CBG: Recent Labs  Lab 05/22/23 2123 05/23/23 0754 05/23/23 1150 05/23/23 1550 05/24/23 0801  GLUCAP 180* 99 127* 132* 101*   No results for input(s): "TSH", "T4TOTAL", "FREET4",  "T3FREE", "THYROIDAB" in the last 72 hours.  Sepsis Labs: No results for input(s): "PROCALCITON", "LATICACIDVEN" in the last 168 hours.  Recent Results (from the past 240 hour(s))  SARS Coronavirus 2 by RT PCR (hospital order, performed in [facility] hospital lab) *cepheid single result test* Anterior Nasal Swab  Status: None   Collection Time: 05/20/23  9:37 PM   Specimen: Anterior Nasal Swab  Result Value Ref Range Status   SARS Coronavirus 2 by RT PCR NEGATIVE NEGATIVE Final    Comment: (NOTE) SARS-CoV-2 target nucleic acids are NOT DETECTED.  The SARS-CoV-2 RNA is generally detectable in upper and lower respiratory specimens during the acute phase of infection. The lowest concentration of SARS-CoV-2 viral copies this assay can detect is 250 copies / mL. A negative result does not preclude SARS-CoV-2 infection and should not be used as the sole basis for treatment or other patient management decisions.  A negative result may occur with improper specimen collection / handling, submission of specimen other than nasopharyngeal swab, presence of viral mutation(s) within the areas targeted by this assay, and inadequate number of viral copies (<250 copies / mL). A negative result must be combined with clinical observations, patient history, and epidemiological information.  Fact Sheet for Patients:   RoadLapTop.co.za  Fact Sheet for Healthcare Providers: http://kim-miller.com/  This test is not yet approved or  cleared by the [place] FDA and has been authorized for detection and/or diagnosis of SARS-CoV-2 by FDA under an Emergency Use Authorization (EUA).  This EUA will remain in effect (meaning this test can be used) for the duration of the COVID-19 declaration under Section 564(b)(1) of the Act, 21 U.S.C. section 360bbb-3(b)(1), unless the authorization is terminated or revoked sooner.  Performed at [facility],  [address] [place], [place] [address]   Urine Culture (for pregnant, neutropenic or urologic patients or patients with an indwelling urinary catheter)     Status: Abnormal   Collection Time: 05/22/23 11:52 PM   Specimen: Urine, Clean Catch  Result Value Ref Range Status   Specimen Description URINE, CLEAN CATCH  Final   Special Requests NONE  Final   Culture (A)  Final    <10,000 COLONIES/mL INSIGNIFICANT GROWTH Performed at [facility], [address] [place], [place] [address]    Report Status 05/24/2023 FINAL  Final    Antimicrobials: Anti-infectives (From admission, onward)    Start     Dose/Rate Route Frequency Ordered Stop   05/22/23 1100  cefTRIAXone (ROCEPHIN) 1 g in sodium chloride 0.9 % 100 mL IVPB        1 g 200 mL/hr over 30 Minutes Intravenous Every 24 hours 05/22/23 1015 05/25/23 1114      Culture/Microbiology    Component Value Date/Time   SDES URINE, CLEAN CATCH 05/22/2023 2352   SPECREQUEST NONE 05/22/2023 2352   CULT (A) 05/22/2023 2352    <10,000 COLONIES/mL INSIGNIFICANT GROWTH Performed at [facility], [address], [place], [place] [address]    REPTSTATUS 05/24/2023 FINAL 05/22/2023 2352    Radiology Studies: MR THORACIC SPINE WO CONTRAST  Result Date: 05/22/2023 CLINICAL DATA:  Ataxia EXAM: MRI CERVICAL AND THORACIC SPINE WITHOUT CONTRAST TECHNIQUE: Multiplanar and multiecho pulse sequences of the cervical spine, to include the craniocervical junction and cervicothoracic junction, and the thoracic spine, were obtained without intravenous contrast. COMPARISON:  CT cervical spine 05/22/2023 Lumbar spine MRI 03/11/2023 FINDINGS: MRI CERVICAL SPINE FINDINGS Alignment: Physiologic. Vertebrae: No fracture, evidence of discitis, or bone lesion. Cord: There is hyperintense T2-weighted signal within the spinal cord at the C3-4 level. Mild cord atrophy. Posterior Fossa, vertebral arteries, paraspinal tissues: Negative Disc levels: C1-2:  Unremarkable. C2-3: Small central disc protrusion and moderate left facet arthrosis. There is no spinal canal stenosis. No neural foraminal  stenosis. C3-4: Central disc protrusion and ossification of the posterior longitudinal ligament. Severe spinal canal stenosis. The spinal cord is compressed. Severe right and moderate left neural foraminal stenosis. C4-5: Intermediate sized disc bulge with ossification of the posterior longitudinal ligament. There is spinal cord compression. Severe spinal canal stenosis. Severe bilateral neural foraminal stenosis. C5-6: Right asymmetric disc osteophyte complex. Mild spinal canal stenosis. Severe right neural foraminal stenosis. C6-7: Small central disc osteophyte complex. There is no spinal canal stenosis. No neural foraminal stenosis. C7-T1: Normal disc space and facet joints. There is no spinal canal stenosis. No neural foraminal stenosis. MRI THORACIC SPINE FINDINGS Alignment:  Physiologic. Vertebrae: Inflated edema at T11-12.  No acute fracture. Cord: Mild spinal cord volume loss. Hyperintense T2-weighted signal within the conus medullaris at the T11-12 level. Paraspinal and other soft tissues: Negative Disc levels: There is a large left subarticular disc protrusion superimposed on a diffuse disc bulge at T11-12 causing severe spinal canal stenosis with impingement of the spinal cord. There is also severe bilateral foraminal stenosis. The other thoracic disc levels are unremarkable. IMPRESSION: 1. 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 2. Large left subarticular disc protrusion superimposed on a diffuse disc bulge at T11-12 causing severe spinal canal stenosis with impingement of the spinal cord. There is also severe bilateral neural foraminal stenosis at this level. 3. Severe bilateral C4-5 and right C5-6 neural foraminal  stenosis. Critical Value/emergent results were called by telephone at the time of interpretation on 05/22/2023 at 8:55 pm to provider ERIC [facility] , who verbally acknowledged these results. Electronically Signed   By: Deatra Robinson M.D.   On: 05/22/2023 20:57   MR CERVICAL SPINE WO CONTRAST  Result Date: 05/22/2023 CLINICAL DATA:  Ataxia EXAM: MRI CERVICAL AND THORACIC SPINE WITHOUT CONTRAST TECHNIQUE: Multiplanar and multiecho pulse sequences of the cervical spine, to include the craniocervical junction and cervicothoracic junction, and the thoracic spine, were obtained without intravenous contrast. COMPARISON:  CT cervical spine 05/22/2023 Lumbar spine MRI 03/11/2023 FINDINGS: MRI CERVICAL SPINE FINDINGS Alignment: Physiologic. Vertebrae: No fracture, evidence of discitis, or bone lesion. Cord: There is hyperintense T2-weighted signal within the spinal cord at the C3-4 level. Mild cord atrophy. Posterior Fossa, vertebral arteries, paraspinal tissues: Negative Disc levels: C1-2: Unremarkable. C2-3: Small central disc protrusion and moderate left facet arthrosis. There is no spinal canal stenosis. No neural foraminal stenosis. C3-4: Central disc protrusion and ossification of the posterior longitudinal ligament. Severe spinal canal stenosis. The spinal cord is compressed. Severe right and moderate left neural foraminal stenosis. C4-5: Intermediate sized disc bulge with ossification of the posterior longitudinal ligament. There is spinal cord compression. Severe spinal canal stenosis. Severe bilateral neural foraminal stenosis. C5-6: Right asymmetric disc osteophyte complex. Mild spinal canal stenosis. Severe right neural foraminal stenosis. C6-7: Small central disc osteophyte complex. There is no spinal canal stenosis. No neural foraminal stenosis. C7-T1: Normal disc space and facet joints. There is no spinal canal stenosis. No neural foraminal stenosis. MRI THORACIC SPINE FINDINGS Alignment:  Physiologic.  Vertebrae: Inflated edema at T11-12.  No acute fracture. Cord: Mild spinal cord volume loss. Hyperintense T2-weighted signal within the conus medullaris at the T11-12 level. Paraspinal and other soft tissues: Negative Disc levels: There is a large left subarticular disc protrusion superimposed on a diffuse disc bulge at T11-12 causing severe spinal canal stenosis with impingement of the spinal cord. There is  also severe bilateral foraminal stenosis. The other thoracic disc levels are unremarkable. IMPRESSION: 1. 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 2. Large left subarticular disc protrusion superimposed on a diffuse disc bulge at T11-12 causing severe spinal canal stenosis with impingement of the spinal cord. There is also severe bilateral neural foraminal stenosis at this level. 3. Severe bilateral C4-5 and right C5-6 neural foraminal stenosis. Critical Value/emergent results were called by telephone at the time of interpretation on 05/22/2023 at 8:55 pm to provider ERIC [facility] , who verbally acknowledged these results. Electronically Signed   By: Deatra Robinson M.D.   On: 05/22/2023 20:57     LOS: 3 days   Lanae Boast, MD [facility]  05/24/2023, 11:13 AM

## 2023-05-24 NOTE — Plan of Care (Signed)
  Problem: Activity: Goal: Risk for activity intolerance will decrease Outcome: Progressing   Problem: Nutrition: Goal: Adequate nutrition will be maintained Outcome: Progressing   Problem: Coping: Goal: Level of anxiety will decrease Outcome: Progressing   Problem: Safety: Goal: Ability to remain free from injury will improve Outcome: Progressing   Problem: Skin Integrity: Goal: Risk for impaired skin integrity will decrease Outcome: Progressing   Problem: Coping: Goal: Ability to adjust to condition or change in health will improve Outcome: Progressing

## 2023-05-25 DIAGNOSIS — M6282 Rhabdomyolysis: Secondary | ICD-10-CM | POA: Diagnosis not present

## 2023-05-25 LAB — GLUCOSE, CAPILLARY
Glucose-Capillary: 98 mg/dL (ref 70–99)
Glucose-Capillary: 115 mg/dL — ABNORMAL HIGH (ref 70–99)
Glucose-Capillary: 149 mg/dL — ABNORMAL HIGH (ref 70–99)
Glucose-Capillary: 160 mg/dL — ABNORMAL HIGH (ref 70–99)

## 2023-05-25 MED ORDER — MELATONIN 3 MG PO TABS
3.0000 mg | ORAL_TABLET | Freq: Every day | ORAL | Status: DC
  Administered 2023-05-25 – 2023-06-09 (×15): 3 mg via ORAL
  Filled 2023-05-25 (×15): qty 1

## 2023-05-25 NOTE — Progress Notes (Signed)
PROGRESS NOTE Rini Ghio  VHQ:[id number] DOB: [birth year] DOA: 05/20/2023 PCP: Earley Brooke., MD  Brief Narrative/Hospital Course: [phone] CKD 3B  b/l creatinine~1.4- 1.5, IDDM T II w/ diabetic neuropathy, hyperlipidemia, essential hypertension presented after fall and was on the floor almost for 12 hours,could not call for help, also developed some numbness on the left-sided leg due to lying on that most of the day which has been resolved in ED. In ED: Tmax 100.8 BP stable not hypoxic Labs showed AKI mild elevated AST, rhabdomyolysis, mild leukocytosis and admitted for further management.   Imaging with chest x-ray, pelvis x-ray, CT head CT C-spine CT maxillofacial, CT chest abdomen pelvis obtained-no acute finding, some thyroid abnormality ultrasound thyroid recommended, question acute on nondisplaced right femoral neck fracture> right femoral neck three-view x-ray recommended-did not show any fracture. Troponins 31> 30. She was admitted for rhabdomyolysis/AKI on CKD stage IIIb, placed on IVF. She had initial sensory changes in her left foot back in November after immobilizer boot was placed for traumatic left foot fracture and has progressed to entire leg with worsening LLE weakness after the boot was removed back in March with some improvement. Patient noted to have weakness of all muscle group on left lower extremity>MRI brain no evidence of acute stroke.  Seen by neurology underwent MRI C and T-spine. MRI showed thoracic disc herniation with cord signal change cervical stenosis with cord signal change Neurosurgery recommended thoracic discectomy .    Subjective: Patient seen and examined this morning Did not sleep well No new complaints Overnight patient has been afebrile, BP stable  Labs pending this morning     Assessment and Plan: Principal Problem:   Rhabdomyolysis Active Problems:   Mechanical fall   Acute kidney injury superimposed on chronic kidney disease stage 3A (HCC)    Elevated troponin   Essential hypertension   Prolonged P-R interval   Hyperlipidemia   Insulin dependent type 2 diabetes mellitus (HCC)   Leukocytosis   Elevated bilirubin   Transaminitis   Enlarged thyroid   Dehydration  Ambulatory dysfunction-using cane or walker at baseline Fall at [facility] her leg gave out and on the floor for 12 hours 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 Felt that cervical myelopathy is chronic and asymptomatic. seen by cardiology for preop evaluation. Per discussion 8/12>patient's daughter- was reluctant for surgical intervention but patient has informed that she wants to proceed with OR.   AKI on CKD 3a: b/l creatinine~1.65 in 08/20/22 w/ egfr  31> previous creatinine was around 1.5> on admission up to 2.1-suspecting AKI, creatinine has improved to 1.6 suspect her baseline encourage oral intake decrease IV fluids, CK has improved.  Continue to hold Lasix/losartan and Celebrex Recent Labs    05/20/23 2137 05/21/23 0713 05/22/23 0345 05/23/23 0140 05/24/23 0307 05/25/23 0655  BUN 34* 28* 26* 21 18 17   CREATININE 1.98* 2.12* 1.87* 1.85* 1.75* 1.61*  CO2 25 25 24 24 24 26    Rhabdomyolysis secondary to mechanical fall: CK improved to  318. From 2412. On ivf.  Pyuria Leukocytosis: UA grossly abnormal large leukocytes positive nitrite WBC more than 50-placed on empiric antibiotics, urine culture less than 10,070 growth, patient was empirically treated with antibiotics x 3 days.  Afebrile WC count stable   Elevated troponin: Suspect demand ischemia in the setting of  volume rhabdomyolysis, no EKG changes, echo showed EF 60 to 65% left ventricle demonstrates regional wall abnormality suggestive of LBBB RV systolic  function is normal severe mitral annular calcification.  Seen by cardiology   Prolonged PR interval: Stable echo.  Mild transaminitis: Improving.  Essential hypertension: BP well-controlled. Home meds on hold  Incidental finding of enlarged thyroid gland: CT head showed incidental finding of enlarged thyroid gland.Recommended thyroid ultrasound. TSH/FT4 level normal,enlarged heterogeneous thyroid without discrete nodules seen in the ultrasound-advise outpatient follow-up.  IDDM type II: Blood sugar is controlled,A1c stable 6.4.continue sliding scale insulin and monitor blood sugar as below  Recent Labs  Lab 05/21/23 0713 05/21/23 0746 05/24/23 0801 05/24/23 1151 05/24/23 1607 05/24/23 1937 05/25/23 0734  GLUCAP  --    < > 101* 158* 143* 164* 98  HGBA1C 6.4*  --   --   --   --   --   --    < > = values in this interval not displayed.    Class II Obesity: Patient's Body mass index is 39.26 kg/m. : Will benefit with PCP follow-up, weight loss  healthy lifestyle and outpatient sleep evaluation.  Baseline memory issues: on donepezil,she is alert awake oriented and fairly stable cognitive status.   Deconditioning/debility: Continue PT OT anticipating skilled nursing facility  DVT prophylaxis: heparin injection 5,000 Units Start: 05/21/23 0600 SCDs Start: 05/21/23 0450 Place TED hose Start: 05/21/23 0450 Code Status:   Code Status: Full Code Family Communication: plan of care discussed with patient  at bedside. I had called and updated [name] previously, no answer when called on 8/12, called [name]  05/23/23 and updated.  Patient status is: Inpatient because of AKI rhabdomyolysis and fall Level of care: Telemetry Medical  Dispo: The patient is from: Home, lives alone.            Anticipated disposition: Anticipated SNF  Objective: Vitals last 24 hrs: Vitals:   05/24/23 2134 05/25/23 0052 05/25/23 0441 05/25/23 0737  BP: 134/63 (!) 128/57 (!) 147/77 (!) 138/56   Pulse:  62 63 67  Resp:  19 17 18   Temp:  98.6 F (37 C) 97.9 F (36.6 C) 97.9 F (36.6 C)  TempSrc:      SpO2:  96% 94% 93%  Weight:      Height:      Weight change:   Physical Examination: General exam: alert awake, oriented at baseline, older than stated age HEENT:Oral mucosa moist, Ear/Nose WNL grossly Respiratory system: Bilaterally clear BS,no use of accessory muscle Cardiovascular system: S1 & S2 +, No JVD. Gastrointestinal system: Abdomen soft,NT,ND, BS+ Nervous System: Alert, awake, moving both lower extremities while in bed, has weakness ON mobility of the left Extremities: LE edema neg,distal peripheral pulses palpable and warm.  Skin: No rashes,no icterus. MSK: Normal muscle bulk,tone, power   Medications reviewed:  Scheduled Meds:  ascorbic acid  1,000 mg Oral Daily   aspirin EC  81 mg Oral Daily   donepezil  10 mg Oral Daily   gabapentin  100 mg Oral BID   heparin  5,000 Units Subcutaneous Q8H   insulin aspart  0-6 Units Subcutaneous TID WC   pantoprazole  40 mg Oral Daily   sodium chloride flush  3 mL Intravenous Q12H  Continuous Infusions:  sodium chloride     lactated ringers 30 mL/hr at 05/25/23 0910    Diet Order             Diet Carb Modified Fluid consistency: Thin; Room service  appropriate? Yes  Diet effective now                  Intake/Output Summary (Last 24 hours) at 05/25/2023 1034 Last data filed at 05/25/2023 1025 Gross per 24 hour  Intake --  Output 2100 ml  Net -2100 ml   Net IO Since Admission: -4,835.05 mL [05/25/23 1034]  Wt Readings from Last 3 Encounters:  05/22/23 124.1 kg  09/06/22 126.6 kg  07/05/19 (!) 144.2 kg   Unresulted Labs (From admission, onward)     Start     Ordered   05/23/23 0500  Basic metabolic panel  Daily,   R      05/22/23 1016   05/22/23 0500  CK  Daily,   R      05/21/23 1151   05/21/23 0500  CBC  Daily,   R      05/21/23 0449          Data Reviewed: I have personally reviewed  following labs and imaging studies CBC: Recent Labs  Lab 05/20/23 2137 05/21/23 0713 05/22/23 0345 05/23/23 0140 05/24/23 0307 05/25/23 0655  WBC 12.0* 10.1 8.9 8.6 7.7 9.4  NEUTROABS 9.5*  --   --   --   --   --   HGB 10.2* 9.2* 9.9* 8.8* 9.3* 9.3*  HCT 30.0* 27.9* 30.0* 26.8* 28.0* 28.1*  MCV 89.6 89.4 89.3 89.6 89.7 90.1  PLT 170 158 168 166 205 210   Basic Metabolic Panel: Recent Labs  Lab 05/21/23 0713 05/22/23 0345 05/23/23 0140 05/24/23 0307 05/25/23 0655  NA 143 141 141 143 144  K 4.6 4.0 4.4 4.5 4.2  CL 107 109 108 112* 111  CO2 25 24 24 24 26   GLUCOSE 128* 127* 124* 142* 104*  BUN 28* 26* 21 18 17   CREATININE 2.12* 1.87* 1.85* 1.75* 1.61*  CALCIUM 9.0 8.9 8.8* 8.7* 8.8*   GFR: Estimated Creatinine Clearance: 39.2 mL/min (A) (by C-G formula based on SCr of 1.61 mg/dL (H)). Liver Function Tests: Recent Labs  Lab 05/20/23 2137 05/21/23 0713 05/22/23 0345 05/23/23 0140  AST 71* 71* 62* 44*  ALT 23 23 23 22   ALKPHOS 34* 30* 32* 30*  BILITOT 1.4* 1.2 1.2 0.7  PROT 6.7 6.3* 6.2* 5.9*  ALBUMIN 3.4* 2.9* 2.8* 2.5*   Recent Labs  Lab 05/20/23 2137  LIPASE 27   No results for input(s): "HGBA1C" in the last 72 hours.  CBG: Recent Labs  Lab 05/24/23 0801 05/24/23 1151 05/24/23 1607 05/24/23 1937 05/25/23 0734  GLUCAP 101* 158* 143* 164* 98   No results for input(s): "TSH", "T4TOTAL", "FREET4", "T3FREE", "THYROIDAB" in the last 72 hours.  Sepsis Labs: No results for input(s): "PROCALCITON", "LATICACIDVEN" in the last 168 hours.  Recent Results (from the past 240 hour(s))  SARS Coronavirus 2 by RT PCR (hospital order, performed in [facility] hospital lab) *cepheid single result test* Anterior Nasal Swab     Status: None   Collection Time: 05/20/23  9:37 PM   Specimen: Anterior Nasal Swab  Result Value Ref Range Status   SARS Coronavirus 2 by RT PCR NEGATIVE NEGATIVE Final    Comment: (NOTE) SARS-CoV-2 target nucleic acids are NOT  DETECTED.  The SARS-CoV-2 RNA is generally detectable in upper and lower respiratory specimens during the acute phase of infection. The lowest concentration of SARS-CoV-2 viral copies this assay can detect is 250 copies / mL. A negative result does not preclude SARS-CoV-2 infection and should not be used  as the sole basis for treatment or other patient management decisions.  A negative result may occur with improper specimen collection / handling, submission of specimen other than nasopharyngeal swab, presence of viral mutation(s) within the areas targeted by this assay, and inadequate number of viral copies (<250 copies / mL). A negative result must be combined with clinical observations, patient history, and epidemiological information.  Fact Sheet for Patients:   RoadLapTop.co.za  Fact Sheet for Healthcare Providers: http://kim-miller.com/  This test is not yet approved or  cleared by the [place] FDA and has been authorized for detection and/or diagnosis of SARS-CoV-2 by FDA under an Emergency Use Authorization (EUA).  This EUA will remain in effect (meaning this test can be used) for the duration of the COVID-19 declaration under Section 564(b)(1) of the Act, 21 U.S.C. section 360bbb-3(b)(1), unless the authorization is terminated or revoked sooner.  Performed at [facility], [address] [place], [place] [address]   Urine Culture (for pregnant, neutropenic or urologic patients or patients with an indwelling urinary catheter)     Status: Abnormal   Collection Time: 05/22/23 11:52 PM   Specimen: Urine, Clean Catch  Result Value Ref Range Status   Specimen Description URINE, CLEAN CATCH  Final   Special Requests NONE  Final   Culture (A)  Final    <10,000 COLONIES/mL INSIGNIFICANT GROWTH Performed at [facility], [address], [place], [place] [address]    Report Status 05/24/2023 FINAL  Final     Antimicrobials: Anti-infectives (From admission, onward)    Start     Dose/Rate Route Frequency Ordered Stop   05/22/23 1100  cefTRIAXone (ROCEPHIN) 1 g in sodium chloride 0.9 % 100 mL IVPB        1 g 200 mL/hr over 30 Minutes Intravenous Every 24 hours 05/22/23 1015 05/24/23 1151      Culture/Microbiology    Component Value Date/Time   SDES URINE, CLEAN CATCH 05/22/2023 2352   SPECREQUEST NONE 05/22/2023 2352   CULT (A) 05/22/2023 2352    <10,000 COLONIES/mL INSIGNIFICANT GROWTH Performed at [facility], [address] [place], [place] [address]    REPTSTATUS 05/24/2023 FINAL 05/22/2023 2352    Radiology Studies: No results found.   LOS: 4 days   Lanae Boast, MD [facility]  05/25/2023, 10:34 AM

## 2023-05-25 NOTE — Care Management Important Message (Signed)
Important Message  Patient Details  Name: Katherine Cook MRN: [id number] Date of Birth: [birth year]   Medicare Important Message Given:  Yes      Stefan Church 05/25/2023, 11:53 AM

## 2023-05-25 NOTE — Plan of Care (Signed)

## 2023-05-25 NOTE — Progress Notes (Signed)
Occupational Therapy Treatment Patient Details Name: Katherine Cook MRN: [id number] DOB: 1941/03/19 Today's Date: 05/25/2023   History of present illness Katherine Cook is a 82 y.o. female who presented on 05/20/23 after a fall at home, down 10-12 hours. Admitted for Rhabdomyolysis. PMHx:  insulin-dependent  DM type II, diabetic neuropathy, hyperlipidemia, essential hypertension and CKD stage 3A   OT comments  Pt is making steady progress towards their acute OT goals. Reviewed spinal precaution education sheet with pt in preparation for her upcoming surgery, she verbalized understanding and needed cues to practice log roll technique. Overall she continues to need mod-max A for bed mobility, and tolerated sitting for ~10 minutes unsupported with supervision A. In sitting pt was able to complete bimanual grooming tasks without overt LOB or rest break needed. OT to continue to follow acutely to facilitate progress towards established goals. Pt will continue to benefit from skilled inpatient follow up therapy, <3 hours/day.       If plan is discharge home, recommend the following:  A lot of help with walking and/or transfers;Two people to help with walking and/or transfers;A lot of help with bathing/dressing/bathroom;Two people to help with bathing/dressing/bathroom;Assistance with cooking/housework;Assistance with feeding;Help with stairs or ramp for entrance;Assist for transportation   Equipment Recommendations  None recommended by OT       Precautions / Restrictions Precautions Precautions: Fall Precaution Comments: back sx 8/16 Restrictions Weight Bearing Restrictions: No Other Position/Activity Restrictions: spinal precautions in room, reviewed.       Mobility Bed Mobility Overal bed mobility: Needs Assistance Bed Mobility: Rolling, Sidelying to Sit, Sit to Sidelying Rolling: Max assist Sidelying to sit: Mod assist     Sit to sidelying: Max assist      Transfers Overall transfer  level: Needs assistance                       Balance Overall balance assessment: Needs assistance Sitting-balance support: Feet supported Sitting balance-Leahy Scale: Fair Sitting balance - Comments: able to maintain sitting EOB without UE for ADLs                                   ADL either performed or assessed with clinical judgement   ADL Overall ADL's : Needs assistance/impaired     Grooming: Set up;Sitting Grooming Details (indicate cue type and reason): sitting unsupported EOB                             Functional mobility during ADLs: Maximal assistance (bed mobility) General ADL Comments: maximal cues and assist needed to get to EOB, fair balance during bimanual sitting tasks    Extremity/Trunk Assessment Upper Extremity Assessment Upper Extremity Assessment: Generalized weakness   Lower Extremity Assessment Lower Extremity Assessment: Defer to PT evaluation        Vision   Vision Assessment?: No apparent visual deficits   Perception Perception Perception: Not tested   Praxis Praxis Praxis: Not tested    Cognition Arousal: Alert Behavior During Therapy: Flat affect Overall Cognitive Status: No family/caregiver present to determine baseline cognitive functioning                                 General Comments: Overall WFL, reviewed back precautions with little carryover noted  General Comments VSS, no family present    Pertinent Vitals/ Pain       Pain Assessment Pain Assessment: Faces Faces Pain Scale: Hurts little more Pain Location: back Pain Descriptors / Indicators: Discomfort, Grimacing, Guarding Pain Intervention(s): Monitored during session, Limited activity within patient's tolerance   Frequency  Min 1X/week        Progress Toward Goals  OT Goals(current goals can now be found in the care plan section)  Progress towards OT goals: Progressing toward goals  Acute  Rehab OT Goals Patient Stated Goal: to go to rehab OT Goal Formulation: With patient Time For Goal Achievement: 06/04/23 Potential to Achieve Goals: Good ADL Goals Pt Will Perform Grooming: standing;with min assist Pt Will Perform Lower Body Dressing: with mod assist;sit to/from stand Pt Will Transfer to Toilet: with mod assist;stand pivot transfer;bedside commode Additional ADL Goal #1: Pt will complete bed mobility with min A as a precursor to ADLs   AM-PAC OT "6 Clicks" Daily Activity     Outcome Measure   Help from another person eating meals?: A Little Help from another person taking care of personal grooming?: A Little Help from another person toileting, which includes using toliet, bedpan, or urinal?: Total Help from another person bathing (including washing, rinsing, drying)?: A Lot Help from another person to put on and taking off regular upper body clothing?: A Little Help from another person to put on and taking off regular lower body clothing?: Total 6 Click Score: 13    End of Session    OT Visit Diagnosis: Unsteadiness on feet (R26.81);Other abnormalities of gait and mobility (R26.89);Muscle weakness (generalized) (M62.81);History of falling (Z91.81)   Activity Tolerance Patient tolerated treatment well   Patient Left in bed;with call bell/phone within reach;with bed alarm set   Nurse Communication Mobility status        Time: 1420-1440 OT Time Calculation (min): 20 min  Charges: OT General Charges $OT Visit: 1 Visit OT Treatments $Self Care/Home Management : 8-22 mins  Derenda Mis, OTR/L Acute Rehabilitation Services Office [phone] Secure Chat Communication Preferred   Donia Pounds 05/25/2023, 3:23 PM

## 2023-05-25 NOTE — Progress Notes (Signed)
Neurosurgery Service Progress Note  Subjective: No acute events overnight, no new complaints   Objective: Vitals:   05/24/23 2134 05/25/23 0052 05/25/23 0441 05/25/23 0737  BP: 134/63 (!) 128/57 (!) 147/77 (!) 138/56  Pulse:  62 63 67  Resp:  19 17 18   Temp:  98.6 F (37 C) 97.9 F (36.6 C) 97.9 F (36.6 C)  TempSrc:      SpO2:  96% 94% 93%  Weight:      Height:        Physical Exam: Strength 5/5 x4 except 4 to 4+/5 in the LLE diffusely, SILTx4, +continuous beats of clonus at the left ankle, no hoffman's  Assessment & Plan: 82 y.o. 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  -no change in plan, OR 8/16 for thoracic discectomy  Jadene Pierini  05/25/23 9:04 AM

## 2023-05-26 DIAGNOSIS — M6282 Rhabdomyolysis: Secondary | ICD-10-CM | POA: Diagnosis not present

## 2023-05-26 LAB — BASIC METABOLIC PANEL
Anion gap: 9 (ref 5–15)
BUN: 19 mg/dL (ref 8–23)
CO2: 23 mmol/L (ref 22–32)
Calcium: 8.6 mg/dL — ABNORMAL LOW (ref 8.9–10.3)
Chloride: 108 mmol/L (ref 98–111)
Creatinine, Ser: 1.6 mg/dL — ABNORMAL HIGH (ref 0.44–1.00)
GFR, Estimated: 32 mL/min — ABNORMAL LOW (ref >60)
Glucose, Bld: 112 mg/dL — ABNORMAL HIGH (ref 70–99)
Potassium: 4.5 mmol/L (ref 3.5–5.1)
Sodium: 140 mmol/L (ref 135–145)

## 2023-05-26 LAB — GLUCOSE, CAPILLARY
Glucose-Capillary: 104 mg/dL — ABNORMAL HIGH (ref 70–99)
Glucose-Capillary: 150 mg/dL — ABNORMAL HIGH (ref 70–99)
Glucose-Capillary: 123 mg/dL — ABNORMAL HIGH (ref 70–99)
Glucose-Capillary: 168 mg/dL — ABNORMAL HIGH (ref 70–99)

## 2023-05-26 LAB — CBC
HCT: 28.6 % — ABNORMAL LOW (ref 36.0–46.0)
Hemoglobin: 9.4 g/dL — ABNORMAL LOW (ref 12.0–15.0)
MCH: 30.4 pg (ref 26.0–34.0)
MCHC: 32.9 g/dL (ref 30.0–36.0)
MCV: 92.6 fL (ref 80.0–100.0)
Platelets: 213 10*3/uL (ref 150–400)
RBC: 3.09 MIL/uL — ABNORMAL LOW (ref 3.87–5.11)
RDW: 14.3 % (ref 11.5–15.5)
WBC: 8.4 10*3/uL (ref 4.0–10.5)
nRBC: 0 % (ref 0.0–0.2)

## 2023-05-26 NOTE — Plan of Care (Signed)

## 2023-05-26 NOTE — Progress Notes (Signed)
Neurosurgery Service Progress Note  Subjective: No acute events overnight, no new complaints, ready for surgery tomorrow, no questions / concerns today  Objective: Vitals:   05/25/23 1622 05/25/23 1941 05/26/23 0521 05/26/23 0754  BP: (!) 128/56 (!) 150/71 124/60 (!) 141/56  Pulse: 66 61 63 63  Resp: 18 16 18 18   Temp: 97.8 F (36.6 C) 99.2 F (37.3 C) 98 F (36.7 C) 98.3 F (36.8 C)  TempSrc: Oral Oral Oral Oral  SpO2: 93% 98% 95% 91%  Weight:      Height:        Physical Exam: Strength 5/5 x4 except 4 to 4+/5 in the LLE diffusely, SILTx4, +continuous beats of clonus at the left ankle, no hoffman's  Assessment & Plan: 82 y.o. 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  -no change in plan, OR 8/16 for thoracic discectomy  Jadene Pierini  05/26/23 11:14 AM

## 2023-05-26 NOTE — Progress Notes (Signed)
PROGRESS NOTE Katherine Cook  YNW:[id number] DOB: 12-02-1940 DOA: 05/20/2023 PCP: Earley Brooke., MD  Brief Narrative/Hospital Course: [phone] CKD 3B  b/l creatinine~1.4- 1.5, IDDM T II w/ diabetic neuropathy, hyperlipidemia, essential hypertension presented after fall and was on the floor almost for 12 hours,could not call for help, also developed some numbness on the left-sided leg due to lying on that most of the day which has been resolved in ED. In ED: Tmax 100.8 BP stable not hypoxic Labs showed AKI mild elevated AST, rhabdomyolysis, mild leukocytosis and admitted for further management.   Imaging with chest x-ray, pelvis x-ray, CT head CT C-spine CT maxillofacial, CT chest abdomen pelvis obtained-no acute finding, some thyroid abnormality ultrasound thyroid recommended, question acute on nondisplaced right femoral neck fracture> right femoral neck three-view x-ray recommended-did not show any fracture. Troponins 31> 30. She was admitted for rhabdomyolysis/AKI on CKD stage IIIb, placed on IVF. She had initial sensory changes in her left foot back in November after immobilizer boot was placed for traumatic left foot fracture and has progressed to entire leg with worsening LLE weakness after the boot was removed back in March with some improvement. Patient noted to have weakness of all muscle group on left lower extremity>MRI brain no evidence of acute stroke.  Seen by neurology underwent MRI C and T-spine. MRI showed thoracic disc herniation with cord signal change cervical stenosis with cord signal change Neurosurgery recommended thoracic discectomy .    Subjective: Seen and examined Restign well no complaints Overnight BP stable, afebrile  Renal function stabilized holding in 1.6 g    Assessment and Plan: Principal Problem:   Rhabdomyolysis Active Problems:   Mechanical fall   Acute kidney injury superimposed on chronic kidney disease stage 3A (HCC)   Elevated troponin   Essential  hypertension   Prolonged P-R interval   Hyperlipidemia   Insulin dependent type 2 diabetes mellitus (HCC)   Leukocytosis   Elevated bilirubin   Transaminitis   Enlarged thyroid   Dehydration  Ambulatory dysfunction-using cane/walker at baseline Fall at [facility] her leg gave out and was on the floor for 12 hours 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 Felt that cervical myelopathy is chronic and asymptomatic.  She has been dealing with her ambulatory dysfunction left leg weakness since last year November. seen by cardiology for preop evaluation. Per discussion 8/12>patient's daughter- was reluctant for surgical intervention but patient has informed that she wants to proceed with OR and her family aware.   AKI on CKD 3b: b/l creatinine~1.65 in 08/20/22 w/ egfr  31 in care everywhere ( likley her baseline)> previous creatinine in system was 1.54 in 07/05/2019> on admission up to 2.1-suspect AKI in the setting of rhabdomyolysis, dehydration, with IV fluids creatinine has improved and is stable at 11.6 Cont gentle ivf, po, if IV fluids medication continue to hold Lasix/losartan and Celebrex Recent Labs    05/20/23 2137 05/21/23 0713 05/22/23 0345 05/23/23 0140 05/24/23 0307 05/25/23 0655 05/26/23 0149  BUN 34* 28* 26* 21 18 17 19   CREATININE 1.98* 2.12* 1.87* 1.85* 1.75* 1.61* 1.60*  CO2 25 25 24 24 24 26 23    Rhabdomyolysis secondary to mechanical fall: CK improved to  318. From 2412. On ivf.  Pyuria Leukocytosis: UA grossly abnormal large leukocytes positive nitrite WBC more than 50-placed  on empiric antibiotics, urine culture less than 10,070 growth, patient was empirically treated with antibiotics x 3 days.  Afebrile and WC count stable    Elevated troponin Prolonged PR interval: Suspect demand ischemia in the setting of aki/rhabdomyolysis, no EKG changes, echo showed EF 60 to 65% left ventricle demonstrates regional wall abnormality suggestive of LBBB RV systolic function is normal severe mitral annular calcification.  Seen by cardiology and no intervention needed.   Mild transaminitis: Improving.  Essential hypertension: BP well-controlled. Home meds not needed to be resumed for now  Incidental finding of enlarged thyroid gland: CT head showed incidental finding of enlarged thyroid gland> ultrasound abdomen subsequently- without discrete nodules seen. Tsh/ft4 normaL. Advise outpatient follow-up.  IDDM type II: Blood sugar is controlled,A1c stable 6.4.continue sliding scale insulin and monitor blood sugar as below  Recent Labs  Lab 05/21/23 0713 05/21/23 0746 05/25/23 0734 05/25/23 1210 05/25/23 1618 05/25/23 2018 05/26/23 0756  GLUCAP  --    < > 98 115* 149* 160* 104*  HGBA1C 6.4*  --   --   --   --   --   --    < > = values in this interval not displayed.    Class II Obesity: Patient's Body mass index is 39.26 kg/m. : Will benefit with PCP follow-up, weight loss  healthy lifestyle and outpatient sleep evaluation.  Baseline memory issues: on donepezil,she is alert awake oriented and fairly stable cognitive status.   Deconditioning/debility: Continue PT OT anticipating skilled nursing facility  DVT prophylaxis: heparin injection 5,000 Units Start: 05/21/23 0600 SCDs Start: 05/21/23 0450 Place TED hose Start: 05/21/23 0450 Heparin needs to be held preop Code Status:   Code Status: Full Code Family Communication: plan of care discussed with patient  at bedside. I had called and updated [name] previously, no answer when called on 8/12, called [name]  05/23/23 and updated.  Patient status is: Inpatient because of AKI rhabdomyolysis and fall Level of care: Telemetry Medical  Dispo: The patient is from:  Home, lives alone.            Anticipated disposition: Anticipated SNF  Objective: Vitals last 24 hrs: Vitals:   05/25/23 1622 05/25/23 1941 05/26/23 0521 05/26/23 0754  BP: (!) 128/56 (!) 150/71 124/60 (!) 141/56  Pulse: 66 61 63 63  Resp: 18 16 18 18   Temp: 97.8 F (36.6 C) 99.2 F (37.3 C) 98 F (36.7 C) 98.3 F (36.8 C)  TempSrc: Oral Oral Oral Oral  SpO2: 93% 98% 95% 91%  Weight:      Height:      Weight change:   Physical Examination: General exam: alert awake, oriented x 3 HEENT:Oral mucosa moist, Ear/Nose WNL grossly Respiratory system: Bilaterally clear BS,no use of accessory muscle Cardiovascular system: S1 & S2 +,No JVD. Gastrointestinal system:Abdomen soft,NT,ND, BS+ Nervous System:Alert, awake, moving her UE/LE Extremities:LE edema neg,distal peripheral pulses palpable and warm.  Skin:No rashes,no icterus. AVW:UJWJXB muscle bulk,tone, power    Medications reviewed:  Scheduled Meds:  ascorbic acid  1,000 mg Oral Daily   aspirin EC  81 mg Oral Daily   donepezil  10 mg Oral Daily   gabapentin  100 mg Oral BID   heparin  5,000 Units Subcutaneous Q8H   insulin aspart  0-6 Units Subcutaneous TID WC   melatonin  3 mg Oral QHS   pantoprazole  40 mg Oral Daily   sodium chloride flush  3 mL Intravenous Q12H  Continuous Infusions:  sodium chloride  lactated ringers 30 mL/hr at 05/26/23 2595    Diet Order             Diet Carb Modified Fluid consistency: Thin; Room service appropriate? Yes  Diet effective now                  Intake/Output Summary (Last 24 hours) at 05/26/2023 1033 Last data filed at 05/26/2023 0757 Gross per 24 hour  Intake 118 ml  Output 900 ml  Net -782 ml   Net IO Since Admission: -5,617.05 mL [05/26/23 1033]  Wt Readings from Last 3 Encounters:  05/22/23 124.1 kg  09/06/22 126.6 kg  07/05/19 (!) 144.2 kg   Unresulted Labs (From admission, onward)     Start     Ordered   05/23/23 0500  Basic metabolic panel  Daily,   R       05/22/23 1016   05/21/23 0500  CBC  Daily,   R      05/21/23 0449          Data Reviewed: I have personally reviewed following labs and imaging studies CBC: Recent Labs  Lab 05/20/23 2137 05/21/23 0713 05/22/23 0345 05/23/23 0140 05/24/23 0307 05/25/23 0655 05/26/23 0149  WBC 12.0*   < > 8.9 8.6 7.7 9.4 8.4  NEUTROABS 9.5*  --   --   --   --   --   --   HGB 10.2*   < > 9.9* 8.8* 9.3* 9.3* 9.4*  HCT 30.0*   < > 30.0* 26.8* 28.0* 28.1* 28.6*  MCV 89.6   < > 89.3 89.6 89.7 90.1 92.6  PLT 170   < > 168 166 205 210 213   < > = values in this interval not displayed.   Basic Metabolic Panel: Recent Labs  Lab 05/22/23 0345 05/23/23 0140 05/24/23 0307 05/25/23 0655 05/26/23 0149  NA 141 141 143 144 140  K 4.0 4.4 4.5 4.2 4.5  CL 109 108 112* 111 108  CO2 24 24 24 26 23   GLUCOSE 127* 124* 142* 104* 112*  BUN 26* 21 18 17 19   CREATININE 1.87* 1.85* 1.75* 1.61* 1.60*  CALCIUM 8.9 8.8* 8.7* 8.8* 8.6*   GFR: Estimated Creatinine Clearance: 39.5 mL/min (A) (by C-G formula based on SCr of 1.6 mg/dL (H)). Liver Function Tests: Recent Labs  Lab 05/20/23 2137 05/21/23 0713 05/22/23 0345 05/23/23 0140  AST 71* 71* 62* 44*  ALT 23 23 23 22   ALKPHOS 34* 30* 32* 30*  BILITOT 1.4* 1.2 1.2 0.7  PROT 6.7 6.3* 6.2* 5.9*  ALBUMIN 3.4* 2.9* 2.8* 2.5*   Recent Labs  Lab 05/20/23 2137  LIPASE 27   No results for input(s): "HGBA1C" in the last 72 hours.  CBG: Recent Labs  Lab 05/25/23 0734 05/25/23 1210 05/25/23 1618 05/25/23 2018 05/26/23 0756  GLUCAP 98 115* 149* 160* 104*   No results for input(s): "TSH", "T4TOTAL", "FREET4", "T3FREE", "THYROIDAB" in the last 72 hours.  Sepsis Labs: No results for input(s): "PROCALCITON", "LATICACIDVEN" in the last 168 hours.  Recent Results (from the past 240 hour(s))  SARS Coronavirus 2 by RT PCR (hospital order, performed in [facility] hospital lab) *cepheid single result test* Anterior Nasal Swab     Status: None    Collection Time: 05/20/23  9:37 PM   Specimen: Anterior Nasal Swab  Result Value Ref Range Status   SARS Coronavirus 2 by RT PCR NEGATIVE NEGATIVE Final    Comment: (NOTE) SARS-CoV-2 target nucleic acids  are NOT DETECTED.  The SARS-CoV-2 RNA is generally detectable in upper and lower respiratory specimens during the acute phase of infection. The lowest concentration of SARS-CoV-2 viral copies this assay can detect is 250 copies / mL. A negative result does not preclude SARS-CoV-2 infection and should not be used as the sole basis for treatment or other patient management decisions.  A negative result may occur with improper specimen collection / handling, submission of specimen other than nasopharyngeal swab, presence of viral mutation(s) within the areas targeted by this assay, and inadequate number of viral copies (<250 copies / mL). A negative result must be combined with clinical observations, patient history, and epidemiological information.  Fact Sheet for Patients:   RoadLapTop.co.za  Fact Sheet for Healthcare Providers: http://kim-miller.com/  This test is not yet approved or  cleared by the [place] FDA and has been authorized for detection and/or diagnosis of SARS-CoV-2 by FDA under an Emergency Use Authorization (EUA).  This EUA will remain in effect (meaning this test can be used) for the duration of the COVID-19 declaration under Section 564(b)(1) of the Act, 21 U.S.C. section 360bbb-3(b)(1), unless the authorization is terminated or revoked sooner.  Performed at [facility], [address] [place], [place] [address]   Urine Culture (for pregnant, neutropenic or urologic patients or patients with an indwelling urinary catheter)     Status: Abnormal   Collection Time: 05/22/23 11:52 PM   Specimen: Urine, Clean Catch  Result Value Ref Range Status   Specimen Description URINE, CLEAN CATCH  Final    Special Requests NONE  Final   Culture (A)  Final    <10,000 COLONIES/mL INSIGNIFICANT GROWTH Performed at [facility], [address], [place], [place] [address]    Report Status 05/24/2023 FINAL  Final    Antimicrobials: Anti-infectives (From admission, onward)    Start     Dose/Rate Route Frequency Ordered Stop   05/22/23 1100  cefTRIAXone (ROCEPHIN) 1 g in sodium chloride 0.9 % 100 mL IVPB        1 g 200 mL/hr over 30 Minutes Intravenous Every 24 hours 05/22/23 1015 05/24/23 1151      Culture/Microbiology    Component Value Date/Time   SDES URINE, CLEAN CATCH 05/22/2023 2352   SPECREQUEST NONE 05/22/2023 2352   CULT (A) 05/22/2023 2352    <10,000 COLONIES/mL INSIGNIFICANT GROWTH Performed at [facility], [address] [place], [place] [address]    REPTSTATUS 05/24/2023 FINAL 05/22/2023 2352    Radiology Studies: No results found.   LOS: 5 days   Lanae Boast, MD [facility]  05/26/2023, 10:33 AM

## 2023-05-26 NOTE — Anesthesia Preprocedure Evaluation (Addendum)
Anesthesia Evaluation  Patient identified by MRN, date of birth, ID band Patient awake    Reviewed: Allergy & Precautions, NPO status , Patient's Chart, lab work & pertinent test results  Airway Mallampati: II  TM Distance: >3 FB Neck ROM: Full    Dental  (+) Teeth Intact, Dental Advisory Given, Chipped   Pulmonary neg pulmonary ROS   Pulmonary exam normal breath sounds clear to auscultation       Cardiovascular hypertension, Pt. on medications Normal cardiovascular exam Rhythm:Regular Rate:Normal     Neuro/Psych Thoracic Myelopaphy  Neuromuscular disease    GI/Hepatic Neg liver ROS,GERD  Medicated,,  Endo/Other  diabetes, Type 2, Insulin Dependent    Renal/GU Renal InsufficiencyRenal disease     Musculoskeletal negative musculoskeletal ROS (+)    Abdominal   Peds  Hematology  (+) Blood dyscrasia, anemia   Anesthesia Other Findings   Reproductive/Obstetrics                             Anesthesia Physical Anesthesia Plan  ASA: 3  Anesthesia Plan: General   Post-op Pain Management: Tylenol PO (pre-op)*   Induction: Intravenous  PONV Risk Score and Plan: 3 and Dexamethasone and Ondansetron  Airway Management Planned: Oral ETT and Video Laryngoscope Planned  Additional Equipment: Arterial line  Intra-op Plan:   Post-operative Plan: Extubation in OR  Informed Consent: I have reviewed the patients History and Physical, chart, labs and discussed the procedure including the risks, benefits and alternatives for the proposed anesthesia with the patient or authorized representative who has indicated his/her understanding and acceptance.     Dental advisory given  Plan Discussed with: CRNA  Anesthesia Plan Comments: (2nd IV after induction.)       Anesthesia Quick Evaluation

## 2023-05-26 NOTE — Progress Notes (Signed)
PT Cancellation Note  Patient Details Name: Katherine Cook MRN: [id number] DOB: [birth year]   Cancelled Treatment:    Reason Eval/Treat Not Completed: (P) Other (comment), attempted x2 throughout day, pt with visitor on initial attempt, pt on phone on second attempt waving this PTA off. Will check back as schedule allows to continue with PT POC.  Lenora Boys. PTA Acute Rehabilitation Services Office: [phone]    Catalina Antigua 05/26/2023, 4:33 PM

## 2023-05-27 ENCOUNTER — Inpatient Hospital Stay (HOSPITAL_COMMUNITY): Payer: Medicare HMO

## 2023-05-27 ENCOUNTER — Encounter (HOSPITAL_COMMUNITY)
Admission: EM | Disposition: A | Discharge status: Skilled Nursing Facility | Payer: Self-pay | Source: Home / Self Care | Attending: Internal Medicine

## 2023-05-27 ENCOUNTER — Encounter (HOSPITAL_COMMUNITY): Payer: Self-pay | Admitting: Internal Medicine

## 2023-05-27 ENCOUNTER — Other Ambulatory Visit: Payer: Self-pay

## 2023-05-27 DIAGNOSIS — M5104 Intervertebral disc disorders with myelopathy, thoracic region: Secondary | ICD-10-CM

## 2023-05-27 DIAGNOSIS — M6282 Rhabdomyolysis: Secondary | ICD-10-CM | POA: Diagnosis not present

## 2023-05-27 DIAGNOSIS — E039 Hypothyroidism, unspecified: Secondary | ICD-10-CM

## 2023-05-27 DIAGNOSIS — I129 Hypertensive chronic kidney disease with stage 1 through stage 4 chronic kidney disease, or unspecified chronic kidney disease: Secondary | ICD-10-CM

## 2023-05-27 DIAGNOSIS — N1831 Chronic kidney disease, stage 3a: Secondary | ICD-10-CM

## 2023-05-27 HISTORY — PX: THORACIC DISCECTOMY: SHX6113

## 2023-05-27 LAB — GLUCOSE, CAPILLARY
Glucose-Capillary: 105 mg/dL — ABNORMAL HIGH (ref 70–99)
Glucose-Capillary: 129 mg/dL — ABNORMAL HIGH (ref 70–99)
Glucose-Capillary: 184 mg/dL — ABNORMAL HIGH (ref 70–99)
Glucose-Capillary: 206 mg/dL — ABNORMAL HIGH (ref 70–99)
Glucose-Capillary: 198 mg/dL — ABNORMAL HIGH (ref 70–99)

## 2023-05-27 LAB — CBC
HCT: 31.3 % — ABNORMAL LOW (ref 36.0–46.0)
Hemoglobin: 10.2 g/dL — ABNORMAL LOW (ref 12.0–15.0)
MCH: 29.8 pg (ref 26.0–34.0)
MCHC: 32.6 g/dL (ref 30.0–36.0)
MCV: 91.5 fL (ref 80.0–100.0)
Platelets: 235 10*3/uL (ref 150–400)
RBC: 3.42 MIL/uL — ABNORMAL LOW (ref 3.87–5.11)
RDW: 14.3 % (ref 11.5–15.5)
WBC: 15 10*3/uL — ABNORMAL HIGH (ref 4.0–10.5)
nRBC: 0 % (ref 0.0–0.2)

## 2023-05-27 LAB — BASIC METABOLIC PANEL
Anion gap: 11 (ref 5–15)
BUN: 24 mg/dL — ABNORMAL HIGH (ref 8–23)
CO2: 22 mmol/L (ref 22–32)
Calcium: 9.1 mg/dL (ref 8.9–10.3)
Chloride: 107 mmol/L (ref 98–111)
Creatinine, Ser: 1.75 mg/dL — ABNORMAL HIGH (ref 0.44–1.00)
GFR, Estimated: 29 mL/min — ABNORMAL LOW (ref >60)
Glucose, Bld: 179 mg/dL — ABNORMAL HIGH (ref 70–99)
Potassium: 4.4 mmol/L (ref 3.5–5.1)
Sodium: 140 mmol/L (ref 135–145)

## 2023-05-27 LAB — SURGICAL PCR SCREEN
MRSA, PCR: NEGATIVE
Staphylococcus aureus: NEGATIVE

## 2023-05-27 LAB — ABO/RH: ABO/RH(D): A POS

## 2023-05-27 SURGERY — THORACIC DISCECTOMY
Anesthesia: General

## 2023-05-27 MED ORDER — ONDANSETRON HCL 4 MG/2ML IJ SOLN
INTRAMUSCULAR | Status: AC
  Filled 2023-05-27: qty 2

## 2023-05-27 MED ORDER — SUCCINYLCHOLINE CHLORIDE 200 MG/10ML IV SOSY
PREFILLED_SYRINGE | INTRAVENOUS | Status: AC
  Filled 2023-05-27: qty 10

## 2023-05-27 MED ORDER — LABETALOL HCL 5 MG/ML IV SOLN
INTRAVENOUS | Status: DC | PRN
  Administered 2023-05-27: 10 mg via INTRAVENOUS

## 2023-05-27 MED ORDER — PROPOFOL 10 MG/ML IV BOLUS
INTRAVENOUS | Status: AC
  Filled 2023-05-27: qty 20

## 2023-05-27 MED ORDER — SUGAMMADEX SODIUM 200 MG/2ML IV SOLN
INTRAVENOUS | Status: DC | PRN
  Administered 2023-05-27: 200 mg via INTRAVENOUS

## 2023-05-27 MED ORDER — LACTATED RINGERS IV SOLN: INTRAVENOUS | Status: DC

## 2023-05-27 MED ORDER — ORAL CARE MOUTH RINSE: 15.0000 mL | Freq: Once | OROMUCOSAL | Status: AC

## 2023-05-27 MED ORDER — CEFAZOLIN IN SODIUM CHLORIDE 3-0.9 GM/100ML-% IV SOLN
INTRAVENOUS | Status: AC
  Filled 2023-05-27: qty 100

## 2023-05-27 MED ORDER — FENTANYL CITRATE (PF) 250 MCG/5ML IJ SOLN
INTRAMUSCULAR | Status: DC | PRN
  Administered 2023-05-27: 100 ug via INTRAVENOUS
  Administered 2023-05-27: 50 ug via INTRAVENOUS
  Administered 2023-05-27: 100 ug via INTRAVENOUS

## 2023-05-27 MED ORDER — ROCURONIUM BROMIDE 10 MG/ML (PF) SYRINGE
PREFILLED_SYRINGE | INTRAVENOUS | Status: DC | PRN
  Administered 2023-05-27: 20 mg via INTRAVENOUS
  Administered 2023-05-27: 80 mg via INTRAVENOUS
  Administered 2023-05-27: 20 mg via INTRAVENOUS

## 2023-05-27 MED ORDER — SUCCINYLCHOLINE CHLORIDE 200 MG/10ML IV SOSY
PREFILLED_SYRINGE | INTRAVENOUS | Status: DC | PRN
  Administered 2023-05-27: 140 mg via INTRAVENOUS

## 2023-05-27 MED ORDER — CHLORHEXIDINE GLUCONATE 0.12 % MT SOLN: 15.0000 mL | Freq: Once | OROMUCOSAL | Status: AC

## 2023-05-27 MED ORDER — LIDOCAINE-EPINEPHRINE 1 %-1:100000 IJ SOLN
INTRAMUSCULAR | Status: DC | PRN
  Administered 2023-05-27: 10 mL

## 2023-05-27 MED ORDER — KETAMINE HCL 50 MG/5ML IJ SOSY
PREFILLED_SYRINGE | INTRAMUSCULAR | Status: AC
  Filled 2023-05-27: qty 5

## 2023-05-27 MED ORDER — DEXAMETHASONE SODIUM PHOSPHATE 10 MG/ML IJ SOLN
INTRAMUSCULAR | Status: DC | PRN
  Administered 2023-05-27: 4 mg via INTRAVENOUS

## 2023-05-27 MED ORDER — ACETAMINOPHEN 500 MG PO TABS
1000.0000 mg | ORAL_TABLET | Freq: Once | ORAL | Status: AC
  Administered 2023-05-27: 1000 mg via ORAL
  Filled 2023-05-27: qty 2

## 2023-05-27 MED ORDER — THROMBIN 5000 UNITS EX SOLR
OROMUCOSAL | Status: DC | PRN
  Administered 2023-05-27: 5 mL via TOPICAL

## 2023-05-27 MED ORDER — ROCURONIUM BROMIDE 10 MG/ML (PF) SYRINGE
PREFILLED_SYRINGE | INTRAVENOUS | Status: AC
  Filled 2023-05-27: qty 10

## 2023-05-27 MED ORDER — ONDANSETRON HCL 4 MG/2ML IJ SOLN: 4.0000 mg | Freq: Once | INTRAMUSCULAR | Status: DC | PRN

## 2023-05-27 MED ORDER — LIDOCAINE 2% (20 MG/ML) 5 ML SYRINGE
INTRAMUSCULAR | Status: DC | PRN
  Administered 2023-05-27: 60 mg via INTRAVENOUS

## 2023-05-27 MED ORDER — FENTANYL CITRATE (PF) 100 MCG/2ML IJ SOLN: 25.0000 ug | INTRAMUSCULAR | Status: DC | PRN

## 2023-05-27 MED ORDER — DEXTROSE 5 % IV SOLN
INTRAVENOUS | Status: DC | PRN
  Administered 2023-05-27: 3 g via INTRAVENOUS

## 2023-05-27 MED ORDER — PHENYLEPHRINE 80 MCG/ML (10ML) SYRINGE FOR IV PUSH (FOR BLOOD PRESSURE SUPPORT)
PREFILLED_SYRINGE | INTRAVENOUS | Status: AC
  Filled 2023-05-27: qty 10

## 2023-05-27 MED ORDER — CHLORHEXIDINE GLUCONATE 0.12 % MT SOLN
OROMUCOSAL | Status: AC
  Administered 2023-05-27: 15 mL via OROMUCOSAL
  Filled 2023-05-27: qty 15

## 2023-05-27 MED ORDER — KETAMINE HCL 10 MG/ML IJ SOLN
INTRAMUSCULAR | Status: DC | PRN
  Administered 2023-05-27: 15 mg via INTRAVENOUS
  Administered 2023-05-27: 10 mg via INTRAVENOUS

## 2023-05-27 MED ORDER — 0.9 % SODIUM CHLORIDE (POUR BTL) OPTIME
TOPICAL | Status: DC | PRN
  Administered 2023-05-27: 1000 mL

## 2023-05-27 MED ORDER — LIDOCAINE-EPINEPHRINE 1 %-1:100000 IJ SOLN
INTRAMUSCULAR | Status: AC
  Filled 2023-05-27: qty 1

## 2023-05-27 MED ORDER — DEXAMETHASONE SODIUM PHOSPHATE 10 MG/ML IJ SOLN
INTRAMUSCULAR | Status: AC
  Filled 2023-05-27: qty 1

## 2023-05-27 MED ORDER — PHENYLEPHRINE 80 MCG/ML (10ML) SYRINGE FOR IV PUSH (FOR BLOOD PRESSURE SUPPORT)
PREFILLED_SYRINGE | INTRAVENOUS | Status: DC | PRN
  Administered 2023-05-27 (×2): 80 ug via INTRAVENOUS
  Administered 2023-05-27: 160 ug via INTRAVENOUS
  Administered 2023-05-27: 80 ug via INTRAVENOUS

## 2023-05-27 MED ORDER — LIDOCAINE 2% (20 MG/ML) 5 ML SYRINGE
INTRAMUSCULAR | Status: AC
  Filled 2023-05-27: qty 5

## 2023-05-27 MED ORDER — EPHEDRINE 5 MG/ML INJ
INTRAVENOUS | Status: AC
  Filled 2023-05-27: qty 5

## 2023-05-27 MED ORDER — FENTANYL CITRATE (PF) 250 MCG/5ML IJ SOLN
INTRAMUSCULAR | Status: AC
  Filled 2023-05-27: qty 5

## 2023-05-27 MED ORDER — ONDANSETRON HCL 4 MG/2ML IJ SOLN
INTRAMUSCULAR | Status: DC | PRN
  Administered 2023-05-27: 4 mg via INTRAVENOUS

## 2023-05-27 MED ORDER — PHENYLEPHRINE HCL-NACL 20-0.9 MG/250ML-% IV SOLN
INTRAVENOUS | Status: DC | PRN
  Administered 2023-05-27: 20 ug/min via INTRAVENOUS

## 2023-05-27 MED ORDER — PROPOFOL 10 MG/ML IV BOLUS
INTRAVENOUS | Status: DC | PRN
Start: 2023-05-27 — End: 2023-05-27
  Administered 2023-05-27: 150 mg via INTRAVENOUS
  Administered 2023-05-27: 50 mg via INTRAVENOUS
  Administered 2023-05-27: 200 mg via INTRAVENOUS

## 2023-05-27 MED ORDER — LABETALOL HCL 5 MG/ML IV SOLN
INTRAVENOUS | Status: AC
  Filled 2023-05-27: qty 4

## 2023-05-27 MED ORDER — THROMBIN 5000 UNITS EX SOLR
CUTANEOUS | Status: AC
  Filled 2023-05-27: qty 5000

## 2023-05-27 SURGICAL SUPPLY — 56 items
ADH SKN CLS APL DERMABOND .7 (GAUZE/BANDAGES/DRESSINGS) ×1
APL SKNCLS STERI-STRIP NONHPOA (GAUZE/BANDAGES/DRESSINGS)
BAG COUNTER SPONGE SURGICOUNT (BAG) ×1 IMPLANT
BAG SPNG CNTER NS LX DISP (BAG) ×1
BENZOIN TINCTURE PRP APPL 2/3 (GAUZE/BANDAGES/DRESSINGS) IMPLANT
BLADE CLIPPER SURG (BLADE) IMPLANT
BUR MATCHSTICK NEURO 3.0 LAGG (BURR) ×1 IMPLANT
BUR PRECISION FLUTE 5.0 (BURR) ×1 IMPLANT
CANISTER SUCT 3000ML PPV (MISCELLANEOUS) ×1 IMPLANT
CNTNR URN SCR LID CUP LEK RST (MISCELLANEOUS) ×1 IMPLANT
CONT SPEC 4OZ STRL OR WHT (MISCELLANEOUS) ×1
DERMABOND ADVANCED .7 DNX12 (GAUZE/BANDAGES/DRESSINGS) ×1 IMPLANT
DRAPE C-ARM 42X72 X-RAY (DRAPES) ×2 IMPLANT
DRAPE LAPAROTOMY 100X72X124 (DRAPES) ×1 IMPLANT
DRAPE MICROSCOPE SLANT 54X150 (MISCELLANEOUS) ×1 IMPLANT
DRAPE SURG 17X23 STRL (DRAPES) ×1 IMPLANT
DURAPREP 26ML APPLICATOR (WOUND CARE) ×1 IMPLANT
ELECT REM PT RETURN 9FT ADLT (ELECTROSURGICAL) ×1
ELECTRODE REM PT RTRN 9FT ADLT (ELECTROSURGICAL) ×1 IMPLANT
GAUZE 4X4 16PLY ~~LOC~~+RFID DBL (SPONGE) IMPLANT
GAUZE SPONGE 4X4 12PLY STRL (GAUZE/BANDAGES/DRESSINGS) IMPLANT
GLOVE BIO SURGEON STRL SZ7.5 (GLOVE) ×1 IMPLANT
GLOVE BIOGEL PI IND STRL 7.0 (GLOVE) ×1 IMPLANT
GLOVE BIOGEL PI IND STRL 7.5 (GLOVE) ×1 IMPLANT
GLOVE ECLIPSE 7.5 STRL STRAW (GLOVE) ×1 IMPLANT
GOWN STRL REUS W/ TWL LRG LVL3 (GOWN DISPOSABLE) ×2 IMPLANT
GOWN STRL REUS W/ TWL XL LVL3 (GOWN DISPOSABLE) IMPLANT
GOWN STRL REUS W/TWL 2XL LVL3 (GOWN DISPOSABLE) IMPLANT
GOWN STRL REUS W/TWL LRG LVL3 (GOWN DISPOSABLE) ×2
GOWN STRL REUS W/TWL XL LVL3 (GOWN DISPOSABLE) ×2
HEMOSTAT POWDER KIT SURGIFOAM (HEMOSTASIS) ×1 IMPLANT
KIT BASIN OR (CUSTOM PROCEDURE TRAY) ×1 IMPLANT
KIT POSITION SURG JACKSON T1 (MISCELLANEOUS) ×1 IMPLANT
KIT TURNOVER KIT B (KITS) ×1 IMPLANT
NDL HYPO 18GX1.5 BLUNT FILL (NEEDLE) IMPLANT
NDL HYPO 22X1.5 SAFETY MO (MISCELLANEOUS) ×1 IMPLANT
NDL SPNL 18GX3.5 QUINCKE PK (NEEDLE) IMPLANT
NEEDLE HYPO 18GX1.5 BLUNT FILL (NEEDLE) IMPLANT
NEEDLE HYPO 22X1.5 SAFETY MO (MISCELLANEOUS) ×1 IMPLANT
NEEDLE SPNL 18GX3.5 QUINCKE PK (NEEDLE) IMPLANT
NS IRRIG 1000ML POUR BTL (IV SOLUTION) ×1 IMPLANT
PACK LAMINECTOMY NEURO (CUSTOM PROCEDURE TRAY) ×1 IMPLANT
PAD ARMBOARD 7.5X6 YLW CONV (MISCELLANEOUS) ×3 IMPLANT
SPIKE FLUID TRANSFER (MISCELLANEOUS) ×1 IMPLANT
SPONGE SURGIFOAM ABS GEL 100 (HEMOSTASIS) IMPLANT
SPONGE T-LAP 4X18 ~~LOC~~+RFID (SPONGE) IMPLANT
STRIP CLOSURE SKIN 1/2X4 (GAUZE/BANDAGES/DRESSINGS) IMPLANT
SUT MNCRL AB 3-0 PS2 18 (SUTURE) ×1 IMPLANT
SUT NURALON 4 0 TR CR/8 (SUTURE) ×1 IMPLANT
SUT VIC AB 0 CT1 18XCR BRD8 (SUTURE) ×1 IMPLANT
SUT VIC AB 0 CT1 8-18 (SUTURE) ×1
SYR 3ML LL SCALE MARK (SYRINGE) IMPLANT
TOWEL GREEN STERILE (TOWEL DISPOSABLE) ×1 IMPLANT
TOWEL GREEN STERILE FF (TOWEL DISPOSABLE) ×1 IMPLANT
TRAY FOLEY MTR SLVR 16FR STAT (SET/KITS/TRAYS/PACK) ×1 IMPLANT
WATER STERILE IRR 1000ML POUR (IV SOLUTION) ×1 IMPLANT

## 2023-05-27 NOTE — Progress Notes (Signed)
Alert, oriented x 4 ( place by process of elimination). VSS, room air/ r radial aline removed, pressure drsg appl;ied / back incision cdi. Report called an sent to 2w34

## 2023-05-27 NOTE — Anesthesia Procedure Notes (Signed)
Procedure Name: Intubation Date/Time: 05/27/2023 7:46 AM  Performed by: Camillia Herter, CRNAPre-anesthesia Checklist: Patient identified, Emergency Drugs available, Suction available and Patient being monitored Patient Re-evaluated:Patient Re-evaluated prior to induction Oxygen Delivery Method: Circle System Utilized Preoxygenation: Pre-oxygenation with 100% oxygen Induction Type: IV induction Ventilation: Mask ventilation without difficulty Laryngoscope Size: Glidescope and 3 Grade View: Grade I Tube type: Oral Tube size: 7.5 mm Number of attempts: 1 Airway Equipment and Method: Stylet and Video-laryngoscopy Placement Confirmation: ETT inserted through vocal cords under direct vision, positive ETCO2 and breath sounds checked- equal and bilateral Tube secured with: Tape Dental Injury: Teeth and Oropharynx as per pre-operative assessment

## 2023-05-27 NOTE — Transfer of Care (Signed)
Immediate Anesthesia Transfer of Care Note  Patient: Katherine Cook  Procedure(s) Performed: THORACIC DISCECTOMY THORACICI ELEVEN-TWELVE  Patient Location: PACU  Anesthesia Type:General  Level of Consciousness: drowsy  Airway & Oxygen Therapy: Patient Spontanous Breathing and Patient connected to nasal cannula oxygen  Post-op Assessment: Report given to RN and Post -op Vital signs reviewed and stable  Post vital signs: Reviewed and stable  Last Vitals:  Vitals Value Taken Time  BP 143/78 05/27/23 1045  Temp    Pulse 54 05/27/23 1052  Resp 16 05/27/23 1052  SpO2 93 % 05/27/23 1052  Vitals shown include unfiled device data.  Last Pain:  Vitals:   05/27/23 0647  TempSrc: Oral  PainSc:          Complications: No notable events documented.

## 2023-05-27 NOTE — Progress Notes (Signed)
Neurosurgery Service Progress Note  Subjective: No acute events overnight, no new complaints, ready for surgery   Objective: Vitals:   05/26/23 1617 05/26/23 1957 05/27/23 0414 05/27/23 0647  BP: 131/65 139/71 134/66 (!) 152/74  Pulse: 63 68 66 70  Resp:  18 16 17   Temp: 98.6 F (37 C) 99 F (37.2 C) 98.5 F (36.9 C) 98.7 F (37.1 C)  TempSrc: Oral  Oral Oral  SpO2: 97% 98% 95%   Weight:   125.2 kg   Height:        Physical Exam: Strength 5/5 x4 except 4 to 4+/5 in the LLE diffusely, SILTx4, +continuous beats of clonus at the left ankle, no hoffman's  Assessment & Plan: 82 y.o. 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  -OR for thoracic discectomy, post-op should be able to return to regular floor bed  Jadene Pierini  05/27/23 7:10 AM

## 2023-05-27 NOTE — Progress Notes (Signed)
Neurosurgery Service Post-operative progress note  Assessment & Plan: 82 y.o. woman s/p transpedicular thoracic discectomy, seen in PACU, MAEx4 with stable exam.  -activity as tolerated, no restrictions, no brace needed, okay to work with PT/OT -hold DVT chemoprophylaxis until 8/18  Katherine Cook A Katherine Cook  05/27/23 10:18 AM

## 2023-05-27 NOTE — Anesthesia Procedure Notes (Signed)
Arterial Line Insertion Start/End8/16/2024 7:45 AM, 05/27/2023 7:49 AM Performed by: Camillia Herter, CRNA, CRNA  Patient location: OR. Preanesthetic checklist: patient identified, IV checked, site marked, risks and benefits discussed, surgical consent, monitors and equipment checked, pre-op evaluation, timeout performed and anesthesia consent Patient sedated Right, radial was placed Catheter size: 20 G Hand hygiene performed , maximum sterile barriers used  and Seldinger technique used  Attempts: 2 Procedure performed without using ultrasound guided technique. Following insertion, dressing applied and Biopatch. Post procedure assessment: normal and unchanged  Patient tolerated the procedure well with no immediate complications.

## 2023-05-27 NOTE — Op Note (Signed)
PATIENT: Katherine Cook  DAY OF SURGERY: 05/27/23   PRE-OPERATIVE DIAGNOSIS:  Thoracic myelopathy due to disc herniation   POST-OPERATIVE DIAGNOSIS:  Same   PROCEDURE:  T11-T12 transpedicular thoracic discectomy   SURGEON:  Surgeon(s) and Role:    Jadene Pierini, MD    Emilee Hero PA   ANESTHESIA: ETGA   BRIEF HISTORY: This is an 82 year old woman who presented with LLE weakness with ankle clonus. Workup showed cervical stenosis as well as a thoracic disc herniation with cord signal change. Her symptoms and exam best correlated to her thoracic disc, I therefore recommended a discectomy at that level. This was discussed with the patient as well as risks, benefits, and alternatives and wished to proceed with surgery.   OPERATIVE DETAIL: The patient was taken to the operating room, anesthesia was induced by the anesthesia team, and the patient was placed on the OR table in the prone position. A formal time out was performed with two patient identifiers and confirmed the operative site. The operative site was marked, hair was clipped with surgical clippers, the area was then prepped and draped in a sterile fashion. Fluoroscopy was used to localize the level by counting ribs then counting up from the sacrum twice. A linear incision was placed in the midline, subperiosteal dissection was performed, and the count was repeated using fluoroscopy to confirm the correct level.  A decompressive laminectomy was performed with significant calcified and adherent ligamentum flavum. The right T12 pedicle was then partially removed along with the ipsilateral facet to create a trajectory to the disc without any cord manipulation for the discectomy. A large disc herniation was presented. A thoracic discectomy was performed in the usual fashion by creating a defect in the disc space and reducing the fragments away from the cord. There was a layer of thick ligament that was adherent to the cord but otherwise  decompressed. The cord was well decompressed and, given the associated risks of further dissection, I opted to leave a thin layer behind on the ventral thecal sac. Hemostasis was confirmed. During discectomy, the superior portion of a pituitary rongeur broke off in the disc space. This was retrieved in its entirety and a final xray was performed to confirm no remnant was remaining inside the patient.  Alli Consentino PA was scrubbed and assisted with the procedure which included exposure, discectomy and closure.  The wound was copiously irrigated, all instrument and sponge counts were correct, and the incision was then closed in layers. The patient was then returned to anesthesia for emergence. No apparent complications at the completion of the procedure.   EBL:    DRAINS: none   SPECIMENS: none   Jadene Pierini, MD

## 2023-05-27 NOTE — Progress Notes (Signed)
Progress Note   Patient: Katherine Cook XBJ:[id number] DOB: 12-Aug-1941 DOA: 05/20/2023     6 DOS: the patient was seen and examined on 05/27/2023   Brief hospital course: 82yof CKD 3B  b/l creatinine~1.4- 1.5, IDDM T II w/ diabetic neuropathy, hyperlipidemia, essential hypertension presented after fall and was on the floor almost for 12 hours,could not call for help, also developed some numbness on the left-sided leg due to lying on that most of the day which has been resolved in ED. In ED: Tmax 100.8 BP stable not hypoxic Labs showed AKI mild elevated AST, rhabdomyolysis, mild leukocytosis and admitted for further management.   Imaging with chest x-ray, pelvis x-ray, CT head CT C-spine CT maxillofacial, CT chest abdomen pelvis obtained-no acute finding, some thyroid abnormality ultrasound thyroid recommended, question acute on nondisplaced right femoral neck fracture> right femoral neck three-view x-ray recommended-did not show any fracture. Troponins 31> 30. She was admitted for rhabdomyolysis/AKI on CKD stage IIIb, placed on IVF. She had initial sensory changes in her left foot back in November after immobilizer boot was placed for traumatic left foot fracture and has progressed to entire leg with worsening LLE weakness after the boot was removed back in March with some improvement. Patient noted to have weakness of all muscle group on left lower extremity>MRI brain no evidence of acute stroke.  Seen by neurology underwent MRI C and T-spine. MRI showed thoracic disc herniation with cord signal change cervical stenosis with cord signal change Neurosurgery recommended thoracic discectomy . 05/27/2023 Underwent T11-T12 transpedicular thoracic discectomy with Dr. Maurice Small. Assessment and Plan: Principal Problem:   Mechanical fall Resulting in:   Rhabdomyolysis Trended down and clinically resolved. Check total CK in AM.  Active Problems:   Acute kidney injury superimposed on  chronic kidney  disease stage 3A (HCC) Trending down. Close to baseline.    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 Maurice Small today. Analgesics as needed Postop care per neurosurgery recommendations.    Essential hypertension Continue as needed hydralazine.    Hyperlipidemia Rosuvastatin was held due to rhabdomyolysis. Resume if total CK has normalized.    Insulin dependent type 2 diabetes mellitus (HCC) Carbohydrate modified diet. CBG monitoring with RI SS. Continue Levemir 10 units SQ twice daily. Check hemoglobin A1c.    Leukocytosis Resolved.    Elevated bilirubin   Transaminitis Check total bilirubin level in AM.    Enlarged thyroid Incidental finding on CT. Workup negative. Outpatient follow-up.    Dehydration Resolved.  Subjective: Recently return from PACU.  No acute complaints at the moment.  Daughters at bedside.  Physical Exam: Vitals:   05/26/23 1617 05/26/23 1957 05/27/23 0414 05/27/23 0647  BP: 131/65 139/71 134/66 (!) 152/74  Pulse: 63 68 66 70  Resp:  18 16 17   Temp: 98.6 F (37 C) 99 F (37.2 C) 98.5 F (36.9 C) 98.7 F (37.1 C)  TempSrc: Oral  Oral Oral  SpO2: 97% 98% 95%   Weight:   125.2 kg   Height:       Physical Exam Vitals and nursing note reviewed.  Constitutional:      Appearance: Normal appearance.  HENT:     Head: Normocephalic.     Nose: No rhinorrhea.  Eyes:     General: No scleral icterus.    Pupils: Pupils are equal, round, and reactive to light.  Cardiovascular:     Rate and Rhythm: Normal rate and regular  rhythm.  Pulmonary:     Effort: Pulmonary effort is normal.     Breath sounds: Normal breath sounds.  Abdominal:     General: Bowel sounds are normal. There is no distension.     Palpations: Abdomen is soft.     Tenderness: There is no abdominal tenderness.  Musculoskeletal:     Cervical back: Neck supple.     Right  lower leg: No edema.     Left lower leg: No edema.  Skin:    General: Skin is warm and dry.  Neurological:     Mental Status: She is alert. Mental status is at baseline.  Psychiatric:        Mood and Affect: Mood normal.        Behavior: Behavior normal.    Data Reviewed:  There are no new results to review at this time.  Family Communication:   Disposition: Status is: Inpatient Remains inpatient appropriate because:   Planned Discharge Destination: Home  Time spent:  minutes  Author: Bobette Mo, MD 05/27/2023 8:40 AM  For on call review [email or web address].   This document was prepared using Dragon voice recognition software and may contain some unintended transcription errors.

## 2023-05-27 NOTE — Progress Notes (Addendum)
Bedside nurse reported that telemetry called her as they noticed some ST elevation.  Checking stat EKG and troponin. Patient does not have any chest pain.  She is hemodynamically stable.  No previous history of CAD.  Patient has been admitted for mechanical fall and rhabdomyolysis. Per chart review and patient admitted on 8/9 she has some slight elevated troponin which peaked to 37.  EKG from 8/9 showed normal sinus rhythm heart rate 84, Left bundle blanch block, no ST and T wave abnormality.  Echo obtained on 8/10 which showed preserved EF 60 to 65%, left nuclear wall motion abnormality suggestively bundle branch block, left ventricular concentric hypertrophy.  Diastolic heart function could not be evaluated. -Will follow-up with EKG and troponin.  Continue monitor for chest pain and cardiac monitoring.  Addendum: -EKG showed normal sinus rhythm heart rate 76.  No ST and T wave abnormality.  Troponin 16.  Denies any chest pain.  Ruled out ACS.   Katherine Coop, MD [facility] 05/27/2023, 10:54 PM

## 2023-05-28 LAB — CK: Total CK: 246 U/L — ABNORMAL HIGH (ref 38–234)

## 2023-05-28 LAB — GLUCOSE, CAPILLARY
Glucose-Capillary: 155 mg/dL — ABNORMAL HIGH (ref 70–99)
Glucose-Capillary: 113 mg/dL — ABNORMAL HIGH (ref 70–99)
Glucose-Capillary: 135 mg/dL — ABNORMAL HIGH (ref 70–99)
Glucose-Capillary: 155 mg/dL — ABNORMAL HIGH (ref 70–99)

## 2023-05-28 LAB — TROPONIN I (HIGH SENSITIVITY)
Troponin I (High Sensitivity): 16 ng/L (ref <18)
Troponin I (High Sensitivity): 18 ng/L — ABNORMAL HIGH (ref <18)

## 2023-05-28 LAB — BASIC METABOLIC PANEL
Anion gap: 11 (ref 5–15)
BUN: 33 mg/dL — ABNORMAL HIGH (ref 8–23)
CO2: 22 mmol/L (ref 22–32)
Calcium: 8.7 mg/dL — ABNORMAL LOW (ref 8.9–10.3)
Chloride: 108 mmol/L (ref 98–111)
Creatinine, Ser: 1.94 mg/dL — ABNORMAL HIGH (ref 0.44–1.00)
GFR, Estimated: 26 mL/min — ABNORMAL LOW (ref >60)
Glucose, Bld: 168 mg/dL — ABNORMAL HIGH (ref 70–99)
Potassium: 4.7 mmol/L (ref 3.5–5.1)
Sodium: 141 mmol/L (ref 135–145)

## 2023-05-28 LAB — HEPATIC FUNCTION PANEL
ALT: 24 U/L (ref 0–44)
AST: 30 U/L (ref 15–41)
Albumin: 2.7 g/dL — ABNORMAL LOW (ref 3.5–5.0)
Alkaline Phosphatase: 34 U/L — ABNORMAL LOW (ref 38–126)
Bilirubin, Direct: 0.1 mg/dL (ref 0.0–0.2)
Indirect Bilirubin: 0.6 mg/dL (ref 0.3–0.9)
Total Bilirubin: 0.7 mg/dL (ref 0.3–1.2)
Total Protein: 6.2 g/dL — ABNORMAL LOW (ref 6.5–8.1)

## 2023-05-28 NOTE — Anesthesia Postprocedure Evaluation (Signed)
Anesthesia Post Note  Patient: Katherine Cook  Procedure(s) Performed: THORACIC DISCECTOMY THORACICI ELEVEN-TWELVE     Patient location during evaluation: PACU Anesthesia Type: General Level of consciousness: awake and alert Pain management: pain level controlled Vital Signs Assessment: post-procedure vital signs reviewed and stable Respiratory status: spontaneous breathing, nonlabored ventilation, respiratory function stable and patient connected to nasal cannula oxygen Cardiovascular status: blood pressure returned to baseline and stable Postop Assessment: no apparent nausea or vomiting Anesthetic complications: no   No notable events documented.  Last Vitals:  Vitals:   05/28/23 0806 05/28/23 1236  BP: (!) 158/61 (!) 115/53  Pulse: 75 70  Resp:    Temp: 37.6 C   SpO2: 91% 91%    Last Pain:  Vitals:   05/28/23 1127  TempSrc:   PainSc: 9                  Collene Schlichter

## 2023-05-28 NOTE — Plan of Care (Signed)

## 2023-05-28 NOTE — Progress Notes (Signed)
Progress Note   Patient: Katherine Cook [id number]:[id number] DOB: 05-15-1941 DOA: 05/20/2023     7 DOS: the patient was seen and examined on 05/28/2023   Brief hospital course: 82yof CKD 3B  b/l creatinine~1.4- 1.5, IDDM T II w/ diabetic neuropathy, hyperlipidemia, essential hypertension presented after fall and was found to have rhabdomyolysis, fever, mild leukocytosis and admitted for further management.   She had initial sensory changes in her left foot back in November after immobilizer boot was placed for traumatic left foot fracture and has progressed to entire leg with worsening LLE weakness after the boot was removed back in March with some improvement. Patient noted to have weakness of all muscle group on left lower extremity>MRI brain no evidence of acute stroke.  Seen by neurology underwent MRI C and T-spine. MRI showed thoracic disc herniation with cord signal change cervical stenosis with cord signal change  Assessment and Plan:  Chest pain: resolved. EKG showed normal sinus rhythm heart rate 76. No ST and T wave abnormality. Troponin 16. Denies any chest pain. Ruled out ACS.   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 Maurice Small on 05/27/23 Pain control as needed Postop care per neurosurgery recommendations-will resume Lovenox later as recommended by neurology   Essential hypertension: Moderately controlled Continue as needed hydralazine.   Hyperlipidemia Rosuvastatin was held due to  rhabdomyolysis. Resume if total CK has normalized.   Insulin dependent type 2 diabetes mellitus (HCC): Hemoglobin A1c 6.4 Carbohydrate modified diet. Discontinued Levemir 10 units twice daily Give sliding scale insulin as needed, follow fingersticks   Leukocytosis: Resolved.   Elevated bilirubin and Transaminitis: Reviewed labs, almost resolved   Enlarged thyroid: Incidental finding on CT. Workup negative. Outpatient follow-up.   Dehydration: Resolved.  Stop IV fluids      Subjective: Patient reported having pain at the site of surgery on the back.  Pain gets worse with minimal movements Denies any fever, chills, shortness of breath, chest pain, nausea, vomiting, abdominal pain Last bowel movement yesterday  Patient reported having chest pain last night and underwent workup for ACS  Physical Exam: Vitals:   05/27/23 2043 05/28/23 0008 05/28/23 0613 05/28/23 0806  BP: (!) 149/66 (!) 147/67 (!) 124/55 (!) 158/61  Pulse: 72 77 79 75  Resp: 18 18 18    Temp: 99.8 F (37.7 C) 98.2 F (36.8 C) 98.1 F (36.7 C) 99.7 F (37.6 C)  TempSrc: Oral Oral Oral Oral  SpO2: 93% 97% 93% 91%  Weight:      Height:       Physical Exam Constitutional:      General: She is not in acute distress.    Appearance: Normal appearance. She is obese. She is ill-appearing.  HENT:     Head: Normocephalic.     Comments: Noted contusion near to eyes from recent fall    Nose: Nose normal. No congestion.     Mouth/Throat:     Mouth: Mucous membranes are moist.     Pharynx: Oropharynx is clear.  Eyes:     Extraocular Movements: Extraocular movements  intact.     Conjunctiva/sclera: Conjunctivae normal.     Pupils: Pupils are equal, round, and reactive to light.  Cardiovascular:     Rate and Rhythm: Normal rate and regular rhythm.     Pulses: Normal pulses.     Heart sounds: Normal heart sounds.  Pulmonary:     Effort: Pulmonary effort is normal. No respiratory distress.     Breath sounds:  Normal breath sounds.  Abdominal:     General: Abdomen is flat. Bowel sounds are normal.     Palpations: Abdomen is soft.  Musculoskeletal:        General: Tenderness present.     Right lower leg: No edema.     Left lower leg: No edema.     Comments: Unable to move her forward because of severe pain on the back  Skin:    General: Skin is warm.     Capillary Refill: Capillary refill takes 2 to 3 seconds.  Neurological:     General: No focal deficit present.     Mental Status: She is alert and oriented to person, place, and time.     Cranial Nerves: No cranial nerve deficit.     Sensory: No sensory deficit.  Psychiatric:        Mood and Affect: Mood normal.        Behavior: Behavior normal.     Data Reviewed:  Latest Reference Range & Units 05/27/23 12:13 05/28/23 04:50  Sodium 135 - 145 mmol/L 140   Potassium 3.5 - 5.1 mmol/L 4.4   Chloride 98 - 111 mmol/L 107   CO2 22 - 32 mmol/L 22   Glucose 70 - 99 mg/dL 161 (H)   BUN 8 - 23 mg/dL 24 (H)   Creatinine 0.96 - 1.00 mg/dL 0.45 (H)   Calcium 8.9 - 10.3 mg/dL 9.1   Anion gap 5 - 15  11   Alkaline Phosphatase 38 - 126 U/L  34 (L)  Albumin 3.5 - 5.0 g/dL  2.7 (L)  AST 15 - 41 U/L  30  ALT 0 - 44 U/L  24  Total Protein 6.5 - 8.1 g/dL  6.2 (L)  Bilirubin, Direct 0.0 - 0.2 mg/dL  0.1  Indirect Bilirubin 0.3 - 0.9 mg/dL  0.6  Total Bilirubin 0.3 - 1.2 mg/dL  0.7  (H): Data is abnormally high (L): Data is abnormally low  Family Communication:   Disposition: Status is: Inpatient   Planned Discharge Destination: TBD    Time spent: 35 minutes  Author: Ernestene Mention, MD 05/28/2023 11:43 AM  For on call review [email or web address].

## 2023-05-28 NOTE — Progress Notes (Signed)
   Providing Compassionate, Quality Care - Together  NEUROSURGERY PROGRESS NOTE   S: No issues overnight.   O: EXAM:  BP (!) 158/61 (BP Location: Right Arm)   Pulse 75   Temp 99.7 F (37.6 C) (Oral)   Resp 18   Ht 5\' 10"  (1.778 m)   Wt 125.2 kg   SpO2 91%   BMI 39.60 kg/m   Awake, alert, oriented x 3 PERRL Speech fluent, appropriate  CNs grossly intact  5/5 BUE Right lower extremity 4+/5 throughout, left lower extremity 4/5 throughout Incision clean dry and intact  ASSESSMENT:  82 y.o. female with   Status post T11-12 discectomy  PLAN: -PT/OT -Pain control     Thank you for allowing me to participate in this patient's care.  Please do not hesitate to call with questions or concerns.   [name], DO Neurosurgeon [facility] Cell: [phone]

## 2023-05-28 NOTE — Evaluation (Signed)
Physical Therapy Re-Evaluation Patient Details Name: Katherine Cook MRN: [id number] DOB: Dec 26, 1940 Today's Date: 05/28/2023  History of Present Illness  Pt is an 82 y.o. female admitted 05/20/23 after fall at home, down for 10-12 hrs. Workup for rhabdomyolysis, AKI on CKD. Course complicated by LLE weakness; negative CVA; MRI showed thoracic disc herniation with cord signal change. S/p T11-T12 transpedicular discectomy 8/16. PMH includes DM2, neuropathy, HTN, HLD, CKD, L foot fx (08/2022).   Clinical Impression  Pt seen for PT Re-evaluation now s/p thoracic transpedicular discectomy 05/27/23. PTA, pt mod indep household ambulator with RW, lives alone, daughter assists with iADLs as needed. Today, pt requiring totalA for bed mobility and repositioning, crying in anticipation of pain with all movement. Pt notes slight improvement in LLE symptoms post-op, though LLE AROM limited by back pain; pt had not asked for pain meds this AM, educ on importance of pain control to allow for activity progression. Further educ re: precautions, positioning, activity recommendations, importance of mobility. Pt would benefit from post-acute rehab services (<3 hrs/day) to maximize functional mobility and independence prior to return home. Will follow acutely to address established goals.    If plan is discharge home, recommend the following: Two people to help with walking and/or transfers;Two people to help with bathing/dressing/bathroom   Can travel by private vehicle   No    Equipment Recommendations Other (comment) (defer to next venue)  Recommendations for Other Services       Functional Status Assessment Patient has had a recent decline in their functional status and demonstrates the ability to make significant improvements in function in a reasonable and predictable amount of time.     Precautions / Restrictions Precautions Precautions: Fall;Back Restrictions Weight Bearing Restrictions: No       Mobility  Bed Mobility Overal bed mobility: Needs Assistance Bed Mobility: Rolling Rolling: Max assist, Used rails, Total assist         General bed mobility comments: pt crying with anticipation of pain with any bed mobility, maxA for LLE repositioning and maxA for attempts to reposition trunk; pt with very poor tolerance for lowering BLEs or elevating HOB; attempts to initiate rolling but pt yelling in pain before starting to move, declines further attempts    Transfers                        Ambulation/Gait                  Stairs            Wheelchair Mobility     Tilt Bed    Modified Rankin (Stroke Patients Only)       Balance                                             Pertinent Vitals/Pain Pain Assessment Pain Assessment: Faces Faces Pain Scale: Hurts worst Pain Location: mid-back Pain Descriptors / Indicators: Discomfort, Grimacing, Guarding, Crying, Moaning Pain Intervention(s): Limited activity within patient's tolerance, Monitored during session, Patient requesting pain meds-RN notified, Repositioned    Home Living Family/patient expects to be discharged to:: Private residence Living Arrangements: Alone Available Help at Discharge: Available PRN/intermittently Type of Home: House Home Access: Ramped entrance       Home Layout: One level Home Equipment: Rollator (4 wheels);Tub bench;Grab bars - tub/shower Additional Comments: reports rollator >5  years old and does not work well    Prior Function Prior Level of Function : Needs assist             Mobility Comments: typically mod indep household ambulator with rollator ADLs Comments: mod indep with ADLs; daughter assists with iADLs as needed. pt drives, manages her own medications     Extremity/Trunk Assessment   Upper Extremity Assessment Upper Extremity Assessment: Generalized weakness    Lower Extremity Assessment Lower Extremity  Assessment: RLE deficits/detail;LLE deficits/detail RLE Deficits / Details: gross strength testing supine ankle >/ 4/5, knee flex/ext >3/5 (can perform SAQ and partial heel slide; limited by pain with hip flex); hip </ 3/5 though significantly limited by c/o back pain RLE: Unable to fully assess due to pain LLE Deficits / Details: gross strength testing supine ankle >/ 4/5, knee flex/ext >3/5 (can perform SAQ; limited by pain with hip flex); hip </ 3/5 though significantly limited by c/o back pain LLE: Unable to fully assess due to pain       Communication      Cognition Arousal: Alert Behavior During Therapy: Flat affect Overall Cognitive Status: No family/caregiver present to determine baseline cognitive functioning                                 General Comments: WFL for simple tasks, not formally assessed. slow to respond at times, difficult to determine true cognitive impairment vs distracted by pain vs motivation        General Comments General comments (skin integrity, edema, etc.): reviewed educ re: back precautions, positioning, activity recmomendations and importance of mobility. pt crying with anticipation of pain with any bed mobility or repositioning; had not asked for anything for pain this AM - RN present during session and pt encouraged to take pain medicine if limiting mobility this much. ultimately unable to progress to EOB this session    Exercises     Assessment/Plan    PT Assessment Patient needs continued PT services  PT Problem List Decreased strength;Pain;Decreased range of motion;Decreased activity tolerance;Decreased balance;Decreased mobility;Decreased knowledge of precautions;Decreased knowledge of use of DME;Decreased cognition;Obesity       PT Treatment Interventions DME instruction;Therapeutic exercise;Gait training;Balance training;Stair training;Functional mobility training;Therapeutic activities;Patient/family education;Wheelchair  mobility training;Neuromuscular re-education    PT Goals (Current goals can be found in the Care Plan section)  Acute Rehab PT Goals Patient Stated Goal: decreased pain PT Goal Formulation: With patient Time For Goal Achievement: 06/11/23 Potential to Achieve Goals: Fair    Frequency Min 1X/week     Co-evaluation               AM-PAC PT "6 Clicks" Mobility  Outcome Measure Help needed turning from your back to your side while in a flat bed without using bedrails?: Total Help needed moving from lying on your back to sitting on the side of a flat bed without using bedrails?: Total Help needed moving to and from a bed to a chair (including a wheelchair)?: Total Help needed standing up from a chair using your arms (e.g., wheelchair or bedside chair)?: Total Help needed to walk in hospital room?: Total Help needed climbing 3-5 steps with a railing? : Total 6 Click Score: 6    End of Session   Activity Tolerance: Patient limited by pain Patient left: in bed;with call bell/phone within reach;with nursing/sitter in room;with bed alarm set Nurse Communication: Mobility status PT Visit Diagnosis: Other  abnormalities of gait and mobility (R26.89);Muscle weakness (generalized) (M62.81);Pain    Time: 1111-1130 PT Time Calculation (min) (ACUTE ONLY): 19 min   Charges:   PT Evaluation $PT Re-evaluation: 1 Re-eval   PT General Charges $$ ACUTE PT VISIT: 1 Visit       Ina Homes, PT, DPT Acute Rehabilitation Services  Personal: Secure Chat Rehab Office: [phone]  Malachy Chamber 05/28/2023, 2:09 PM

## 2023-05-29 ENCOUNTER — Encounter (HOSPITAL_COMMUNITY): Payer: Self-pay | Admitting: Neurological Surgery

## 2023-05-29 DIAGNOSIS — E875 Hyperkalemia: Secondary | ICD-10-CM | POA: Insufficient documentation

## 2023-05-29 LAB — BASIC METABOLIC PANEL
Anion gap: 9 (ref 5–15)
BUN: 34 mg/dL — ABNORMAL HIGH (ref 8–23)
CO2: 25 mmol/L (ref 22–32)
Calcium: 9.1 mg/dL (ref 8.9–10.3)
Chloride: 109 mmol/L (ref 98–111)
Creatinine, Ser: 2.27 mg/dL — ABNORMAL HIGH (ref 0.44–1.00)
GFR, Estimated: 21 mL/min — ABNORMAL LOW (ref >60)
Glucose, Bld: 136 mg/dL — ABNORMAL HIGH (ref 70–99)
Potassium: 5.2 mmol/L — ABNORMAL HIGH (ref 3.5–5.1)
Sodium: 143 mmol/L (ref 135–145)

## 2023-05-29 LAB — GLUCOSE, CAPILLARY
Glucose-Capillary: 130 mg/dL — ABNORMAL HIGH (ref 70–99)
Glucose-Capillary: 138 mg/dL — ABNORMAL HIGH (ref 70–99)
Glucose-Capillary: 126 mg/dL — ABNORMAL HIGH (ref 70–99)
Glucose-Capillary: 154 mg/dL — ABNORMAL HIGH (ref 70–99)

## 2023-05-29 MED ORDER — SODIUM CHLORIDE 0.9 % IV SOLN: INTRAVENOUS | Status: AC

## 2023-05-29 MED ORDER — SODIUM ZIRCONIUM CYCLOSILICATE 10 G PO PACK
10.0000 g | PACK | Freq: Once | ORAL | Status: AC
  Administered 2023-05-29: 10 g via ORAL
  Filled 2023-05-29: qty 1

## 2023-05-29 MED ORDER — HEPARIN SODIUM (PORCINE) 5000 UNIT/ML IJ SOLN
5000.0000 [IU] | Freq: Two times a day (BID) | INTRAMUSCULAR | Status: DC
  Administered 2023-05-29 – 2023-06-09 (×23): 5000 [IU] via SUBCUTANEOUS
  Filled 2023-05-29 (×23): qty 1

## 2023-05-29 MED ORDER — ACETAMINOPHEN 325 MG PO TABS
650.0000 mg | ORAL_TABLET | Freq: Once | ORAL | Status: AC
  Administered 2023-05-29: 650 mg via ORAL
  Filled 2023-05-29: qty 2

## 2023-05-29 NOTE — Progress Notes (Signed)
Bedside nurse reported that patient Tmax is 103 F.  With Tylenol it improved to 102.  Per chart review patient has been admitted for manage mechanical fall, right pneumonolysis and AKI. That then hide temperature patient is hemodynamically stable. - Checking stat CBC, CMP and blood culture.  Check UA and urine culture   Tereasa Coop, MD [facility] 05/29/2023, 10:36 PM

## 2023-05-29 NOTE — Plan of Care (Signed)
  Problem: Education: Goal: Knowledge of General Education information will improve Description: Including pain rating scale, medication(s)/side effects and non-pharmacologic comfort measures Outcome: Progressing   Problem: Health Behavior/Discharge Planning: Goal: Ability to manage health-related needs will improve Outcome: Progressing   Problem: Clinical Measurements: Goal: Ability to maintain clinical measurements within normal limits will improve Outcome: Progressing Goal: Will remain free from infection Outcome: Progressing Goal: Diagnostic test results will improve Outcome: Progressing Goal: Respiratory complications will improve Problem: Activity: Goal: Risk for activity intolerance will decrease Outcome: Progressing   Problem: Nutrition: Goal: Adequate nutrition will be maintained Outcome: Progressing   Problem: Coping: Goal: Level of anxiety will decrease Outcome: Progressing   Outcome: Progressing Goal: Cardiovascular complication will be avoided Outcome: Progressing

## 2023-05-29 NOTE — Plan of Care (Signed)

## 2023-05-29 NOTE — Progress Notes (Signed)
   Providing Compassionate, Quality Care - Together  NEUROSURGERY PROGRESS NOTE   S: No issues overnight.   O: EXAM:  BP (!) 140/60 (BP Location: Right Wrist)   Pulse 80   Temp 100 F (37.8 C) (Oral)   Resp 18   Ht 5\' 10"  (1.778 m)   Wt 126.9 kg   SpO2 94%   BMI 40.14 kg/m   Awake, alert, oriented x 3 PERRL Speech fluent, appropriate  CNs grossly intact  5/5 BUE Right lower extremity 4+/5 throughout, left lower extremity 4/5 throughout Incision clean dry and intact   ASSESSMENT:  82 y.o. female with    Status post T11-12 discectomy   PLAN: -PT/OT -Pain control -Progressing slowly, will likely need rehab    Thank you for allowing me to participate in this patient's care.  Please do not hesitate to call with questions or concerns.   Katherine Pouch, DO Neurosurgeon [facility] Cell: [phone]

## 2023-05-29 NOTE — Progress Notes (Addendum)
Progress Note   Patient: Katherine Cook WUJ:[id number] DOB: 07-02-1941 DOA: 05/20/2023     8 DOS: the patient was seen and examined on 05/29/2023   Brief hospital course: 82yof CKD 3B  b/l creatinine~1.4- 1.5, IDDM T II w/ diabetic neuropathy, hyperlipidemia, essential hypertension presented after fall and was found to have rhabdomyolysis, fever, mild leukocytosis and admitted for further management.   She had initial sensory changes in her left foot back in November after immobilizer boot was placed for traumatic left foot fracture and has progressed to entire leg with worsening LLE weakness after the boot was removed back in March with some improvement. Patient noted to have weakness of all muscle group on left lower extremity>MRI brain no evidence of acute stroke.  Seen by neurology underwent MRI C and T-spine. 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 Secondary to renal dysfunction Give lokelma X1 dose. Continuous IVFs F/u K+ tomorrow   Rhabdomyolysis status post fall and prolonged pressure injury.:  CPK levels trending down, repeat CK 246. 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 Maurice Small on 05/27/23 Pain control as needed Postop care per neurosurgery recommendations-will resume Lovenox later as recommended by neurosurgery As per neurosurgery--will likely need rehab  PT/OT on board  Chest pain: resolved. EKG showed normal sinus rhythm heart rate 76. No  ST and T wave abnormality. Troponin 16. Denies any chest pain. Ruled out ACS.   Essential hypertension: Moderately controlled Continue as needed hydralazine.   Hyperlipidemia Rosuvastatin was held due to rhabdomyolysis. Resume if total CK has normalized.   Insulin dependent type 2 diabetes mellitus (HCC): Hemoglobin A1c 6.4 Carbohydrate modified diet. Discontinued Levemir 10 units twice daily Give sliding scale insulin as needed, follow fingersticks   Leukocytosis: Resolved.   Elevated bilirubin and Transaminitis: Reviewed labs, almost resolved   Enlarged thyroid: Incidental finding on CT. Workup negative. Outpatient follow-up.   Dehydration: Resolved  DVT prophylaxis started on 8/18 according to neurosurgery.     Subjective: Patient reported having pain with minimal movements at the site of surgery on the back.  Denies any chest pain, fever, chills, shortness of breath, chest pain, nausea, vomiting, abdominal pain.   Physical Exam: Vitals:   05/28/23 2106 05/29/23 0531 05/29/23 0558 05/29/23 0825  BP: (!) 145/61 (!) 160/75  (!) 140/60  Pulse: 85 95  80  Resp: 18 18  18   Temp:  99.1 F (37.3 C)  100 F (37.8 C)  TempSrc: Oral Oral  Oral  SpO2: 90% (!) 75% 93% 94%  Weight:   126.9 kg   Height:       Physical Exam Constitutional:      General: She is not in acute distress.    Appearance: Normal appearance. She is obese. She is ill-appearing.  HENT:     Head: Normocephalic.     Comments: Noted contusion near to eyes from recent fall    Nose: Nose normal. No congestion.     Mouth/Throat:     Mouth: Mucous membranes are moist.  Pharynx: Oropharynx is clear.  Eyes:     Extraocular Movements: Extraocular movements intact.     Conjunctiva/sclera: Conjunctivae normal.     Pupils: Pupils are equal, round, and reactive to light.  Cardiovascular:     Rate and Rhythm: Normal rate and regular rhythm.     Pulses: Normal pulses.     Heart sounds: Normal heart sounds.   Pulmonary:     Effort: Pulmonary effort is normal. No respiratory distress.     Breath sounds: Normal breath sounds.  Abdominal:     General: Abdomen is flat. Bowel sounds are normal.     Palpations: Abdomen is soft.  Musculoskeletal:        General: Tenderness present.     Right lower leg: No edema.     Left lower leg: No edema.     Comments: Unable to move her forward because of severe pain on the back  Skin:    General: Skin is warm.     Capillary Refill: Capillary refill takes 2 to 3 seconds.  Neurological:     General: No focal deficit present.     Mental Status: She is alert and oriented to person, place, and time.     Cranial Nerves: No cranial nerve deficit.     Sensory: No sensory deficit.  Psychiatric:        Mood and Affect: Mood normal.        Behavior: Behavior normal.     Data Reviewed:  Latest Reference Range & Units 05/29/23 05:43 05/29/23 08:23  Glucose-Capillary 70 - 99 mg/dL  161 (H)  BASIC METABOLIC PANEL  Rpt !   Sodium 135 - 145 mmol/L 143   Potassium 3.5 - 5.1 mmol/L 5.2 (H)   Chloride 98 - 111 mmol/L 109   CO2 22 - 32 mmol/L 25   Glucose 70 - 99 mg/dL 096 (H)   BUN 8 - 23 mg/dL 34 (H)   Creatinine 0.45 - 1.00 mg/dL 4.09 (H)   Calcium 8.9 - 10.3 mg/dL 9.1   Anion gap 5 - 15  9   GFR, Estimated >60 mL/min 21 (L)   (H): Data is abnormally high !: Data is abnormal (L): Data is abnormally low Rpt: View report in Results Review for more information  Family Communication:   Disposition: Status is: Inpatient   Planned Discharge Destination: TBD    Time spent: 35 minutes  Author: Ernestene Mention, MD 05/29/2023 11:56 AM  For on call review [email or web address].

## 2023-05-30 DIAGNOSIS — E860 Dehydration: Secondary | ICD-10-CM

## 2023-05-30 DIAGNOSIS — M6282 Rhabdomyolysis: Secondary | ICD-10-CM | POA: Diagnosis not present

## 2023-05-30 DIAGNOSIS — N179 Acute kidney failure, unspecified: Secondary | ICD-10-CM | POA: Diagnosis not present

## 2023-05-30 DIAGNOSIS — I10 Essential (primary) hypertension: Secondary | ICD-10-CM | POA: Diagnosis not present

## 2023-05-30 LAB — COMPREHENSIVE METABOLIC PANEL
ALT: 22 U/L (ref 0–44)
AST: 31 U/L (ref 15–41)
Albumin: 2.4 g/dL — ABNORMAL LOW (ref 3.5–5.0)
Alkaline Phosphatase: 73 U/L (ref 38–126)
Anion gap: 10 (ref 5–15)
BUN: 36 mg/dL — ABNORMAL HIGH (ref 8–23)
CO2: 22 mmol/L (ref 22–32)
Calcium: 8.8 mg/dL — ABNORMAL LOW (ref 8.9–10.3)
Chloride: 107 mmol/L (ref 98–111)
Creatinine, Ser: 2.24 mg/dL — ABNORMAL HIGH (ref 0.44–1.00)
GFR, Estimated: 22 mL/min — ABNORMAL LOW (ref >60)
Glucose, Bld: 172 mg/dL — ABNORMAL HIGH (ref 70–99)
Potassium: 4.7 mmol/L (ref 3.5–5.1)
Sodium: 139 mmol/L (ref 135–145)
Total Bilirubin: 1.3 mg/dL — ABNORMAL HIGH (ref 0.3–1.2)
Total Protein: 6.5 g/dL (ref 6.5–8.1)

## 2023-05-30 LAB — URINALYSIS, ROUTINE W REFLEX MICROSCOPIC
Bilirubin Urine: NEGATIVE
Glucose, UA: NEGATIVE mg/dL
Hgb urine dipstick: NEGATIVE
Ketones, ur: 5 mg/dL — AB
Leukocytes,Ua: NEGATIVE
Nitrite: NEGATIVE
Protein, ur: NEGATIVE mg/dL
Specific Gravity, Urine: 1.014 (ref 1.005–1.030)
pH: 5 (ref 5.0–8.0)

## 2023-05-30 LAB — CBC
HCT: 27.9 % — ABNORMAL LOW (ref 36.0–46.0)
Hemoglobin: 9.1 g/dL — ABNORMAL LOW (ref 12.0–15.0)
MCH: 30.7 pg (ref 26.0–34.0)
MCHC: 32.6 g/dL (ref 30.0–36.0)
MCV: 94.3 fL (ref 80.0–100.0)
Platelets: 187 10*3/uL (ref 150–400)
RBC: 2.96 MIL/uL — ABNORMAL LOW (ref 3.87–5.11)
RDW: 14.8 % (ref 11.5–15.5)
WBC: 14.5 10*3/uL — ABNORMAL HIGH (ref 4.0–10.5)
nRBC: 0 % (ref 0.0–0.2)

## 2023-05-30 LAB — GLUCOSE, CAPILLARY
Glucose-Capillary: 116 mg/dL — ABNORMAL HIGH (ref 70–99)
Glucose-Capillary: 162 mg/dL — ABNORMAL HIGH (ref 70–99)
Glucose-Capillary: 139 mg/dL — ABNORMAL HIGH (ref 70–99)
Glucose-Capillary: 134 mg/dL — ABNORMAL HIGH (ref 70–99)

## 2023-05-30 MED ORDER — ROSUVASTATIN CALCIUM 20 MG PO TABS
40.0000 mg | ORAL_TABLET | Freq: Every day | ORAL | Status: DC
  Administered 2023-05-30 – 2023-06-01 (×3): 40 mg via ORAL
  Filled 2023-05-30 (×3): qty 2

## 2023-05-30 NOTE — Evaluation (Signed)
Occupational Therapy Re-Evaluation Patient Details Name: Katherine Cook MRN: [id number] DOB: 17-Feb-1941 Today's Date: 05/30/2023   History of Present Illness 82 y.o. female admitted 05/20/23 after fall at home, down for 10-12 hrs. Pt with rhabdomyolysis, AKI on CKD. Course complicated by LLE weakness; negative CVA; MRI showed thoracic disc herniation with cord signal change. S/p T11-T12 transpedicular discectomy 8/16. PMH includes DM2, neuropathy, HTN, HLD, CKD, L foot fx (08/2022).   Clinical Impression   Katherine Cook was re-evaluated s/p the above back surgery. Pt lives alone and is mod I at baseline, however she has needed significant assist since admission. Upon evaluation the pt was limited by back pain, R lateral lean, impaired cognition, back precautions and poor activity tolerance. On arrival pt was leaning over R armrest of chair, and was unable to self-correct her posture to midline. Pt was dependently lifted via maximove from chair>bed and needed max A +2 for rolling at bed level. Due to the deficits listed below the pt also needs min A for UB ADLs and max-total A for LB ADLs. Pt will benefit from continued acute OT services and skilled inpatient follow up therapy, <3 hours/day.        If plan is discharge home, recommend the following: A lot of help with walking and/or transfers;Two people to help with walking and/or transfers;A lot of help with bathing/dressing/bathroom;Two people to help with bathing/dressing/bathroom;Assistance with cooking/housework;Assistance with feeding;Help with stairs or ramp for entrance;Assist for transportation    Functional Status Assessment  Patient has had a recent decline in their functional status and demonstrates the ability to make significant improvements in function in a reasonable and predictable amount of time.  Equipment Recommendations  None recommended by OT       Precautions / Restrictions Precautions Precautions: Fall;Back Precaution Comments:  verbally reviewed precautions, with pt showing no awareness or recall Restrictions Weight Bearing Restrictions: No Other Position/Activity Restrictions: spinal precautions in room, reviewed.      Mobility Bed Mobility Overal bed mobility: Needs Assistance Bed Mobility: Rolling Rolling: Max assist, +2 for physical assistance, +2 for safety/equipment              Transfers Overall transfer level: Needs assistance Equipment used: Ambulation equipment used Transfers: Bed to chair/wheelchair/BSC             General transfer comment: total A. Transfer via Lift Equipment: Maximove    Balance     Sitting balance-Leahy Scale: Poor Sitting balance - Comments: significant R lateral lean in chair upon arrival, unable to self correct                                   ADL either performed or assessed with clinical judgement   ADL Overall ADL's : Needs assistance/impaired Eating/Feeding: Set up;Sitting   Grooming: Set up;Sitting   Upper Body Bathing: Minimal assistance   Lower Body Bathing: Maximal assistance;Bed level   Upper Body Dressing : Minimal assistance   Lower Body Dressing: Total assistance   Toilet Transfer: Total assistance   Toileting- Clothing Manipulation and Hygiene: Total assistance       Functional mobility during ADLs: Total assistance General ADL Comments: dependently transferred from chair>bed. pt limited by pain and insight     Vision Baseline Vision/History: 1 Wears glasses Patient Visual Report: No change from baseline Vision Assessment?: No apparent visual deficits     Perception Perception: Not tested       Praxis  Praxis: Not tested       Pertinent Vitals/Pain Pain Assessment Pain Assessment: Faces Faces Pain Scale: Hurts whole lot Pain Location: mid-back Pain Descriptors / Indicators: Discomfort, Grimacing, Guarding, Crying, Moaning Pain Intervention(s): Limited activity within patient's tolerance, Monitored  during session     Extremity/Trunk Assessment Upper Extremity Assessment Upper Extremity Assessment: Generalized weakness   Lower Extremity Assessment Lower Extremity Assessment: Defer to PT evaluation   Cervical / Trunk Assessment Cervical / Trunk Assessment: Kyphotic;Other exceptions Cervical / Trunk Exceptions: back sx 8/16. increased body habitus   Communication Communication Communication: Difficulty communicating thoughts/reduced clarity of speech Cueing Techniques: Verbal cues;Tactile cues   Cognition Arousal: Alert Behavior During Therapy: Flat affect Overall Cognitive Status: No family/caregiver present to determine baseline cognitive functioning                                 General Comments: unable to understand pt at times. requires increased time to answer basic questions or follows commands, unable to detail pain location and number. did not recall back precuations     General Comments  reviewed back precuations, pt unable to recall            Home Living Family/patient expects to be discharged to:: Private residence Living Arrangements: Alone Available Help at Discharge: Available PRN/intermittently Type of Home: House Home Access: Ramped entrance     Home Layout: One level     Bathroom Shower/Tub: Chief Strategy Officer: Standard     Home Equipment: Rollator (4 wheels);Tub bench;Grab bars - tub/shower   Additional Comments: reports rollator >38 years old and does not work well      Prior Functioning/Environment Prior Level of Function : Needs assist             Mobility Comments: typically mod indep household ambulator with rollator ADLs Comments: mod indep with ADLs; daughter assists with iADLs as needed. pt drives, manages her own medications        OT Problem List: Decreased strength;Decreased range of motion;Decreased activity tolerance;Impaired balance (sitting and/or standing);Decreased  cognition;Decreased safety awareness;Decreased knowledge of use of DME or AE;Decreased knowledge of precautions;Pain      OT Treatment/Interventions: Self-care/ADL training;Therapeutic exercise;DME and/or AE instruction;Therapeutic activities;Patient/family education;Balance training    OT Goals(Current goals can be found in the care plan section) Acute Rehab OT Goals Patient Stated Goal: less pain OT Goal Formulation: With patient Time For Goal Achievement: 06/18/23 Potential to Achieve Goals: Good  OT Frequency: Min 1X/week       AM-PAC OT "6 Clicks" Daily Activity     Outcome Measure Help from another person eating meals?: A Little Help from another person taking care of personal grooming?: A Little Help from another person toileting, which includes using toliet, bedpan, or urinal?: Total Help from another person bathing (including washing, rinsing, drying)?: A Lot Help from another person to put on and taking off regular upper body clothing?: A Little Help from another person to put on and taking off regular lower body clothing?: Total 6 Click Score: 13   End of Session Nurse Communication: Mobility status  Activity Tolerance: Patient limited by pain Patient left: in bed;with bed alarm set;with call bell/phone within reach  OT Visit Diagnosis: Unsteadiness on feet (R26.81);Other abnormalities of gait and mobility (R26.89);Muscle weakness (generalized) (M62.81);History of falling (Z91.81)                Time: 1005-1030 OT  Time Calculation (min): 25 min Charges:  OT General Charges $OT Visit: 1 Visit OT Evaluation $OT Eval Moderate Complexity: 1 Mod  Derenda Mis, OTR/L Acute Rehabilitation Services Office [phone] Secure Chat Communication Preferred   Donia Pounds 05/30/2023, 10:43 AM

## 2023-05-30 NOTE — Progress Notes (Signed)
Progress Note   Patient: Katherine Cook ZOX:[id number] DOB: Jun 07, 1941 DOA: 05/20/2023     9 DOS: the patient was seen and examined on 05/30/2023   Brief hospital course: 82yof CKD 3B  b/l creatinine~1.4- 1.5, IDDM T II w/ diabetic neuropathy, hyperlipidemia, essential hypertension presented after fall and was found to have rhabdomyolysis, fever, mild leukocytosis and admitted for further management.   She had initial sensory changes in her left foot back in November after immobilizer boot was placed for traumatic left foot fracture and has progressed to entire leg with worsening LLE weakness after the boot was removed back in March with some improvement. Patient noted to have weakness of all muscle group on left lower extremity>MRI brain no evidence of acute stroke.  Seen by neurology underwent MRI C and T-spine. 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  8/19: S/p transpedicular thoracic discectomy on 05/27/2023. Overnight had 1 episode of fever which resolved with Tylenol, no real recurrence.  CBC with some improvement in leukocytosis, blood cultures were obtained.  Assessment and Plan:  Acute kidney injury superimposed on chronic kidney disease stage 3A: Creatinine level has been gradually trending up 1.6--> 1.75--> 1.94--> 2.27 > 2.24 Encourage oral fluid intake Avoid nephrotoxic agents, follow-up urine output  Mild Hyperkalemia: Resolved with Lokelma -Continue to monitor  Rhabdomyolysis status post fall and prolonged pressure injury.:  CPK levels trending down, repeat CK 246. 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 Maurice Small on 05/27/23 Pain control as needed Postop care per neurosurgery recommendations-will resume Lovenox later as recommended by neurosurgery As per neurosurgery--will likely need rehab   PT/OT on board  Chest pain: resolved. EKG showed normal sinus rhythm heart rate 76. No ST and T wave abnormality. Troponin 16. Denies any chest pain. Ruled out ACS.   Essential hypertension: Moderately controlled Continue as needed hydralazine.   Hyperlipidemia Rosuvastatin was initially held due to rhabdomyolysis. -Resume home statin   Insulin dependent type 2 diabetes mellitus (HCC): Hemoglobin A1c 6.4 Carbohydrate modified diet. Discontinued Levemir 10 units twice daily Give sliding scale insulin as needed, follow fingersticks   Leukocytosis: Resolved.   Elevated bilirubin and Transaminitis: Reviewed labs, almost resolved   Enlarged thyroid: Incidental finding on CT. Workup negative. Outpatient follow-up.   Dehydration: Resolved  DVT prophylaxis started on 8/18 according to neurosurgery.    Subjective: Patient was complaining of 9/10 back pain.  Did not had any more fever.  Physical Exam: Vitals:   05/30/23 0538 05/30/23 0828 05/30/23 1029 05/30/23 1147  BP: 139/71 136/69  (!) 117/56  Pulse: 73 69  66  Resp: 18 19  20   Temp: 98.2 F (36.8 C) 98.2 F (36.8 C)  98.2 F (36.8 C)  TempSrc: Oral Oral  Oral  SpO2: 96% 97% 98% 93%  Weight: 128.3 kg     Height:       General.  Obese elderly lady, in no acute distress. Pulmonary.  Lungs clear bilaterally, normal respiratory effort. CV.  Regular rate and rhythm, no JVD, rub or murmur. Abdomen.  Soft, nontender, nondistended, BS positive. CNS.  Alert and oriented .  No focal neurologic deficit. Extremities.  No edema, no cyanosis, pulses intact and symmetrical. Psychiatry.  Appears to have some cognitive impairment.  Data Reviewed: Prior data reviewed   Family Communication: No family at bedside  Disposition: Status is: Inpatient  Planned Discharge Destination: TBD  Time spent: 45 minutes  This record has been created using Conservation officer, historic buildings. Errors have been sought and corrected,but may  not always be located. Such creation errors do not reflect on the standard of care.   Author: Arnetha Courser, MD 05/30/2023 3:18 PM  For on call review [email or web address].

## 2023-05-30 NOTE — Plan of Care (Signed)

## 2023-05-30 NOTE — Progress Notes (Signed)
Physical Therapy Treatment Patient Details Name: Katherine Cook MRN: [id number] DOB: Jan 15, 1941 Today's Date: 05/30/2023   History of Present Illness 82 y.o. female admitted 05/20/23 after fall at home, down for 10-12 hrs. Pt with rhabdomyolysis, AKI on CKD. Course complicated by LLE weakness; negative CVA; MRI showed thoracic disc herniation with cord signal change. S/p T11-T12 transpedicular discectomy 8/16. PMH includes DM2, neuropathy, HTN, HLD, CKD, L foot fx (08/2022).    PT Comments  PT semisidelying on arrival with progression to sitting EOB with max assist but pt remains unable to stand. Maximove for OOB to chair this session with pt educated for all back precautions, transfers and progressive mobility. Pt with limited tolerance for movement and apparent limited effort to assist with transitions. Will continue to follow with inpatient follow up therapy, <3 hours/day appropriate.   If plan is discharge home, recommend the following: Two people to help with walking and/or transfers;Two people to help with bathing/dressing/bathroom   Can travel by private vehicle     No  Equipment Recommendations  Wheelchair (measurements PT);Wheelchair cushion (measurements PT);Hospital bed    Recommendations for Other Services       Precautions / Restrictions Precautions Precautions: Fall;Back Precaution Comments: verbally reviewed precautions, with pt showing no awareness or recall Restrictions Weight Bearing Restrictions: No     Mobility  Bed Mobility Overal bed mobility: Needs Assistance Bed Mobility: Rolling, Sidelying to Sit Rolling: Max assist, Used rails Sidelying to sit: Max assist, HOB elevated, Used rails       General bed mobility comments: max assist to roll fully to right with cues for sequence and assist of pad. Max assist to clear legs and elevate trunk from side with HOB 20 degrees and max cues. Mod assist with increased time to scoot to EOB    Transfers Overall transfer  level: Needs assistance   Transfers: Sit to/from Stand, Bed to chair/wheelchair/BSC Sit to Stand: Max assist, From elevated surface           General transfer comment: with 3 attempts to stand, bed with progressive elevation attempted to stand to stedy with +2 assist with pad under sacrum and cues for rocking momentum to stand pt with anterior trunk translation but no significant effort to engage LB to rise from surface. maximove pad placed in sitting and pt lifted OOB to chair Transfer via Lift Equipment: Maximove  Ambulation/Gait               General Gait Details: unable   Stairs             Wheelchair Mobility     Tilt Bed    Modified Rankin (Stroke Patients Only)       Balance Overall balance assessment: Needs assistance   Sitting balance-Leahy Scale: Poor Sitting balance - Comments: EOB with contact guard, tendency for posterior lean with cues and tactile assist to maintain upright                                    Cognition Arousal: Alert Behavior During Therapy: Flat affect Overall Cognitive Status: No family/caregiver present to determine baseline cognitive functioning                                 General Comments: pt with delayed response stating "wait a minute". reports pain but inconsistent, decreased recall of precautions. self-limiting  Exercises      General Comments        Pertinent Vitals/Pain Pain Assessment Pain Score: 6  Pain Location: mid-back Pain Descriptors / Indicators: Discomfort, Grimacing, Guarding, Crying, Moaning Pain Intervention(s): Limited activity within patient's tolerance, Repositioned, RN gave pain meds during session    Home Living                          Prior Function            PT Goals (current goals can now be found in the care plan section) Progress towards PT goals: Progressing toward goals    Frequency    Min 1X/week      PT Plan       Co-evaluation              AM-PAC PT "6 Clicks" Mobility   Outcome Measure  Help needed turning from your back to your side while in a flat bed without using bedrails?: Total Help needed moving from lying on your back to sitting on the side of a flat bed without using bedrails?: Total Help needed moving to and from a bed to a chair (including a wheelchair)?: Total Help needed standing up from a chair using your arms (e.g., wheelchair or bedside chair)?: Total Help needed to walk in hospital room?: Total Help needed climbing 3-5 steps with a railing? : Total 6 Click Score: 6    End of Session Equipment Utilized During Treatment: Gait belt Activity Tolerance: Patient limited by pain Patient left: in chair;with call bell/phone within reach;with chair alarm set Nurse Communication: Mobility status;Need for lift equipment PT Visit Diagnosis: Other abnormalities of gait and mobility (R26.89);Muscle weakness (generalized) (M62.81)     Time: 1610-9604 PT Time Calculation (min) (ACUTE ONLY): 28 min  Charges:    $Therapeutic Activity: 23-37 mins PT General Charges $$ ACUTE PT VISIT: 1 Visit                     Katherine Cook, PT Acute Rehabilitation Services Office: [phone]    Katherine Cook 05/30/2023, 10:34 AM

## 2023-05-31 DIAGNOSIS — M6282 Rhabdomyolysis: Secondary | ICD-10-CM | POA: Diagnosis not present

## 2023-05-31 DIAGNOSIS — E860 Dehydration: Secondary | ICD-10-CM | POA: Diagnosis not present

## 2023-05-31 DIAGNOSIS — I10 Essential (primary) hypertension: Secondary | ICD-10-CM | POA: Diagnosis not present

## 2023-05-31 DIAGNOSIS — N179 Acute kidney failure, unspecified: Secondary | ICD-10-CM | POA: Diagnosis not present

## 2023-05-31 LAB — GLUCOSE, CAPILLARY
Glucose-Capillary: 136 mg/dL — ABNORMAL HIGH (ref 70–99)
Glucose-Capillary: 144 mg/dL — ABNORMAL HIGH (ref 70–99)
Glucose-Capillary: 170 mg/dL — ABNORMAL HIGH (ref 70–99)
Glucose-Capillary: 170 mg/dL — ABNORMAL HIGH (ref 70–99)

## 2023-05-31 LAB — TYPE AND SCREEN
ABO/RH(D): A POS
Antibody Screen: POSITIVE
Donor AG Type: NEGATIVE
PT AG Type: NEGATIVE
Unit division: 0

## 2023-05-31 LAB — BPAM RBC
Blood Product Expiration Date: 202408272359
Unit Type and Rh: 5100

## 2023-05-31 MED ORDER — POLYETHYLENE GLYCOL 3350 17 G PO PACK
17.0000 g | PACK | Freq: Every day | ORAL | Status: DC
  Administered 2023-05-31 – 2023-06-09 (×10): 17 g via ORAL
  Filled 2023-05-31 (×10): qty 1

## 2023-05-31 NOTE — Plan of Care (Signed)

## 2023-05-31 NOTE — TOC Progression Note (Addendum)
Transition of Care [facility]) - Progression Note    Patient Details  Name: Katherine Cook MRN: [id number] Date of Birth: 1941-09-04  Transition [facility]) CM/SW Contact  [name] A [place], [place] Phone Number: 05/31/2023, 11:50 AM  Clinical Narrative:     Update 1655 Pt's auth approved 8/21 - 8/23 NRD 8/23 Vesta Mixer ID [id number].  Provider notified. Possible DC tomorrow.     CSW notified by provider pt is medically stable. CSW reached out to [facility] regarding bed availability. Bed available for tomorrow. CSW to start authorization for insurance.   TOC will continue to follow.    Expected Discharge Plan: Skilled Nursing Facility Barriers to Discharge: Continued Medical Work up  Expected Discharge Plan and Services In-house Referral: Clinical Social Work     Living arrangements for the past 2 months: Single Family Home                                       Social Determinants of Health (SDOH) Interventions SDOH Screenings   Food Insecurity: No Food Insecurity (05/21/2023)  Housing: Low Risk  (05/21/2023)  Transportation Needs: No Transportation Needs (05/21/2023)  Utilities: Not At Risk (05/21/2023)  Tobacco Use: Low Risk  (05/27/2023)    Readmission Risk Interventions     No data to display

## 2023-05-31 NOTE — Progress Notes (Addendum)
Progress Note   Patient: Katherine Cook JWJ:[id number] DOB: Oct 01, 1941 DOA: 05/20/2023     10 DOS: the patient was seen and examined on 05/31/2023   Brief hospital course: 82yof CKD 3B  b/l creatinine~1.4- 1.5, IDDM T II w/ diabetic neuropathy, hyperlipidemia, essential hypertension presented after fall and was found to have rhabdomyolysis, fever, mild leukocytosis and admitted for further management.   She had initial sensory changes in her left foot back in November after immobilizer boot was placed for traumatic left foot fracture and has progressed to entire leg with worsening LLE weakness after the boot was removed back in March with some improvement. Patient noted to have weakness of all muscle group on left lower extremity>MRI brain no evidence of acute stroke.  Seen by neurology underwent MRI C and T-spine. 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  8/19: S/p transpedicular thoracic discectomy on 05/27/2023. Overnight had 1 episode of fever which resolved with Tylenol, no real recurrence.  CBC with some improvement in leukocytosis, blood cultures were obtained.  8/20: Remained afebrile.  Some improvement in pain.  Had a bed offer-pending insurance authorization for rehab.  Assessment and Plan:  Acute kidney injury superimposed on chronic kidney disease stage 3A: Creatinine level 1.6--> 1.75--> 1.94--> 2.27 > 2.24 Encourage oral fluid intake Avoid nephrotoxic agents, follow-up urine output  Mild Hyperkalemia: Resolved with Lokelma -Continue to monitor  Rhabdomyolysis status post fall and prolonged pressure injury.:  CPK levels trending down, repeat CK 246. 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 Maurice Small on 05/27/23 Pain control as needed Postop care per neurosurgery recommendations-will resume  Lovenox later as recommended by neurosurgery As per neurosurgery--will likely need rehab  PT/OT on board-recommending SNF  Chest pain: resolved. EKG showed normal sinus rhythm heart rate 76. No ST and T wave abnormality. Troponin 16. Denies any chest pain. Ruled out ACS.   Essential hypertension: Moderately controlled Continue as needed hydralazine.   Hyperlipidemia Rosuvastatin was initially held due to rhabdomyolysis. -Resume home statin   Insulin dependent type 2 diabetes mellitus (HCC): Hemoglobin A1c 6.4 Carbohydrate modified diet. Discontinued Levemir 10 units twice daily Give sliding scale insulin as needed, follow fingersticks   Leukocytosis: Resolved.   Elevated bilirubin and Transaminitis: Reviewed labs, almost resolved   Enlarged thyroid: Incidental finding on CT. Workup negative. Outpatient follow-up.   Dehydration: Resolved  DVT prophylaxis started on 8/18 according to neurosurgery.    Subjective: Patient was having 5/10 pain today.  Overall feeling weak  Physical Exam: Vitals:   05/30/23 1147 05/30/23 1647 05/30/23 2014 05/31/23 0745  BP: (!) 117/56 (!) 134/94 (!) 143/90 (!) 125/58  Pulse: 66 87 93 78  Resp: 20 18 18 18   Temp: 98.2 F (36.8 C) 98.4 F (36.9 C) 98.3 F (36.8 C) 99.7 F (37.6 C)  TempSrc: Oral Oral Oral Oral  SpO2: 93% 95% 94% 94%  Weight:      Height:       General.  Chronically ill appearing, obese elderly lady, in no acute distress. Pulmonary.  Lungs clear bilaterally, normal respiratory effort. CV.  Regular rate and rhythm, no JVD, rub or murmur. Abdomen.  Soft, nontender, nondistended, BS positive. CNS.  Alert and oriented .  No focal neurologic deficit. Extremities.  No edema, no cyanosis, pulses intact and symmetrical. Psychiatry.  Appears to have some cognitive impairment  Data Reviewed:  Prior data reviewed   Family Communication: No family at bedside  Disposition: Status is: Inpatient   Planned Discharge  Destination: SNF-pending insurance authorization  Time spent: 40 minutes  This record has been created using Conservation officer, historic buildings. Errors have been sought and corrected,but may not always be located. Such creation errors do not reflect on the standard of care.   Author: Arnetha Courser, MD 05/31/2023 12:30 PM  For on call review [email or web address].

## 2023-06-01 DIAGNOSIS — E875 Hyperkalemia: Secondary | ICD-10-CM

## 2023-06-01 DIAGNOSIS — W19XXXA Unspecified fall, initial encounter: Secondary | ICD-10-CM | POA: Diagnosis not present

## 2023-06-01 DIAGNOSIS — N179 Acute kidney failure, unspecified: Secondary | ICD-10-CM | POA: Diagnosis not present

## 2023-06-01 DIAGNOSIS — R7989 Other specified abnormal findings of blood chemistry: Secondary | ICD-10-CM | POA: Diagnosis not present

## 2023-06-01 DIAGNOSIS — M6282 Rhabdomyolysis: Secondary | ICD-10-CM | POA: Diagnosis not present

## 2023-06-01 LAB — CBC WITH DIFFERENTIAL/PLATELET
Abs Immature Granulocytes: 0.04 10*3/uL (ref 0.00–0.07)
Basophils Absolute: 0.1 10*3/uL (ref 0.0–0.1)
Basophils Relative: 0 %
Eosinophils Absolute: 0.5 10*3/uL (ref 0.0–0.5)
Eosinophils Relative: 4 %
HCT: 25.2 % — ABNORMAL LOW (ref 36.0–46.0)
Hemoglobin: 8.2 g/dL — ABNORMAL LOW (ref 12.0–15.0)
Immature Granulocytes: 0 %
Lymphocytes Relative: 14 %
Lymphs Abs: 1.7 10*3/uL (ref 0.7–4.0)
MCH: 29.8 pg (ref 26.0–34.0)
MCHC: 32.5 g/dL (ref 30.0–36.0)
MCV: 91.6 fL (ref 80.0–100.0)
Monocytes Absolute: 1 10*3/uL (ref 0.1–1.0)
Monocytes Relative: 8 %
Neutro Abs: 8.7 10*3/uL — ABNORMAL HIGH (ref 1.7–7.7)
Neutrophils Relative %: 74 %
Platelets: 232 10*3/uL (ref 150–400)
RBC: 2.75 MIL/uL — ABNORMAL LOW (ref 3.87–5.11)
RDW: 14.8 % (ref 11.5–15.5)
WBC: 12 10*3/uL — ABNORMAL HIGH (ref 4.0–10.5)
nRBC: 0 % (ref 0.0–0.2)

## 2023-06-01 LAB — COMPREHENSIVE METABOLIC PANEL
ALT: 112 U/L — ABNORMAL HIGH (ref 0–44)
AST: 155 U/L — ABNORMAL HIGH (ref 15–41)
Albumin: 2.1 g/dL — ABNORMAL LOW (ref 3.5–5.0)
Alkaline Phosphatase: 148 U/L — ABNORMAL HIGH (ref 38–126)
Anion gap: 13 (ref 5–15)
BUN: 35 mg/dL — ABNORMAL HIGH (ref 8–23)
CO2: 22 mmol/L (ref 22–32)
Calcium: 9 mg/dL (ref 8.9–10.3)
Chloride: 108 mmol/L (ref 98–111)
Creatinine, Ser: 1.83 mg/dL — ABNORMAL HIGH (ref 0.44–1.00)
GFR, Estimated: 27 mL/min — ABNORMAL LOW (ref >60)
Glucose, Bld: 183 mg/dL — ABNORMAL HIGH (ref 70–99)
Potassium: 4.8 mmol/L (ref 3.5–5.1)
Sodium: 143 mmol/L (ref 135–145)
Total Bilirubin: 0.9 mg/dL (ref 0.3–1.2)
Total Protein: 5.9 g/dL — ABNORMAL LOW (ref 6.5–8.1)

## 2023-06-01 LAB — CBC
HCT: 24.3 % — ABNORMAL LOW (ref 36.0–46.0)
Hemoglobin: 8 g/dL — ABNORMAL LOW (ref 12.0–15.0)
MCH: 29.5 pg (ref 26.0–34.0)
MCHC: 32.9 g/dL (ref 30.0–36.0)
MCV: 89.7 fL (ref 80.0–100.0)
Platelets: 222 10*3/uL (ref 150–400)
RBC: 2.71 MIL/uL — ABNORMAL LOW (ref 3.87–5.11)
RDW: 14.6 % (ref 11.5–15.5)
WBC: 14.4 10*3/uL — ABNORMAL HIGH (ref 4.0–10.5)
nRBC: 0 % (ref 0.0–0.2)

## 2023-06-01 LAB — BASIC METABOLIC PANEL
Anion gap: 7 (ref 5–15)
BUN: 34 mg/dL — ABNORMAL HIGH (ref 8–23)
CO2: 23 mmol/L (ref 22–32)
Calcium: 8.6 mg/dL — ABNORMAL LOW (ref 8.9–10.3)
Chloride: 108 mmol/L (ref 98–111)
Creatinine, Ser: 1.77 mg/dL — ABNORMAL HIGH (ref 0.44–1.00)
GFR, Estimated: 29 mL/min — ABNORMAL LOW (ref >60)
Glucose, Bld: 147 mg/dL — ABNORMAL HIGH (ref 70–99)
Potassium: 4.4 mmol/L (ref 3.5–5.1)
Sodium: 138 mmol/L (ref 135–145)

## 2023-06-01 LAB — MAGNESIUM: Magnesium: 2.3 mg/dL (ref 1.7–2.4)

## 2023-06-01 LAB — GLUCOSE, CAPILLARY
Glucose-Capillary: 125 mg/dL — ABNORMAL HIGH (ref 70–99)
Glucose-Capillary: 153 mg/dL — ABNORMAL HIGH (ref 70–99)
Glucose-Capillary: 161 mg/dL — ABNORMAL HIGH (ref 70–99)
Glucose-Capillary: 183 mg/dL — ABNORMAL HIGH (ref 70–99)

## 2023-06-01 LAB — PHOSPHORUS: Phosphorus: 3.6 mg/dL (ref 2.5–4.6)

## 2023-06-01 NOTE — Progress Notes (Signed)
PROGRESS NOTE    Katherine Cook  QIO:[id number] DOB: [birth year] DOA: 05/20/2023 PCP: Earley Brooke., MD   Brief Narrative:  The patient is an 82 year old morbidly obese African-American female with a past medical history significant for but limited to CKD stage IIIb with a baseline creatinine of 1.4-1.5, insulin-dependent diabetes mellitus type 2 with diabetic neuropathy, hyperlipidemia, essential hypertension as well as other comorbidities who presented with a fall and was almost on the floor for 12 hours and could not get help and subsequently developed some numbness on the left side leg due to lying on it most of the day which has been since was resolved in the ED.  When she arrived to the ED she was febrile 100.8 and labs showed an AKI as well as abnormal LFTs and rhabdomyolysis as well as subsequent leukocytosis.  Imaging was done and she had a head CT, CT C-spine, CT maxillofacial CTA as well as CT of the chest abdomen pelvis which showed no acute finding but did show a thyroid abnormality and ultrasound was recommended.  She was also found to have a nondisplaced femoral neck fracture however a right femoral neck 3 view x-ray was recommended but did not show any fracture.  She subsequently admitted for rhabdomyolysis and AKI on CKD stage IIIb and placed on IV fluids.  Given this she also had noted that she had some cystic changes in the left foot back in November after immobilizer was placed for traumatic left foot fracture and had progressed to the entire leg with worsening and left lower extremity weakness.  MRI was done and showed no evidence of acute stroke but she was seen by neurology underwent MRI of C-spine and T-spine on the MRI thoracic spine showed disc herniation with cord signal changes in the cervical stenosis with cord signal change.  Neurosurgery was consulted and recommended thoracic discectomy.  She underwent T11-T12 transpedicular thoracic discectomy with Dr. Johnsie Cancel on  05/27/2023.  PT OT recommending SNF however her WBC is now remains elevated and will need to ensure a trend back down prior to safe discharging.  Further workup has been done and currently blood cultures are negative and likely reactive.  Anticipating discharging to SNF in next 24 to 48 hours if leukocytosis is improved.  Assessment and Plan:  Acute Kidney Injury superimposed on CKD stage 3b: -BUN/Cr Trend: Recent Labs  Lab 05/25/23 0655 05/26/23 0149 05/27/23 1213 05/28/23 0450 05/29/23 0543 05/30/23 0418 06/01/23 0155  BUN 17 19 24* 33* 34* 36* 34*  CREATININE 1.61* 1.60* 1.75* 1.94* 2.27* 2.24* 1.77*  -Avoid Nephrotoxic Medications, Contrast Dyes, Hypotension and Dehydration to Ensure Adequate Renal Perfusion and will need to Renally Adjust Meds -Continue to Monitor and Trend Renal Function carefully and repeat CMP in the AM  -Encourage oral fluid intake and continue to Follow Urine output Avoid nephrotoxic agents, follow-up urine output   Mild Hyperkalemia -Resolved with Lokelma -Patient's K+ Level Trend: Recent Labs  Lab 05/25/23 0655 05/26/23 0149 05/27/23 1213 05/28/23 0450 05/29/23 0543 05/30/23 0418 06/01/23 0155  K 4.2 4.5 4.4 4.7 5.2* 4.7 4.4  -Continue to Monitor and Replete as Necessary -Repeat CMP in the AM    Rhabdomyolysis status post fall and prolonged pressure injury. -CPK levels trending down, repeat CK 246.  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 Maurice Small on 05/27/23 -Pain control as needed -Postop care per neurosurgery recommendations-will  resume Lovenox later as recommended by neurosurgery -As per neurosurgery--will likely need rehab  -PT/OT on board and recommending SNF   Chest Pain -Resolved. -EKG showed normal sinus rhythm heart rate 76.  -No ST and T wave abnormality. Troponin 16.  -Denies any chest pain.   -Ruled out ACS.    Essential Hypertension, Moderately controlled -C/w IV Hydralazine 5 mg q6hprn SBP >160 -Continue to Monitor BP per Protocol -Last BP reading was 122/78   Hyperlipidemia -Rosuvastatin was initially held due to rhabdomyolysis and Abnormal LFTs -Resume home statin with Rosuvastatin 40 mg po Daily    Insulin Dependent Type 2 Diabetes Mellitus (HCC) -Hemoglobin A1c 6.4 -C/w Carbohydrate modified diet. -Discontinued Levemir 10 units twice daily -C/w Very Sensitive Novolog SSI AC -Continue to Monitor and Trend CBG's and Glucose Levels: Recent Labs  Lab 05/31/23 0742 05/31/23 1206 05/31/23 1553 05/31/23 2039 06/01/23 0751 06/01/23 1219 06/01/23 1649  GLUCAP 136* 144* 170* 170* 125* 153* 161*   Recent Labs  Lab 05/25/23 0655 05/26/23 0149 05/27/23 1213 05/28/23 0450 05/29/23 0543 05/30/23 0418 06/01/23 0155  GLUCOSE 104* 112* 179* 168* 136* 172* 147*    Leukocytosis -Unclear Etiology but ? Reactive Recent Labs  Lab 05/23/23 0140 05/24/23 0307 05/25/23 0655 05/26/23 0149 05/27/23 1213 05/30/23 0418 06/01/23 0155  WBC 8.6 7.7 9.4 8.4 15.0* 14.5* 14.4*  -U/A Negative  -Blood Cx x2 done and showed NGTD at 2 Days  -Repeat CXR in the AM -Continue to Monitor for S/Sx of Infection -Repeat CBC in the AM   Normocytic Anemia -Hgb/Hct Trend: Recent Labs  Lab 05/23/23 0140 05/24/23 0307 05/25/23 0655 05/26/23 0149 05/27/23 1213 05/30/23 0418 06/01/23 0155  HGB 8.8* 9.3* 9.3* 9.4* 10.2* 9.1* 8.0*  HCT 26.8* 28.0* 28.1* 28.6* 31.3* 27.9* 24.3*  MCV 89.6 89.7 90.1 92.6 91.5 94.3 89.7  -Check Anemia Panel in the AM -Continue to Monitor for S/Sx of Bleeding; No overt bleeding noted -Repeat CBC in the AM    Hyperbilirubinemia  Abnormal LFTs and Elevated LFTs Recent Labs  Lab 05/20/23 2137 05/21/23 0713 05/22/23 0345 05/23/23 0140 05/28/23 0450 05/30/23 0418  AST 71* 71* 62* 44* 30 31  ALT 23 23 23 22 24 22   BILITOT 1.4* 1.2 1.2 0.7 0.7  1.3*  ALKPHOS 34* 30* 32* 30* 34* 73  -Continue to Monitor and Trend and repeat CMP in the AM    Enlarged thyroid -Incidental finding on CT. -Workup negative. - Will need Outpatient follow-up.   Dehydration -Resolved  GERD/GI Prophylaxis -C/w Pantoprazole 40 mg po daily   Hypoalbuminemia -Patient's Albumin Trend: Recent Labs  Lab 05/20/23 2137 05/21/23 0713 05/22/23 0345 05/23/23 0140 05/28/23 0450 05/30/23 0418  ALBUMIN 3.4* 2.9* 2.8* 2.5* 2.7* 2.4*  -Continue to Monitor and Trend and repeat CMP in the AM  Morbid Obesity -Complicates overall prognosis and care -Estimated body mass index is 40.08 kg/m as calculated from the following:   Height as of this encounter: 5\' 10"  (1.778 m).   Weight as of this encounter: 126.7 kg.  -Weight Loss and Dietary Counseling given   DVT prophylaxis: heparin injection 5,000 Units Start: 05/29/23 2200 SCDs Start: 05/21/23 0450 Place TED hose Start: 05/21/23 0450    Code Status: Full Code Family Communication: No family currently at bedside  Disposition Plan:  Level of care: Telemetry Medical Status is: Inpatient Remains inpatient appropriate because: Will need to ensure that her leukocytosis is improved prior to discharging and anticipating discharge to SNF in next 24 to 48 hours  Consultants:  Neurosurgery Neurology Cardiology  Procedures:  As delineated as above  Antimicrobials:  Anti-infectives (From admission, onward)    Start     Dose/Rate Route Frequency Ordered Stop   05/27/23 0720  ceFAZolin (ANCEF) 3-0.9 GM/100ML-% IVPB       Note to Pharmacy: Camillia Herter A: cabinet override      05/27/23 0720 05/27/23 1929   05/22/23 1100  cefTRIAXone (ROCEPHIN) 1 g in sodium chloride 0.9 % 100 mL IVPB        1 g 200 mL/hr over 30 Minutes Intravenous Every 24 hours 05/22/23 1015 05/24/23 1151       Subjective: Seen and examined at bedside and she is resting and watching television.  She continues to have some pain  but is improving.  No lightheadedness or dizziness.  No other concerns or complaints at this time and WBC is slowly trending down.  Objective: Vitals:   06/01/23 0500 06/01/23 0609 06/01/23 0753 06/01/23 1652  BP:  (!) 154/77 (!) 142/69 122/78  Pulse:  74 78 71  Resp:  18 19 18   Temp:  (!) 97.3 F (36.3 C) 99.8 F (37.7 C) 98.2 F (36.8 C)  TempSrc:  Oral Oral Oral  SpO2:  94% 92% 99%  Weight: 126.7 kg     Height:        Intake/Output Summary (Last 24 hours) at 06/01/2023 1724 Last data filed at 06/01/2023 1345 Gross per 24 hour  Intake 236 ml  Output 600 ml  Net -364 ml   Filed Weights   05/29/23 0558 05/30/23 0538 06/01/23 0500  Weight: 126.9 kg 128.3 kg 126.7 kg   Examination: Physical Exam:  Constitutional: WN/WD morbidly obese African-American female who is chronically ill-appearing in no acute distress Respiratory: Diminished to auscultation bilaterally, no wheezing, rales, rhonchi or crackles. Normal respiratory effort and patient is not tachypenic. No accessory muscle use.  Unlabored breathing Cardiovascular: RRR, no murmurs / rubs / gallops. S1 and S2 auscultated.  Trace extremity edema.  Abdomen: Soft, non-tender, distended secondary. Bowel sounds positive.  GU: Deferred. Musculoskeletal: No clubbing / cyanosis of digits/nails. No joint deformity upper and lower extremities.  Skin: No rashes, lesions, ulcers on limited skin evaluation. No induration; Warm and dry.  Neurologic: CN 2-12 grossly intact with no focal deficits. Romberg sign and cerebellar reflexes not assessed.   Data Reviewed: I have personally reviewed following labs and imaging studies  CBC: Recent Labs  Lab 05/26/23 0149 05/27/23 1213 05/30/23 0418 06/01/23 0155 06/01/23 1610  WBC 8.4 15.0* 14.5* 14.4* 12.0*  NEUTROABS  --   --   --   --  8.7*  HGB 9.4* 10.2* 9.1* 8.0* 8.2*  HCT 28.6* 31.3* 27.9* 24.3* 25.2*  MCV 92.6 91.5 94.3 89.7 91.6  PLT 213 235 187 222 232   Basic Metabolic  Panel: Recent Labs  Lab 05/27/23 1213 05/28/23 0450 05/29/23 0543 05/30/23 0418 06/01/23 0155  NA 140 141 143 139 138  K 4.4 4.7 5.2* 4.7 4.4  CL 107 108 109 107 108  CO2 22 22 25 22 23   GLUCOSE 179* 168* 136* 172* 147*  BUN 24* 33* 34* 36* 34*  CREATININE 1.75* 1.94* 2.27* 2.24* 1.77*  CALCIUM 9.1 8.7* 9.1 8.8* 8.6*   GFR: Estimated Creatinine Clearance: 36.1 mL/min (A) (by C-G formula based on SCr of 1.77 mg/dL (H)). Liver Function Tests: Recent Labs  Lab 05/28/23 0450 05/30/23 0418  AST 30 31  ALT 24 22  ALKPHOS 34* 73  BILITOT  0.7 1.3*  PROT 6.2* 6.5  ALBUMIN 2.7* 2.4*   No results for input(s): "LIPASE", "AMYLASE" in the last 168 hours. No results for input(s): "AMMONIA" in the last 168 hours. Coagulation Profile: No results for input(s): "INR", "PROTIME" in the last 168 hours. Cardiac Enzymes: Recent Labs  Lab 05/28/23 0450  CKTOTAL 246*   BNP (last 3 results) No results for input(s): "PROBNP" in the last 8760 hours. HbA1C: No results for input(s): "HGBA1C" in the last 72 hours. CBG: Recent Labs  Lab 05/31/23 1553 05/31/23 2039 06/01/23 0751 06/01/23 1219 06/01/23 1649  GLUCAP 170* 170* 125* 153* 161*   Lipid Profile: No results for input(s): "CHOL", "HDL", "LDLCALC", "TRIG", "CHOLHDL", "LDLDIRECT" in the last 72 hours. Thyroid Function Tests: No results for input(s): "TSH", "T4TOTAL", "FREET4", "T3FREE", "THYROIDAB" in the last 72 hours. Anemia Panel: No results for input(s): "VITAMINB12", "FOLATE", "FERRITIN", "TIBC", "IRON", "RETICCTPCT" in the last 72 hours. Sepsis Labs: No results for input(s): "PROCALCITON", "LATICACIDVEN" in the last 168 hours.  Recent Results (from the past 240 hour(s))  Urine Culture (for pregnant, neutropenic or urologic patients or patients with an indwelling urinary catheter)     Status: Abnormal   Collection Time: 05/22/23 11:52 PM   Specimen: Urine, Clean Catch  Result Value Ref Range Status   Specimen  Description URINE, CLEAN CATCH  Final   Special Requests NONE  Final   Culture (A)  Final    <10,000 COLONIES/mL INSIGNIFICANT GROWTH Performed at [facility], [address] [place], [place] [address]    Report Status 05/24/2023 FINAL  Final  Surgical pcr screen     Status: None   Collection Time: 05/27/23  6:51 AM   Specimen: Nasal Mucosa; Nasal Swab  Result Value Ref Range Status   MRSA, PCR NEGATIVE NEGATIVE Final   Staphylococcus aureus NEGATIVE NEGATIVE Final    Comment: (NOTE) The Xpert SA Assay (FDA approved for NASAL specimens in patients 65 years of age and older), is one component of a comprehensive surveillance program. It is not intended to diagnose infection nor to guide or monitor treatment. Performed at [facility], [address], [place], [place] [address]   Culture, blood (Routine X 2) w Reflex to ID Panel     Status: None (Preliminary result)   Collection Time: 05/30/23  4:18 AM   Specimen: BLOOD RIGHT HAND  Result Value Ref Range Status   Specimen Description BLOOD RIGHT HAND  Final   Special Requests   Final    BOTTLES DRAWN AEROBIC AND ANAEROBIC Blood Culture adequate volume   Culture   Final    NO GROWTH 2 DAYS Performed at [facility], [address], [place], [place] [address]    Report Status PENDING  Incomplete  Culture, blood (Routine X 2) w Reflex to ID Panel     Status: None (Preliminary result)   Collection Time: 05/30/23  4:21 AM   Specimen: BLOOD RIGHT ARM  Result Value Ref Range Status   Specimen Description BLOOD RIGHT ARM  Final   Special Requests   Final    BOTTLES DRAWN AEROBIC AND ANAEROBIC Blood Culture adequate volume   Culture   Final    NO GROWTH 2 DAYS Performed at [facility], [address], [place], [place] [address]    Report Status PENDING  Incomplete    Radiology Studies: No results found.  Scheduled Meds:  ascorbic acid  1,000 mg Oral Daily   aspirin EC  81 mg Oral Daily   donepezil   10 mg Oral Daily   gabapentin  100 mg Oral BID   heparin  5,000 Units Subcutaneous Q12H   insulin aspart  0-6 Units Subcutaneous TID WC   melatonin  3 mg Oral QHS   pantoprazole  40 mg Oral Daily   polyethylene glycol  17 g Oral Daily   rosuvastatin  40 mg Oral Daily   sodium chloride flush  3 mL Intravenous Q12H   Continuous Infusions:  sodium chloride      LOS: 11 days   Marguerita Merles, DO [facility] Available via Epic secure chat 7am-7pm After these hours, please refer to coverage provider listed on [email or web address] 06/01/2023, 5:24 PM

## 2023-06-01 NOTE — Progress Notes (Signed)
Physical Therapy Treatment Patient Details Name: Katherine Cook MRN: [id number] DOB: 1941/07/18 Today's Date: 06/01/2023   History of Present Illness 82 y.o. female admitted 05/20/23 after fall at home, down for 10-12 hrs. Pt with rhabdomyolysis, AKI on CKD. Course complicated by LLE weakness; negative CVA; MRI showed thoracic disc herniation with cord signal change. S/p T11-T12 transpedicular discectomy 8/16. PMH includes DM2, neuropathy, HTN, HLD, CKD, L foot fx (08/2022).    PT Comments  Pt greeted resting in bed and agreeable to session with encouragement as pt endorsing some anxiety toward mobility in anticipation of pain. Pt receptive to education on importance of continued mobility and able to perform bed mobility with max A to maintain back precautions, manage Les and elevate trunk. Pt unable to tolerate transfer trials this session due to pain, needing consistent cues for breathing techniques to slow RR. Pt daughter present and supportive throughout session. Pt continues to benefit from skilled PT services to progress toward functional mobility goals.     If plan is discharge home, recommend the following: Two people to help with walking and/or transfers;Two people to help with bathing/dressing/bathroom   Can travel by private vehicle     No  Equipment Recommendations  Wheelchair (measurements PT);Wheelchair cushion (measurements PT);Hospital bed    Recommendations for Other Services       Precautions / Restrictions Precautions Precautions: Fall;Back Precaution Comments: verbally reviewed precautions, with pt showing no awareness or recall Restrictions Weight Bearing Restrictions: No Other Position/Activity Restrictions: spinal precautions in room, reviewed.     Mobility  Bed Mobility Overal bed mobility: Needs Assistance Bed Mobility: Rolling Rolling: Max assist Sidelying to sit: Max assist, HOB elevated, Used rails     Sit to sidelying: Max assist General bed mobility  comments: max assist to roll fully to right with cues for sequence and assist of pad. Max assist to clear legs and elevate trunk from side with HOB 20 degrees and max cues. pt needing multiple rest breaks in sidelying before coming fully up to sitting    Transfers                   General transfer comment: not attempted this session as pt with max pain and attempting to lie back down    Ambulation/Gait               General Gait Details: unable   Stairs             Wheelchair Mobility     Tilt Bed    Modified Rankin (Stroke Patients Only)       Balance Overall balance assessment: Needs assistance Sitting-balance support: Feet supported Sitting balance-Leahy Scale: Poor Sitting balance - Comments: significant R lateral lean in chair upon arrival, unable to self correct                                    Cognition Arousal: Alert Behavior During Therapy: Flat affect Overall Cognitive Status: No family/caregiver present to determine baseline cognitive functioning                                 General Comments: unable to understand pt at times. requires increased time to answer basic questions or follows commands, did not recall back precuations        Exercises General Exercises - Lower Extremity Ankle Circles/Pumps:  AROM, Right, Left, 10 reps, Supine Heel Slides: AROM, Right, Left, 10 reps, Supine    General Comments General comments (skin integrity, edema, etc.): reviewed back precuations, pt unable to recall      Pertinent Vitals/Pain Pain Assessment Pain Assessment: Faces Faces Pain Scale: Hurts whole lot Pain Location: mid-back Pain Descriptors / Indicators: Discomfort, Grimacing, Guarding, Crying, Moaning Pain Intervention(s): Monitored during session, Limited activity within patient's tolerance, Repositioned    Home Living                          Prior Function            PT Goals  (current goals can now be found in the care plan section) Acute Rehab PT Goals Patient Stated Goal: decreased pain PT Goal Formulation: With patient Time For Goal Achievement: 06/11/23 Progress towards PT goals: Progressing toward goals    Frequency    Min 1X/week      PT Plan      Co-evaluation              AM-PAC PT "6 Clicks" Mobility   Outcome Measure  Help needed turning from your back to your side while in a flat bed without using bedrails?: Total Help needed moving from lying on your back to sitting on the side of a flat bed without using bedrails?: Total Help needed moving to and from a bed to a chair (including a wheelchair)?: Total Help needed standing up from a chair using your arms (e.g., wheelchair or bedside chair)?: Total Help needed to walk in hospital room?: Total Help needed climbing 3-5 steps with a railing? : Total 6 Click Score: 6    End of Session   Activity Tolerance: Patient limited by pain Patient left: with call bell/phone within reach;in bed;with bed alarm set;with family/visitor present Nurse Communication: Mobility status PT Visit Diagnosis: Other abnormalities of gait and mobility (R26.89);Muscle weakness (generalized) (M62.81)     Time: 1610-9604 PT Time Calculation (min) (ACUTE ONLY): 22 min  Charges:    $Therapeutic Activity: 8-22 mins PT General Charges $$ ACUTE PT VISIT: 1 Visit                     Fransheska Willingham R. PTA Acute Rehabilitation Services Office: [phone]   Catalina Antigua 06/01/2023, 3:36 PM

## 2023-06-01 NOTE — Plan of Care (Signed)

## 2023-06-02 ENCOUNTER — Inpatient Hospital Stay (HOSPITAL_COMMUNITY): Payer: Medicare HMO

## 2023-06-02 DIAGNOSIS — N179 Acute kidney failure, unspecified: Secondary | ICD-10-CM | POA: Diagnosis not present

## 2023-06-02 DIAGNOSIS — W19XXXA Unspecified fall, initial encounter: Secondary | ICD-10-CM | POA: Diagnosis not present

## 2023-06-02 DIAGNOSIS — R7989 Other specified abnormal findings of blood chemistry: Secondary | ICD-10-CM | POA: Diagnosis not present

## 2023-06-02 DIAGNOSIS — M6282 Rhabdomyolysis: Secondary | ICD-10-CM | POA: Diagnosis not present

## 2023-06-02 LAB — COMPREHENSIVE METABOLIC PANEL
ALT: 118 U/L — ABNORMAL HIGH (ref 0–44)
AST: 130 U/L — ABNORMAL HIGH (ref 15–41)
Albumin: 2 g/dL — ABNORMAL LOW (ref 3.5–5.0)
Alkaline Phosphatase: 137 U/L — ABNORMAL HIGH (ref 38–126)
Anion gap: 9 (ref 5–15)
BUN: 35 mg/dL — ABNORMAL HIGH (ref 8–23)
CO2: 21 mmol/L — ABNORMAL LOW (ref 22–32)
Calcium: 8.7 mg/dL — ABNORMAL LOW (ref 8.9–10.3)
Chloride: 111 mmol/L (ref 98–111)
Creatinine, Ser: 1.73 mg/dL — ABNORMAL HIGH (ref 0.44–1.00)
GFR, Estimated: 29 mL/min — ABNORMAL LOW (ref >60)
Glucose, Bld: 151 mg/dL — ABNORMAL HIGH (ref 70–99)
Potassium: 4.6 mmol/L (ref 3.5–5.1)
Sodium: 141 mmol/L (ref 135–145)
Total Bilirubin: 0.7 mg/dL (ref 0.3–1.2)
Total Protein: 5.7 g/dL — ABNORMAL LOW (ref 6.5–8.1)

## 2023-06-02 LAB — CBC WITH DIFFERENTIAL/PLATELET
Abs Immature Granulocytes: 0.06 10*3/uL (ref 0.00–0.07)
Basophils Absolute: 0 10*3/uL (ref 0.0–0.1)
Basophils Relative: 0 %
Eosinophils Absolute: 0.6 10*3/uL — ABNORMAL HIGH (ref 0.0–0.5)
Eosinophils Relative: 5 %
HCT: 25.2 % — ABNORMAL LOW (ref 36.0–46.0)
Hemoglobin: 8.2 g/dL — ABNORMAL LOW (ref 12.0–15.0)
Immature Granulocytes: 1 %
Lymphocytes Relative: 17 %
Lymphs Abs: 2 10*3/uL (ref 0.7–4.0)
MCH: 29.8 pg (ref 26.0–34.0)
MCHC: 32.5 g/dL (ref 30.0–36.0)
MCV: 91.6 fL (ref 80.0–100.0)
Monocytes Absolute: 1 10*3/uL (ref 0.1–1.0)
Monocytes Relative: 8 %
Neutro Abs: 8.3 10*3/uL — ABNORMAL HIGH (ref 1.7–7.7)
Neutrophils Relative %: 69 %
Platelets: 242 10*3/uL (ref 150–400)
RBC: 2.75 MIL/uL — ABNORMAL LOW (ref 3.87–5.11)
RDW: 14.9 % (ref 11.5–15.5)
WBC: 11.9 10*3/uL — ABNORMAL HIGH (ref 4.0–10.5)
nRBC: 0 % (ref 0.0–0.2)

## 2023-06-02 LAB — PHOSPHORUS: Phosphorus: 3.6 mg/dL (ref 2.5–4.6)

## 2023-06-02 LAB — HEPATITIS PANEL, ACUTE
HCV Ab: NONREACTIVE
Hep A IgM: NONREACTIVE
Hep B C IgM: NONREACTIVE
Hepatitis B Surface Ag: NONREACTIVE

## 2023-06-02 LAB — GLUCOSE, CAPILLARY
Glucose-Capillary: 131 mg/dL — ABNORMAL HIGH (ref 70–99)
Glucose-Capillary: 199 mg/dL — ABNORMAL HIGH (ref 70–99)
Glucose-Capillary: 141 mg/dL — ABNORMAL HIGH (ref 70–99)
Glucose-Capillary: 162 mg/dL — ABNORMAL HIGH (ref 70–99)

## 2023-06-02 LAB — MAGNESIUM: Magnesium: 2.3 mg/dL (ref 1.7–2.4)

## 2023-06-02 NOTE — Progress Notes (Signed)
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  When she arrived to the ED she was febrile 100.8 and labs showed an AKI as well as abnormal LFTs and rhabdomyolysis as well as subsequent leukocytosis.  Imaging was done and she had a head CT, CT C-spine, CT maxillofacial CTA as well as CT of the chest abdomen pelvis which showed no acute finding but did show a thyroid abnormality and ultrasound was recommended.  She was also found to have a nondisplaced femoral neck fracture however a right femoral neck 3 view x-ray was recommended but did not show any fracture.  She subsequently admitted for rhabdomyolysis and AKI on CKD stage IIIb and placed on IV fluids.  Given this she also had noted that she had some cystic changes in the left foot back in November after immobilizer was placed for traumatic left foot fracture and had progressed to the entire leg with worsening and left lower extremity weakness.  MRI was done and showed no evidence of acute stroke but she was seen by neurology underwent MRI of C-spine and T-spine on the MRI thoracic spine showed disc herniation with cord signal changes in the cervical stenosis with cord signal change.  Neurosurgery was consulted and recommended thoracic discectomy.  She underwent T11-T12 transpedicular thoracic discectomy with Dr. Johnsie Cancel on  05/27/2023.  PT OT recommending SNF however her WBC is now remains elevated and will need to ensure a trend back down prior to safe discharging.  Further workup has been done and currently blood cultures are negative and likely reactive.  LFTs also trended up now again so we will check an acute hepatitis panel and a right quad ultrasound Cysto-Conray be done.  If continues to improve and WBC improves anticipate discharging to SNF in next 24 to 48 hours.  Assessment and Plan:  Acute Kidney Injury superimposed on CKD stage 3b: Metabolic Acidosis -BUN/Cr Trend: Recent Labs  Lab 05/27/23 1213 05/28/23 0450 05/29/23 0543 05/30/23 0418 06/01/23 0155 06/01/23 1610 06/02/23 0022  BUN 24* 33* 34* 36* 34* 35* 35*  CREATININE 1.75* 1.94* 2.27* 2.24* 1.77* 1.83* 1.73*  -Avoid Nephrotoxic Medications, Contrast Dyes, Hypotension and Dehydration to Ensure Adequate Renal Perfusion and will need to Renally Adjust Meds -Patient has a slight metabolic acidosis with a CO2 of 21, anion gap of 9, chloride level of 111 -Continue to Monitor and Trend Renal Function carefully and repeat CMP in the AM  -Encourage oral fluid intake and continue to Follow Urine output Avoid nephrotoxic agents, follow-up urine output   Mild Hyperkalemia, improved -Resolved with Lokelma -Patient's K+ Level Trend: Recent Labs  Lab 05/27/23 1213 05/28/23 0450 05/29/23 0543 05/30/23 0418 06/01/23 0155 06/01/23 1610 06/02/23 0022  K 4.4 4.7 5.2* 4.7 4.4 4.8 4.6  -Continue to Monitor and Replete as Necessary -Repeat CMP in the AM    Rhabdomyolysis status post fall and prolonged  pressure injury. -CPK levels trending down, repeat CK 246.  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 Maurice Small on 05/27/23 -Pain control as needed -Postop care per neurosurgery recommendations-will resume Lovenox later as  recommended by neurosurgery -As per neurosurgery--will likely need rehab  -PT/OT on board and recommending SNF and will discharge once she is further medically stable   Chest Pain -Resolved. -EKG showed normal sinus rhythm heart rate 76.  -No ST and T wave abnormality. Troponin 16.  -Denies any chest pain.  -Ruled out ACS.    Essential Hypertension, Moderately controlled -C/w IV Hydralazine 5 mg q6hprn SBP >160 -Continue to Monitor BP per Protocol -Last BP reading was elevated at 158/92   Hyperlipidemia -Rosuvastatin was initially held due to rhabdomyolysis and Abnormal LFTs -Resumed home statin with Rosuvastatin 40 mg po Daily but holding again given Abnormal LFTs   Insulin Dependent Type 2 Diabetes Mellitus (HCC) -Hemoglobin A1c 6.4 -C/w Carbohydrate modified diet. -Discontinued Levemir 10 units twice daily -C/w Very Sensitive Novolog SSI AC -Continue to Monitor and Trend CBG's and Glucose Levels: Recent Labs  Lab 05/31/23 2039 06/01/23 0751 06/01/23 1219 06/01/23 1649 06/01/23 2040 06/02/23 0746 06/02/23 1206  GLUCAP 170* 125* 153* 161* 183* 131* 199*   Recent Labs  Lab 05/27/23 1213 05/28/23 0450 05/29/23 0543 05/30/23 0418 06/01/23 0155 06/01/23 1610 06/02/23 0022  GLUCOSE 179* 168* 136* 172* 147* 183* 151*    Leukocytosis -Unclear Etiology but ? Reactive and trending down Recent Labs  Lab 05/25/23 0655 05/26/23 0149 05/27/23 1213 05/30/23 0418 06/01/23 0155 06/01/23 1610 06/02/23 0022  WBC 9.4 8.4 15.0* 14.5* 14.4* 12.0* 11.9*  -U/A Negative  -Blood Cx x2 done and showed NGTD at 2 Days  -Repeat CXR in the AM -Continue to Monitor for S/Sx of Infection -Repeat CBC in the AM and anticipate further improvement  Normocytic Anemia -Hgb/Hct Trend: Recent Labs  Lab 05/25/23 0655 05/26/23 0149 05/27/23 1213 05/30/23 0418 06/01/23 0155 06/01/23 1610 06/02/23 0022  HGB 9.3* 9.4* 10.2* 9.1* 8.0* 8.2* 8.2*  HCT 28.1* 28.6* 31.3* 27.9* 24.3* 25.2*  25.2*  MCV 90.1 92.6 91.5 94.3 89.7 91.6 91.6  -Check Anemia Panel in the AM -Continue to Monitor for S/Sx of Bleeding; No overt bleeding noted -Repeat CBC in the AM    Hyperbilirubinemia  Abnormal LFTs and Elevated LFTs Recent Labs  Lab 05/21/23 0713 05/22/23 0345 05/23/23 0140 05/28/23 0450 05/30/23 0418 06/01/23 1610 06/02/23 0022  AST 71* 62* 44* 30 31 155* 130*  ALT 23 23 22 24 22  112* 118*  BILITOT 1.2 1.2 0.7 0.7 1.3* 0.9 0.7  ALKPHOS 30* 32* 30* 34* 73 148* 137*  -Will check RUQ U/S and Acute Hepatitis Panel was negative -Continue to Monitor and Trend and repeat CMP in the AM    Enlarged Thyroid -Incidental finding on CT. -Workup negative with Thyroid U/S and showed "Enlarged heterogeneous thyroid without discrete nodule." -Will need Outpatient follow-up with Endocrinology    Dehydration -Resolved  GERD/GI Prophylaxis -C/w Pantoprazole 40 mg po daily   Hypoalbuminemia -Patient's Albumin Trend: Recent Labs  Lab 05/21/23 0713 05/22/23 0345 05/23/23 0140 05/28/23 0450 05/30/23 0418 06/01/23 1610 06/02/23 0022  ALBUMIN 2.9* 2.8* 2.5* 2.7* 2.4* 2.1* 2.0*  -Continue to Monitor and Trend and repeat CMP in the AM  Obesity -Complicates overall prognosis and care -Estimated body mass index is 39.83 kg/m as calculated from the following:  Height as of this encounter: 5\' 10"  (1.778 m).   Weight as of this encounter: 125.9 kg.  -Weight Loss and Dietary Counseling given   DVT prophylaxis: heparin injection 5,000 Units Start: 05/29/23 2200 SCDs Start: 05/21/23 0450 Place TED hose Start: 05/21/23 0450    Code Status: Full Code Family Communication: No family currently at bedside  Disposition Plan:  Level of care: Telemetry Medical Status is: Inpatient Remains inpatient appropriate because: Needs further clinical improvement and improvement in LFTs as well as WBC to safe discharge to SNF in next 24 to 48 hours   Consultants:   Neurosurgery Neurology Cardiology  Procedures:  As delineated as above   Antimicrobials:  Anti-infectives (From admission, onward)    Start     Dose/Rate Route Frequency Ordered Stop   05/27/23 0720  ceFAZolin (ANCEF) 3-0.9 GM/100ML-% IVPB       Note to Pharmacy: Camillia Herter A: cabinet override      05/27/23 0720 05/27/23 1929   05/22/23 1100  cefTRIAXone (ROCEPHIN) 1 g in sodium chloride 0.9 % 100 mL IVPB        1 g 200 mL/hr over 30 Minutes Intravenous Every 24 hours 05/22/23 1015 05/24/23 1151       Subjective: Seen and examined at bedside and was complaining of being "sore".  No nausea or vomiting.  No lightheadedness or dizziness.  WBC continues to slowly trend down however LFTs are now elevated again.  Will do further workup and obtain a right upper quadrant ultrasound.  No other concerns or complaints at this time and anticipating discharging to SNF next 24 to 48 hours.  Objective: Vitals:   06/01/23 1926 06/02/23 0443 06/02/23 0500 06/02/23 0746  BP: (!) 162/79 (!) 153/75  (!) 158/92  Pulse: 75 79  80  Resp:    18  Temp: 98.3 F (36.8 C) 98.8 F (37.1 C)  98.6 F (37 C)  TempSrc: Oral Oral  Oral  SpO2: 94% 92%  96%  Weight:   125.9 kg   Height:       No intake or output data in the 24 hours ending 06/02/23 1448 Filed Weights   05/30/23 0538 06/01/23 0500 06/02/23 0500  Weight: 128.3 kg 126.7 kg 125.9 kg   Examination: Physical Exam:  Constitutional: WN/WD obese African-American female in no acute distress appears calm Respiratory: Diminished to auscultation bilaterally, no wheezing, rales, rhonchi or crackles. Normal respiratory effort and patient is not tachypenic. No accessory muscle use.  Unlabored breathing Cardiovascular: RRR, no murmurs / rubs / gallops. S1 and S2 auscultated. No extremity edema.  Abdomen: Soft, non-tender, distended secondary to body habitus. Bowel sounds positive.  GU: Deferred. Musculoskeletal: No clubbing / cyanosis of  digits/nails. No joint deformity upper and lower extremities. Skin: No rashes, lesions, ulcers on limited skin evaluation. No induration; Warm and dry.  Neurologic: CN 2-12 grossly intact with no focal deficits. Romberg sign and cerebellar reflexes not assessed.  Psychiatric: Normal judgment and insight.  She was somnolent and drowsy but easily arousable and a little withdrawn  Data Reviewed: I have personally reviewed following labs and imaging studies  CBC: Recent Labs  Lab 05/27/23 1213 05/30/23 0418 06/01/23 0155 06/01/23 1610 06/02/23 0022  WBC 15.0* 14.5* 14.4* 12.0* 11.9*  NEUTROABS  --   --   --  8.7* 8.3*  HGB 10.2* 9.1* 8.0* 8.2* 8.2*  HCT 31.3* 27.9* 24.3* 25.2* 25.2*  MCV 91.5 94.3 89.7 91.6 91.6  PLT 235 187 222 232 242  Basic Metabolic Panel: Recent Labs  Lab 05/29/23 0543 05/30/23 0418 06/01/23 0155 06/01/23 1610 06/02/23 0022  NA 143 139 138 143 141  K 5.2* 4.7 4.4 4.8 4.6  CL 109 107 108 108 111  CO2 25 22 23 22  21*  GLUCOSE 136* 172* 147* 183* 151*  BUN 34* 36* 34* 35* 35*  CREATININE 2.27* 2.24* 1.77* 1.83* 1.73*  CALCIUM 9.1 8.8* 8.6* 9.0 8.7*  MG  --   --   --  2.3 2.3  PHOS  --   --   --  3.6 3.6   GFR: Estimated Creatinine Clearance: 36.8 mL/min (A) (by C-G formula based on SCr of 1.73 mg/dL (H)). Liver Function Tests: Recent Labs  Lab 05/28/23 0450 05/30/23 0418 06/01/23 1610 06/02/23 0022  AST 30 31 155* 130*  ALT 24 22 112* 118*  ALKPHOS 34* 73 148* 137*  BILITOT 0.7 1.3* 0.9 0.7  PROT 6.2* 6.5 5.9* 5.7*  ALBUMIN 2.7* 2.4* 2.1* 2.0*   No results for input(s): "LIPASE", "AMYLASE" in the last 168 hours. No results for input(s): "AMMONIA" in the last 168 hours. Coagulation Profile: No results for input(s): "INR", "PROTIME" in the last 168 hours. Cardiac Enzymes: Recent Labs  Lab 05/28/23 0450  CKTOTAL 246*   BNP (last 3 results) No results for input(s): "PROBNP" in the last 8760 hours. HbA1C: No results for input(s):  "HGBA1C" in the last 72 hours. CBG: Recent Labs  Lab 06/01/23 1219 06/01/23 1649 06/01/23 2040 06/02/23 0746 06/02/23 1206  GLUCAP 153* 161* 183* 131* 199*   Lipid Profile: No results for input(s): "CHOL", "HDL", "LDLCALC", "TRIG", "CHOLHDL", "LDLDIRECT" in the last 72 hours. Thyroid Function Tests: No results for input(s): "TSH", "T4TOTAL", "FREET4", "T3FREE", "THYROIDAB" in the last 72 hours. Anemia Panel: No results for input(s): "VITAMINB12", "FOLATE", "FERRITIN", "TIBC", "IRON", "RETICCTPCT" in the last 72 hours. Sepsis Labs: No results for input(s): "PROCALCITON", "LATICACIDVEN" in the last 168 hours.  Recent Results (from the past 240 hour(s))  Surgical pcr screen     Status: None   Collection Time: 05/27/23  6:51 AM   Specimen: Nasal Mucosa; Nasal Swab  Result Value Ref Range Status   MRSA, PCR NEGATIVE NEGATIVE Final   Staphylococcus aureus NEGATIVE NEGATIVE Final    Comment: (NOTE) The Xpert SA Assay (FDA approved for NASAL specimens in patients 23 years of age and older), is one component of a comprehensive surveillance program. It is not intended to diagnose infection nor to guide or monitor treatment. Performed at [facility], [address], [place], [place] [address]   Culture, blood (Routine X 2) w Reflex to ID Panel     Status: None (Preliminary result)   Collection Time: 05/30/23  4:18 AM   Specimen: BLOOD RIGHT HAND  Result Value Ref Range Status   Specimen Description BLOOD RIGHT HAND  Final   Special Requests   Final    BOTTLES DRAWN AEROBIC AND ANAEROBIC Blood Culture adequate volume   Culture   Final    NO GROWTH 3 DAYS Performed at [facility], [address], [place], [place] [address]    Report Status PENDING  Incomplete  Culture, blood (Routine X 2) w Reflex to ID Panel     Status: None (Preliminary result)   Collection Time: 05/30/23  4:21 AM   Specimen: BLOOD RIGHT ARM  Result Value Ref Range Status   Specimen Description  BLOOD RIGHT ARM  Final   Special Requests   Final  BOTTLES DRAWN AEROBIC AND ANAEROBIC Blood Culture adequate volume   Culture   Final    NO GROWTH 3 DAYS Performed at [facility], [address], [place], [place] [address]    Report Status PENDING  Incomplete    Radiology Studies: No results found.  Scheduled Meds:  ascorbic acid  1,000 mg Oral Daily   aspirin EC  81 mg Oral Daily   donepezil  10 mg Oral Daily   gabapentin  100 mg Oral BID   heparin  5,000 Units Subcutaneous Q12H   insulin aspart  0-6 Units Subcutaneous TID WC   melatonin  3 mg Oral QHS   pantoprazole  40 mg Oral Daily   polyethylene glycol  17 g Oral Daily   sodium chloride flush  3 mL Intravenous Q12H   Continuous Infusions:  sodium chloride      LOS: 12 days   Marguerita Merles, DO [facility] Available via Epic secure chat 7am-7pm After these hours, please refer to coverage provider listed on [email or web address] 06/02/2023, 2:48 PM

## 2023-06-02 NOTE — Plan of Care (Signed)

## 2023-06-02 NOTE — Progress Notes (Signed)
Occupational Therapy Treatment Patient Details Name: Katherine Cook MRN: [id number] DOB: 06-10-1941 Today's Date: 06/02/2023   History of present illness 82 y.o. female admitted 05/20/23 after fall at home, down for 10-12 hrs. Pt with rhabdomyolysis, AKI on CKD. Course complicated by LLE weakness; negative CVA; MRI showed thoracic disc herniation with cord signal change. S/p T11-T12 transpedicular discectomy 8/16. PMH includes DM2, neuropathy, HTN, HLD, CKD, L foot fx (08/2022).   OT comments  Pt with slow progress today and significantly pain limited. Needing 2 person max A to come to EOB with log roll, but unable to tolerate this session with constant calling out. Poor maintenance of precautions during log roll with max cues to avoid twisting. Pt returned to bed and worked on UE strengthening as pt RN reporting staff has been feeding her. Pt reporting she likes to be fed but able to use BUE above head, bring to mouth, etc (does fatigue easily). Pt encouraged to continue with self feeding at next meal and RN agreeable. Poor tolerance of chair position in bed stating "please don't raise me up anymore" at 32 degrees. Max education regarding importance of upright positioning while acute. Patient will benefit from continued inpatient follow up therapy, <3 hours/day.       If plan is discharge home, recommend the following:  A lot of help with walking and/or transfers;Two people to help with walking and/or transfers;A lot of help with bathing/dressing/bathroom;Two people to help with bathing/dressing/bathroom;Assistance with cooking/housework;Assistance with feeding;Help with stairs or ramp for entrance;Assist for transportation   Equipment Recommendations  None recommended by OT    Recommendations for Other Services      Precautions / Restrictions Precautions Precautions: Fall;Back Precaution Comments: verbally reviewed precautions, with pt showing limited recall. Pt however, recalling log roll  techhnique but poor implementation needing max cues to avoid twisting Restrictions Weight Bearing Restrictions: No Other Position/Activity Restrictions: spinal precautions in room, reviewed.       Mobility Bed Mobility Overal bed mobility: Needs Assistance Bed Mobility: Rolling, Sidelying to Sit, Sit to Sidelying Rolling: Max assist Sidelying to sit: Max assist, +2 for physical assistance     Sit to sidelying: Max assist General bed mobility comments: max assist to roll fully to right with cues for sequence and assist of pad. Max assist to clear legs and elevate trunk from side with HOB 20 degrees and max cues. pt needing multiple rest breaks in sidelying before coming fully up to sitting. RN also assisting. Pt unable to tolerate and immediate return to supine due to max calling out    Transfers                         Balance Overall balance assessment: Needs assistance Sitting-balance support: Feet supported Sitting balance-Leahy Scale: Poor                                     ADL either performed or assessed with clinical judgement   ADL Overall ADL's : Needs assistance/impaired     Grooming: Set up;Sitting Grooming Details (indicate cue type and reason): in bed                                    Extremity/Trunk Assessment Upper Extremity Assessment Upper Extremity Assessment: Generalized weakness   Lower Extremity Assessment Lower Extremity  Assessment: Defer to PT evaluation        Vision   Vision Assessment?: No apparent visual deficits   Perception Perception Perception: Not tested   Praxis Praxis Praxis: Not tested    Cognition Arousal: Alert Behavior During Therapy: Flat affect Overall Cognitive Status: No family/caregiver present to determine baseline cognitive functioning                                 General Comments: unable to understand pt at times. requires increased time to answer  basic questions or follows commands, did not recall back precuations. Recalling basics of log roll but unaware how it relates to back precautions as well as needing max cues to mainatin back precautions        Exercises Exercises: Other exercises Other Exercises Other Exercises: BUE forward punches, shoulder flexion, composite grasp.    Shoulder Instructions       General Comments reviewed back precautions and pt with limited recall    Pertinent Vitals/ Pain       Pain Assessment Pain Assessment: Faces Faces Pain Scale: Hurts whole lot Pain Location: mid-back; pt calling out throughout session Pain Descriptors / Indicators: Discomfort, Grimacing, Guarding, Crying, Moaning Pain Intervention(s): Limited activity within patient's tolerance, Monitored during session, Repositioned, Premedicated before session  Home Living                                          Prior Functioning/Environment              Frequency  Min 1X/week        Progress Toward Goals  OT Goals(current goals can now be found in the care plan section)  Progress towards OT goals: Progressing toward goals  Acute Rehab OT Goals Patient Stated Goal: less pain OT Goal Formulation: With patient Time For Goal Achievement: 06/18/23 Potential to Achieve Goals: Good ADL Goals Pt Will Perform Grooming: standing;with min assist Pt Will Perform Lower Body Dressing: with mod assist;sit to/from stand Pt Will Transfer to Toilet: with mod assist;stand pivot transfer;bedside commode Additional ADL Goal #1: Pt will complete bed mobility with min A as a precursor to ADLs  Plan      Co-evaluation                 AM-PAC OT "6 Clicks" Daily Activity     Outcome Measure   Help from another person eating meals?: A Little Help from another person taking care of personal grooming?: A Little Help from another person toileting, which includes using toliet, bedpan, or urinal?: Total Help from  another person bathing (including washing, rinsing, drying)?: A Lot Help from another person to put on and taking off regular upper body clothing?: A Little Help from another person to put on and taking off regular lower body clothing?: Total 6 Click Score: 13    End of Session    OT Visit Diagnosis: Unsteadiness on feet (R26.81);Other abnormalities of gait and mobility (R26.89);Muscle weakness (generalized) (M62.81);History of falling (Z91.81)   Activity Tolerance Patient limited by pain   Patient Left in bed;with bed alarm set;with call bell/phone within reach   Nurse Communication Mobility status        Time: 1137-1206 OT Time Calculation (min): 29 min  Charges: OT General Charges $OT Visit: 1 Visit OT Treatments $Self Care/Home  Management : 8-22 mins $Therapeutic Exercise: 8-22 mins  Tyler Deis, OTR/L [facility] Acute Rehabilitation Office: [phone]   Katherine Cook 06/02/2023, 1:34 PM

## 2023-06-03 ENCOUNTER — Inpatient Hospital Stay (HOSPITAL_COMMUNITY): Payer: Medicare HMO

## 2023-06-03 DIAGNOSIS — W19XXXA Unspecified fall, initial encounter: Secondary | ICD-10-CM | POA: Diagnosis not present

## 2023-06-03 DIAGNOSIS — R7989 Other specified abnormal findings of blood chemistry: Secondary | ICD-10-CM | POA: Diagnosis not present

## 2023-06-03 DIAGNOSIS — M6282 Rhabdomyolysis: Secondary | ICD-10-CM | POA: Diagnosis not present

## 2023-06-03 DIAGNOSIS — N179 Acute kidney failure, unspecified: Secondary | ICD-10-CM | POA: Diagnosis not present

## 2023-06-03 LAB — COMPREHENSIVE METABOLIC PANEL
ALT: 104 U/L — ABNORMAL HIGH (ref 0–44)
AST: 72 U/L — ABNORMAL HIGH (ref 15–41)
Albumin: 2.1 g/dL — ABNORMAL LOW (ref 3.5–5.0)
Alkaline Phosphatase: 132 U/L — ABNORMAL HIGH (ref 38–126)
Anion gap: 8 (ref 5–15)
BUN: 31 mg/dL — ABNORMAL HIGH (ref 8–23)
CO2: 25 mmol/L (ref 22–32)
Calcium: 8.9 mg/dL (ref 8.9–10.3)
Chloride: 108 mmol/L (ref 98–111)
Creatinine, Ser: 1.85 mg/dL — ABNORMAL HIGH (ref 0.44–1.00)
GFR, Estimated: 27 mL/min — ABNORMAL LOW (ref >60)
Glucose, Bld: 148 mg/dL — ABNORMAL HIGH (ref 70–99)
Potassium: 4.7 mmol/L (ref 3.5–5.1)
Sodium: 141 mmol/L (ref 135–145)
Total Bilirubin: 0.8 mg/dL (ref 0.3–1.2)
Total Protein: 6.1 g/dL — ABNORMAL LOW (ref 6.5–8.1)

## 2023-06-03 LAB — GLUCOSE, CAPILLARY
Glucose-Capillary: 120 mg/dL — ABNORMAL HIGH (ref 70–99)
Glucose-Capillary: 134 mg/dL — ABNORMAL HIGH (ref 70–99)
Glucose-Capillary: 164 mg/dL — ABNORMAL HIGH (ref 70–99)

## 2023-06-03 LAB — CBC WITH DIFFERENTIAL/PLATELET
Abs Immature Granulocytes: 0.04 K/uL (ref 0.00–0.07)
Basophils Absolute: 0.1 K/uL (ref 0.0–0.1)
Basophils Relative: 0 %
Eosinophils Absolute: 0.5 K/uL (ref 0.0–0.5)
Eosinophils Relative: 5 %
HCT: 25.7 % — ABNORMAL LOW (ref 36.0–46.0)
Hemoglobin: 8.6 g/dL — ABNORMAL LOW (ref 12.0–15.0)
Immature Granulocytes: 0 %
Lymphocytes Relative: 15 %
Lymphs Abs: 1.7 K/uL (ref 0.7–4.0)
MCH: 30.7 pg (ref 26.0–34.0)
MCHC: 33.5 g/dL (ref 30.0–36.0)
MCV: 91.8 fL (ref 80.0–100.0)
Monocytes Absolute: 1 K/uL (ref 0.1–1.0)
Monocytes Relative: 9 %
Neutro Abs: 8.2 K/uL — ABNORMAL HIGH (ref 1.7–7.7)
Neutrophils Relative %: 71 %
Platelets: 293 K/uL (ref 150–400)
RBC: 2.8 MIL/uL — ABNORMAL LOW (ref 3.87–5.11)
RDW: 14.6 % (ref 11.5–15.5)
WBC: 11.5 K/uL — ABNORMAL HIGH (ref 4.0–10.5)
nRBC: 0 % (ref 0.0–0.2)

## 2023-06-03 LAB — MAGNESIUM: Magnesium: 2.2 mg/dL (ref 1.7–2.4)

## 2023-06-03 LAB — PHOSPHORUS: Phosphorus: 4.1 mg/dL (ref 2.5–4.6)

## 2023-06-03 MED ORDER — METRONIDAZOLE 500 MG/100ML IV SOLN
500.0000 mg | Freq: Two times a day (BID) | INTRAVENOUS | Status: DC
  Administered 2023-06-03 – 2023-06-06 (×7): 500 mg via INTRAVENOUS
  Filled 2023-06-03 (×7): qty 100

## 2023-06-03 MED ORDER — SODIUM CHLORIDE 0.9 % IV SOLN
2.0000 g | INTRAVENOUS | Status: DC
  Administered 2023-06-03 – 2023-06-06 (×4): 2 g via INTRAVENOUS
  Filled 2023-06-03 (×4): qty 20

## 2023-06-03 MED ORDER — TECHNETIUM TC 99M MEBROFENIN IV KIT
5.4000 | PACK | Freq: Once | INTRAVENOUS | Status: AC | PRN
  Administered 2023-06-03: 5.4 via INTRAVENOUS

## 2023-06-03 MED ORDER — MORPHINE SULFATE (PF) 4 MG/ML IV SOLN
3.0000 mg | Freq: Once | INTRAVENOUS | Status: AC
  Administered 2023-06-03: 3 mg via INTRAVENOUS
  Filled 2023-06-03: qty 1

## 2023-06-03 NOTE — Plan of Care (Signed)

## 2023-06-03 NOTE — Progress Notes (Signed)
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  When she arrived to the ED she was febrile 100.8 and labs showed an AKI as well as abnormal LFTs and rhabdomyolysis as well as subsequent leukocytosis.  Imaging was done and she had a head CT, CT C-spine, CT maxillofacial CTA as well as CT of the chest abdomen pelvis which showed no acute finding but did show a thyroid abnormality and ultrasound was recommended.  She was also found to have a nondisplaced femoral neck fracture however a right femoral neck 3 view x-ray was recommended but did not show any fracture.  She subsequently admitted for rhabdomyolysis and AKI on CKD stage IIIb and placed on IV fluids.  Given this she also had noted that she had some cystic changes in the left foot back in November after immobilizer was placed for traumatic left foot fracture and had progressed to the entire leg with worsening and left lower extremity weakness.  MRI was done and showed no evidence of acute stroke but she was seen by neurology underwent MRI of C-spine and T-spine on the MRI thoracic spine showed disc herniation with cord signal changes in the cervical stenosis with cord signal change.  Neurosurgery was consulted and recommended thoracic discectomy.  She underwent T11-T12 transpedicular thoracic discectomy with Dr. Johnsie Cancel on  05/27/2023.    PT OT recommending SNF however her WBC is now remains elevated and will need to ensure a trend back down prior to safe discharging.  Further workup has been done and currently blood cultures are negative and likely reactive but upon further review she had a Temperature on 8/18.  LFTs also trended up now again so we will check an acute hepatitis panel and a right quad ultrasound and it showed "Distended gallbladder with wall thickening. Shadowing stones and sludge. Please correlate with any clinical evidence of acute cholecystitis. If there is further concern of acute cholecystitis, HIDA scan may be useful for further delineation. No separate biliary ductal dilatation."  Given her findings on the right upper quadrant ultrasound will get a HIDA scan and involve general surgery and initiate antibiotics given concern for possible acute cholecystitis.  If cholecystitis is ruled out surgery will sign off however if it is ruled and they plan for surgical intervention likely a laparoscopic cholecystectomy as early as Saturday or Sunday.  Given this antibiotics were initiated.  If she has no cholecystitis and if continues to improve and WBC improves anticipate discharging to SNF in next 24 to 48 hours.  Assessment and Plan:  Acute Kidney Injury superimposed on CKD stage 3b: Metabolic Acidosis -BUN/Cr Trend: Recent Labs  Lab 05/28/23 0450 05/29/23 0543 05/30/23 0418 06/01/23 0155 06/01/23 1610 06/02/23 0022 06/03/23 0617  BUN 33* 34* 36* 34* 35* 35* 31*  CREATININE 1.94* 2.27* 2.24* 1.77* 1.83* 1.73* 1.85*  -Avoid Nephrotoxic Medications, Contrast Dyes, Hypotension and Dehydration to  Ensure Adequate Renal Perfusion and will need to Renally Adjust Meds -Patient had a slight metabolic acidosis with a CO2 of 21, anion gap of 9, chloride level of 111; patient's acidosis has improved and CO2 is 25, anion gap is 8, chloride 108 -Continue to Monitor and Trend Renal Function carefully and repeat  CMP in the AM  -Encourage oral fluid intake and continue to Follow Urine output Avoid nephrotoxic agents, follow-up urine output   Mild Hyperkalemia, improved -Resolved with Lokelma -Patient's K+ Level Trend: Recent Labs  Lab 05/28/23 0450 05/29/23 0543 05/30/23 0418 06/01/23 0155 06/01/23 1610 06/02/23 0022 06/03/23 0617  K 4.7 5.2* 4.7 4.4 4.8 4.6 4.7  -Continue to Monitor and Replete as Necessary -Repeat CMP in the AM    Rhabdomyolysis status post fall and prolonged pressure injury. -CPK levels trending down, repeat CK 246.  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 Maurice Small on 05/27/23 -Pain control as needed -Postop care per neurosurgery recommendations-will resume Lovenox later as recommended by neurosurgery -As per neurosurgery--will likely need rehab  -PT/OT on board and recommending SNF and will discharge once she is further medically stable   Chest Pain -Resolved. -EKG showed normal sinus rhythm heart rate 76.  -No ST and T wave abnormality. Troponin 16.  -Denies any chest pain.  -Ruled out ACS.    Essential Hypertension, Moderately controlled -C/w IV Hydralazine 5 mg q6hprn SBP >160 -Continue to Monitor BP per Protocol -Last BP reading was elevated at 151/67   Hyperlipidemia -Rosuvastatin was initially held due to rhabdomyolysis and Abnormal LFTs -Resumed home statin with Rosuvastatin 40 mg po Daily but holding again given Abnormal LFTs   Insulin Dependent Type 2 Diabetes Mellitus (HCC) -Hemoglobin A1c 6.4 -C/w Carbohydrate modified diet. -Discontinued Levemir 10 units twice daily -C/w Very Sensitive Novolog SSI AC -Continue to Monitor and Trend CBG's and Glucose Levels: Recent Labs  Lab 06/01/23 1649 06/01/23 2040 06/02/23 0746 06/02/23 1206 06/02/23 1639 06/02/23 2026 06/03/23 0737  GLUCAP 161* 183* 131* 199* 141*  162* 120*   Recent Labs  Lab 05/28/23 0450 05/29/23 0543 05/30/23 0418 06/01/23 0155 06/01/23 1610 06/02/23 0022 06/03/23 0617  GLUCOSE 168* 136* 172* 147* 183* 151* 148*    Leukocytosis and Fever in the setting of ? Acute Cholecystitis  -Had a Temperature on 8/18 and was almost 103 Degrees Farenheit  -Unclear Etiology but ? Reactive and trending down Recent Labs  Lab 05/26/23 0149 05/27/23 1213 05/30/23 0418 06/01/23 0155 06/01/23 1610 06/02/23 0022 06/03/23 0617  WBC 8.4 15.0* 14.5* 14.4* 12.0* 11.9* 11.5*  -U/A Negative  -Blood Cx x2 done and showed NGTD at 4 Days  -RUQ U/S done and showed "Distended gallbladder with wall thickening. Shadowing stones and sludge. Please correlate with any clinical evidence of acute cholecystitis. If there is further concern of acute cholecystitis, HIDA scan may be useful for further delineation. No separate biliary ductal dilatation" -Obtain HIDA Scan -Add IV Abc with Ceftriaxone and Metronidazole -Repeat CXR in the AM -Continue to Monitor for S/Sx of Infection -General Surgery consulted for further evaluation and recommendations and are recommending following up on the HIDA scan if the HIDA did not show any evidence of acute cholecystitis we will plan to sign off but if the HIDA shows evidence of acute cholecystitis there is no plan for surgical intervention with a laparoscopic cholecystectomy likely as early as Saturday or Sunday  Normocytic  Anemia -Hgb/Hct Trend: Recent Labs  Lab 05/26/23 0149 05/27/23 1213 05/30/23 0418 06/01/23 0155 06/01/23 1610 06/02/23 0022 06/03/23 0617  HGB 9.4* 10.2* 9.1* 8.0* 8.2* 8.2* 8.6*  HCT 28.6* 31.3* 27.9* 24.3* 25.2* 25.2* 25.7*  MCV 92.6 91.5 94.3 89.7 91.6 91.6 91.8  -Check Anemia Panel in the AM -Continue to Monitor for S/Sx of Bleeding; No overt bleeding noted -Repeat CBC in the AM    Hyperbilirubinemia  Abnormal LFTs and Elevated LFTs Recent Labs  Lab 05/22/23 0345 05/23/23 0140  05/28/23 0450 05/30/23 0418 06/01/23 1610 06/02/23 0022 06/03/23 0617  AST 62* 44* 30 31 155* 130* 72*  ALT 23 22 24 22  112* 118* 104*  BILITOT 1.2 0.7 0.7 1.3* 0.9 0.7 0.8  ALKPHOS 32* 30* 34* 73 148* 137* 132*  -Will check RUQ U/S and it showed "Distended gallbladder with wall thickening. Shadowing stones and sludge. Please correlate with any clinical evidence of acute cholecystitis. If there is further concern of acute cholecystitis, HIDA scan may be useful for further delineation. No separate biliary ductal dilatation" -Acute Hepatitis Panel was negative -Continue to Monitor and Trend and repeat CMP in the AM    Enlarged Thyroid -Incidental finding on CT. -Workup negative with Thyroid U/S and showed "Enlarged heterogeneous thyroid without discrete nodule." -Will need Outpatient follow-up with Endocrinology    Dehydration -Resolved  GERD/GI Prophylaxis -C/w Pantoprazole 40 mg po daily   Hypoalbuminemia -Patient's Albumin Trend: Recent Labs  Lab 05/22/23 0345 05/23/23 0140 05/28/23 0450 05/30/23 0418 06/01/23 1610 06/02/23 0022 06/03/23 0617  ALBUMIN 2.8* 2.5* 2.7* 2.4* 2.1* 2.0* 2.1*  -Continue to Monitor and Trend and repeat CMP in the AM  Obesity -Complicates overall prognosis and care -Estimated body mass index is 40.02 kg/m as calculated from the following:   Height as of this encounter: 5\' 10"  (1.778 m).   Weight as of this encounter: 126.5 kg.  -Weight Loss and Dietary Counseling given  DVT prophylaxis: heparin injection 5,000 Units Start: 05/29/23 2200 SCDs Start: 05/21/23 0450 Place TED hose Start: 05/21/23 0450    Code Status: Full Code Family Communication: No family present at bedside   Disposition Plan:  Level of care: Telemetry Medical Status is: Inpatient Remains inpatient appropriate because: Needs further workup and clearance    Consultants:  Neurosurgery Neurology Cardiology General Surgery  Procedures:  As delineated as  above  Antimicrobials:  Anti-infectives (From admission, onward)    Start     Dose/Rate Route Frequency Ordered Stop   06/03/23 0930  cefTRIAXone (ROCEPHIN) 2 g in sodium chloride 0.9 % 100 mL IVPB        2 g 200 mL/hr over 30 Minutes Intravenous Every 24 hours 06/03/23 0842     06/03/23 0930  metroNIDAZOLE (FLAGYL) IVPB 500 mg        500 mg 100 mL/hr over 60 Minutes Intravenous Every 12 hours 06/03/23 0842     05/27/23 0720  ceFAZolin (ANCEF) 3-0.9 GM/100ML-% IVPB       Note to Pharmacy: Camillia Herter A: cabinet override      05/27/23 0720 05/27/23 1929   05/22/23 1100  cefTRIAXone (ROCEPHIN) 1 g in sodium chloride 0.9 % 100 mL IVPB        1 g 200 mL/hr over 30 Minutes Intravenous Every 24 hours 05/22/23 1015 05/24/23 1151       Subjective: Seen and examined at bedside and continues to be "sore."  Today she complains of being cold. States that she continues have some abdominal  pain especially when it is palpated.  No nausea or vomiting currently.  No other concerns or complaints this time.  Objective: Vitals:   06/02/23 2021 06/03/23 0045 06/03/23 0538 06/03/23 0733  BP: (!) 149/69 (!) 143/73 (!) 143/67 (!) 151/67  Pulse: 69 74 68 65  Resp: 18 17 18 18   Temp: 98.5 F (36.9 C) 98.5 F (36.9 C) 98 F (36.7 C) 97.9 F (36.6 C)  TempSrc: Oral Oral Oral Oral  SpO2: 100% 94% 93% 96%  Weight:   126.5 kg   Height:        Intake/Output Summary (Last 24 hours) at 06/03/2023 1658 Last data filed at 06/03/2023 0541 Gross per 24 hour  Intake --  Output 400 ml  Net -400 ml   Filed Weights   06/01/23 0500 06/02/23 0500 06/03/23 0538  Weight: 126.7 kg 125.9 kg 126.5 kg   Examination: Physical Exam:  Constitutional: WN/WD, obese African-American female who appears slightly uncomfortable Respiratory: Diminished to auscultation bilaterally, no wheezing, rales, rhonchi or crackles. Normal respiratory effort and patient is not tachypenic. No accessory muscle use.  Unlabored  breathing Cardiovascular: RRR, no murmurs / rubs / gallops. S1 and S2 auscultated.  Mild extremity edema Abdomen: Soft, slightly-tender, distended secondary to body habitus. Bowel sounds positive.  GU: Deferred. Musculoskeletal: No clubbing / cyanosis of digits/nails. No joint deformity upper and lower extremities. Skin: No rashes, lesions, ulcers on limited skin evaluation. No induration; Warm and dry.  Neurologic: CN 2-12 grossly intact with no focal deficits. Romberg sign and cerebellar reflexes not assessed.  Psychiatric: Normal judgment and insight.  Appears a little withdrawn again wanting to rest   Data Reviewed: I have personally reviewed following labs and imaging studies  CBC: Recent Labs  Lab 05/30/23 0418 06/01/23 0155 06/01/23 1610 06/02/23 0022 06/03/23 0617  WBC 14.5* 14.4* 12.0* 11.9* 11.5*  NEUTROABS  --   --  8.7* 8.3* 8.2*  HGB 9.1* 8.0* 8.2* 8.2* 8.6*  HCT 27.9* 24.3* 25.2* 25.2* 25.7*  MCV 94.3 89.7 91.6 91.6 91.8  PLT 187 222 232 242 293   Basic Metabolic Panel: Recent Labs  Lab 05/30/23 0418 06/01/23 0155 06/01/23 1610 06/02/23 0022 06/03/23 0617  NA 139 138 143 141 141  K 4.7 4.4 4.8 4.6 4.7  CL 107 108 108 111 108  CO2 22 23 22  21* 25  GLUCOSE 172* 147* 183* 151* 148*  BUN 36* 34* 35* 35* 31*  CREATININE 2.24* 1.77* 1.83* 1.73* 1.85*  CALCIUM 8.8* 8.6* 9.0 8.7* 8.9  MG  --   --  2.3 2.3 2.2  PHOS  --   --  3.6 3.6 4.1   GFR: Estimated Creatinine Clearance: 34.5 mL/min (A) (by C-G formula based on SCr of 1.85 mg/dL (H)). Liver Function Tests: Recent Labs  Lab 05/28/23 0450 05/30/23 0418 06/01/23 1610 06/02/23 0022 06/03/23 0617  AST 30 31 155* 130* 72*  ALT 24 22 112* 118* 104*  ALKPHOS 34* 73 148* 137* 132*  BILITOT 0.7 1.3* 0.9 0.7 0.8  PROT 6.2* 6.5 5.9* 5.7* 6.1*  ALBUMIN 2.7* 2.4* 2.1* 2.0* 2.1*   No results for input(s): "LIPASE", "AMYLASE" in the last 168 hours. No results for input(s): "AMMONIA" in the last 168  hours. Coagulation Profile: No results for input(s): "INR", "PROTIME" in the last 168 hours. Cardiac Enzymes: Recent Labs  Lab 05/28/23 0450  CKTOTAL 246*   BNP (last 3 results) No results for input(s): "PROBNP" in the last 8760 hours. HbA1C: No results for  input(s): "HGBA1C" in the last 72 hours. CBG: Recent Labs  Lab 06/02/23 0746 06/02/23 1206 06/02/23 1639 06/02/23 2026 06/03/23 0737  GLUCAP 131* 199* 141* 162* 120*   Lipid Profile: No results for input(s): "CHOL", "HDL", "LDLCALC", "TRIG", "CHOLHDL", "LDLDIRECT" in the last 72 hours. Thyroid Function Tests: No results for input(s): "TSH", "T4TOTAL", "FREET4", "T3FREE", "THYROIDAB" in the last 72 hours. Anemia Panel: No results for input(s): "VITAMINB12", "FOLATE", "FERRITIN", "TIBC", "IRON", "RETICCTPCT" in the last 72 hours. Sepsis Labs: No results for input(s): "PROCALCITON", "LATICACIDVEN" in the last 168 hours.  Recent Results (from the past 240 hour(s))  Surgical pcr screen     Status: None   Collection Time: 05/27/23  6:51 AM   Specimen: Nasal Mucosa; Nasal Swab  Result Value Ref Range Status   MRSA, PCR NEGATIVE NEGATIVE Final   Staphylococcus aureus NEGATIVE NEGATIVE Final    Comment: (NOTE) The Xpert SA Assay (FDA approved for NASAL specimens in patients 79 years of age and older), is one component of a comprehensive surveillance program. It is not intended to diagnose infection nor to guide or monitor treatment. Performed at [facility], [address] [place], [place] [address]   Culture, blood (Routine X 2) w Reflex to ID Panel     Status: None (Preliminary result)   Collection Time: 05/30/23  4:18 AM   Specimen: BLOOD RIGHT HAND  Result Value Ref Range Status   Specimen Description BLOOD RIGHT HAND  Final   Special Requests   Final    BOTTLES DRAWN AEROBIC AND ANAEROBIC Blood Culture adequate volume   Culture   Final    NO GROWTH 4 DAYS Performed at [facility], [address] [place], [place] [address]    Report Status PENDING  Incomplete  Culture, blood (Routine X 2) w Reflex to ID Panel     Status: None (Preliminary result)   Collection Time: 05/30/23  4:21 AM   Specimen: BLOOD RIGHT ARM  Result Value Ref Range Status   Specimen Description BLOOD RIGHT ARM  Final   Special Requests   Final    BOTTLES DRAWN AEROBIC AND ANAEROBIC Blood Culture adequate volume   Culture   Final    NO GROWTH 4 DAYS Performed at [facility], [address], [place], [place] [address]    Report Status PENDING  Incomplete     Radiology Studies: US Abdomen Limited RUQ (LIVER/GB)  Result Date: 06/02/2023 CLINICAL DATA:  Abnormal liver function tests EXAM: ULTRASOUND ABDOMEN LIMITED RIGHT UPPER QUADRANT COMPARISON:  CT 05/20/2023 FINDINGS: Gallbladder: Distended gallbladder with shadowing stones. There is wall thickening up to 6 mm. There is also some sludge. No sonographic Murphy's sign reported. Common bile duct: Diameter: 5 mm Liver: No focal lesion identified. Within normal limits in parenchymal echogenicity. Portal vein is patent on color Doppler imaging with normal direction of blood flow towards the liver. Other: None. IMPRESSION: Distended gallbladder with wall thickening. Shadowing stones and sludge. Please correlate with any clinical evidence of acute cholecystitis. If there is further concern of acute cholecystitis, HIDA scan may be useful for further delineation. No separate biliary ductal dilatation Electronically Signed   By: Karen Kays M.D.   On: 06/02/2023 20:17    Scheduled Meds:  ascorbic acid  1,000 mg Oral Daily   aspirin EC  81 mg Oral Daily   donepezil  10 mg Oral Daily   gabapentin  100 mg Oral BID   heparin  5,000 Units  Subcutaneous Q12H   insulin aspart  0-6 Units Subcutaneous TID WC   melatonin  3 mg Oral QHS   pantoprazole  40 mg Oral Daily   polyethylene glycol  17 g Oral Daily   sodium chloride flush  3 mL Intravenous Q12H   Continuous  Infusions:  sodium chloride     cefTRIAXone (ROCEPHIN)  IV 2 g (06/03/23 1023)   metronidazole 500 mg (06/03/23 1025)    LOS: 13 days   Marguerita Merles, DO [facility] Available via Epic secure chat 7am-7pm After these hours, please refer to coverage provider listed on [email or web address] 06/03/2023, 4:58 PM

## 2023-06-03 NOTE — Consult Note (Signed)
Katherine Cook 1941/08/09  [id number].    Requesting MD: Dr. Marguerita Merles Chief Complaint/Reason for Consult: Possible Cholecystitis   HPI: Katherine Cook is a 82 y.o. female with a hx of CKD3b, IDDM2, HLD, HTN who we were asked to see for possible cholecystitis.   Patient presented on 8/9 after after a fall.  She was found to have rhabdomyolysis and was admitted to the hospital.  Neurology was consulted and MRI C and T-spine were ordered.  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  Neurosurgery was consulted.  Patient underwent a T11-T12 transpedicular thoracic discectomy by Dr. Johnsie Cancel on 8/16.  Patient reports on 8/19 she started having some mid to right sided abdominal pain for about 2 hours after eating.  No associated nausea or vomiting.  This has since resolved and she is now tolerating diet without any abdominal pain, nausea or vomiting.  BM yesterday.  Denies any urinary symptoms.  History of prior abdominal hysterectomy.  She is on baby aspirin daily at baseline.  ROS: ROS As above, see hpi  History reviewed. No pertinent family history.  Past Medical History:  Diagnosis Date   Diabetes mellitus without complication [facility])     Past Surgical History:  Procedure Laterality Date   ABDOMINAL HYSTERECTOMY     BACK SURGERY     NECK SURGERY     REPLACEMENT TOTAL KNEE BILATERAL     THORACIC DISCECTOMY N/A 05/27/2023   Procedure: THORACIC DISCECTOMY THORACICI ELEVEN-TWELVE;  Surgeon: Jadene Pierini, MD;  Location: [facility] OR;  Service: Neurosurgery;  Laterality: N/A;    Social History:  reports that she has never smoked. She has never used smokeless tobacco. She reports that she does not drink alcohol and does not use drugs.  Allergies: No Known Allergies  Medications Prior to Admission  Medication Sig Dispense Refill   amLODipine (NORVASC) 10 MG tablet Take 10 mg by mouth daily.      ascorbic acid (VITAMIN C) 1000 MG tablet Take 1,000 mg by mouth daily.     aspirin EC 81 MG tablet Take 81 mg by mouth daily.     celecoxib (CELEBREX) 200 MG capsule Take 200 mg by mouth daily.     donepezil (ARICEPT) 10 MG tablet Take 10 mg by mouth daily.     furosemide (LASIX) 40 MG tablet Take 40 mg by mouth daily.     gabapentin (NEURONTIN) 100 MG capsule Take 100 mg by mouth 2 (two) times daily.     LEVEMIR FLEXPEN 100 UNIT/ML FlexPen Inject 10 Units into the skin 2 (two) times daily. 10 units in the am, and 10 units in the pm.     losartan (COZAAR) 50 MG tablet Take 25 mg by mouth daily.     MOUNJARO 10 MG/0.5ML Pen Inject into the skin.     omeprazole (PRILOSEC) 20 MG capsule Take 20 mg by mouth daily.     pregabalin (LYRICA) 100 MG capsule Take 100 mg by mouth 2 (two) times daily.     rosuvastatin (CRESTOR) 40 MG tablet Take 40 mg by mouth daily.     SUMAtriptan (IMITREX) 100 MG tablet Take 1 tablet (100 mg total) by mouth every 6 (six) hours as needed for migraine. May repeat in 2 hours if headache persists or recurs. 8 tablet 0     Physical Exam: Blood pressure (!) 151/67, pulse 65, temperature 97.9 F (36.6 C), temperature source  Oral, resp. rate 18, height 5\' 10"  (1.778 m), weight 126.5 kg, SpO2 96%. Gen:  Alert, NAD, pleasant Card:  RRR Pulm:  CTAB, no W/R/R, effort normal Abd: Soft, ND, NT, +BS Psych: A&Ox3   Results for orders placed or performed during the hospital encounter of 05/20/23 (from the past 48 hour(s))  Glucose, capillary     Status: Abnormal   Collection Time: 06/01/23 12:19 PM  Result Value Ref Range   Glucose-Capillary 153 (H) 70 - 99 mg/dL    Comment: Glucose reference range applies only to samples taken after fasting for at least 8 hours.  CBC with Differential/Platelet     Status: Abnormal   Collection Time: 06/01/23  4:10 PM  Result Value Ref Range   WBC 12.0 (H) 4.0 - 10.5 K/uL   RBC 2.75 (L) 3.87 - 5.11 MIL/uL   Hemoglobin 8.2 (L) 12.0 - 15.0  g/dL   HCT 16.1 (L) 09.6 - 04.5 %   MCV 91.6 80.0 - 100.0 fL   MCH 29.8 26.0 - 34.0 pg   MCHC 32.5 30.0 - 36.0 g/dL   RDW 40.9 81.1 - 91.4 %   Platelets 232 150 - 400 K/uL   nRBC 0.0 0.0 - 0.2 %   Neutrophils Relative % 74 %   Neutro Abs 8.7 (H) 1.7 - 7.7 K/uL   Lymphocytes Relative 14 %   Lymphs Abs 1.7 0.7 - 4.0 K/uL   Monocytes Relative 8 %   Monocytes Absolute 1.0 0.1 - 1.0 K/uL   Eosinophils Relative 4 %   Eosinophils Absolute 0.5 0.0 - 0.5 K/uL   Basophils Relative 0 %   Basophils Absolute 0.1 0.0 - 0.1 K/uL   Immature Granulocytes 0 %   Abs Immature Granulocytes 0.04 0.00 - 0.07 K/uL    Comment: Performed at [facility], [address] [place], [place] [address]  Comprehensive metabolic panel     Status: Abnormal   Collection Time: 06/01/23  4:10 PM  Result Value Ref Range   Sodium 143 135 - 145 mmol/L   Potassium 4.8 3.5 - 5.1 mmol/L   Chloride 108 98 - 111 mmol/L   CO2 22 22 - 32 mmol/L   Glucose, Bld 183 (H) 70 - 99 mg/dL    Comment: Glucose reference range applies only to samples taken after fasting for at least 8 hours.   BUN 35 (H) 8 - 23 mg/dL   Creatinine, Ser 6.21 (H) 0.44 - 1.00 mg/dL   Calcium 9.0 8.9 - 30.8 mg/dL   Total Protein 5.9 (L) 6.5 - 8.1 g/dL   Albumin 2.1 (L) 3.5 - 5.0 g/dL   AST 657 (H) 15 - 41 U/L   ALT 112 (H) 0 - 44 U/L   Alkaline Phosphatase 148 (H) 38 - 126 U/L   Total Bilirubin 0.9 0.3 - 1.2 mg/dL   GFR, Estimated 27 (L) >60 mL/min    Comment: (NOTE) Calculated using the CKD-EPI Creatinine Equation (2021)    Anion gap 13 5 - 15    Comment: Performed at [facility], [address] [place], [place] [address]  Magnesium     Status: None   Collection Time: 06/01/23  4:10 PM  Result Value Ref Range   Magnesium 2.3 1.7 - 2.4 mg/dL    Comment: Performed at [facility], [address] [place], [place] [address]  Phosphorus     Status: None   Collection Time: 06/01/23  4:10 PM  Result Value  Ref Range   Phosphorus  3.6 2.5 - 4.6 mg/dL    Comment: Performed at [facility], [address] [place], [place] [address]  Glucose, capillary     Status: Abnormal   Collection Time: 06/01/23  4:49 PM  Result Value Ref Range   Glucose-Capillary 161 (H) 70 - 99 mg/dL    Comment: Glucose reference range applies only to samples taken after fasting for at least 8 hours.  Glucose, capillary     Status: Abnormal   Collection Time: 06/01/23  8:40 PM  Result Value Ref Range   Glucose-Capillary 183 (H) 70 - 99 mg/dL    Comment: Glucose reference range applies only to samples taken after fasting for at least 8 hours.  CBC with Differential/Platelet     Status: Abnormal   Collection Time: 06/02/23 12:22 AM  Result Value Ref Range   WBC 11.9 (H) 4.0 - 10.5 K/uL   RBC 2.75 (L) 3.87 - 5.11 MIL/uL   Hemoglobin 8.2 (L) 12.0 - 15.0 g/dL   HCT 60.4 (L) 54.0 - 98.1 %   MCV 91.6 80.0 - 100.0 fL   MCH 29.8 26.0 - 34.0 pg   MCHC 32.5 30.0 - 36.0 g/dL   RDW 19.1 47.8 - 29.5 %   Platelets 242 150 - 400 K/uL   nRBC 0.0 0.0 - 0.2 %   Neutrophils Relative % 69 %   Neutro Abs 8.3 (H) 1.7 - 7.7 K/uL   Lymphocytes Relative 17 %   Lymphs Abs 2.0 0.7 - 4.0 K/uL   Monocytes Relative 8 %   Monocytes Absolute 1.0 0.1 - 1.0 K/uL   Eosinophils Relative 5 %   Eosinophils Absolute 0.6 (H) 0.0 - 0.5 K/uL   Basophils Relative 0 %   Basophils Absolute 0.0 0.0 - 0.1 K/uL   Immature Granulocytes 1 %   Abs Immature Granulocytes 0.06 0.00 - 0.07 K/uL    Comment: Performed at [facility], [address] [place], [place] [address]  Comprehensive metabolic panel     Status: Abnormal   Collection Time: 06/02/23 12:22 AM  Result Value Ref Range   Sodium 141 135 - 145 mmol/L   Potassium 4.6 3.5 - 5.1 mmol/L   Chloride 111 98 - 111 mmol/L   CO2 21 (L) 22 - 32 mmol/L   Glucose, Bld 151 (H) 70 - 99 mg/dL    Comment: Glucose reference range applies only to samples taken after fasting for at least 8 hours.   BUN 35 (H) 8 - 23 mg/dL    Creatinine, Ser 8.65 (H) 0.44 - 1.00 mg/dL   Calcium 8.7 (L) 8.9 - 10.3 mg/dL   Total Protein 5.7 (L) 6.5 - 8.1 g/dL   Albumin 2.0 (L) 3.5 - 5.0 g/dL   AST 784 (H) 15 - 41 U/L   ALT 118 (H) 0 - 44 U/L   Alkaline Phosphatase 137 (H) 38 - 126 U/L   Total Bilirubin 0.7 0.3 - 1.2 mg/dL   GFR, Estimated 29 (L) >60 mL/min    Comment: (NOTE) Calculated using the CKD-EPI Creatinine Equation (2021)    Anion gap 9 5 - 15    Comment: Performed at [facility], [address], [place], [place] [address]  Phosphorus     Status: None   Collection Time: 06/02/23 12:22 AM  Result Value Ref Range   Phosphorus 3.6 2.5 - 4.6 mg/dL    Comment: Performed at [facility], [address], [place], [place] [address]  Magnesium     Status: None   Collection Time: 06/02/23 12:22 AM  Result Value Ref Range   Magnesium 2.3 1.7 - 2.4 mg/dL    Comment: Performed at [facility], [address] [place], [place] [address]  Glucose, capillary     Status: Abnormal   Collection Time: 06/02/23  7:46 AM  Result Value Ref Range   Glucose-Capillary 131 (H) 70 - 99 mg/dL    Comment: Glucose reference range applies only to samples taken after fasting for at least 8 hours.  Hepatitis panel, acute     Status: None   Collection Time: 06/02/23  9:21 AM  Result Value Ref Range   Hepatitis B Surface Ag NON REACTIVE NON REACTIVE   HCV Ab NON REACTIVE NON REACTIVE    Comment: (NOTE) Nonreactive HCV antibody screen is consistent with no HCV infections,  unless recent infection is suspected or other evidence exists to indicate HCV infection.     Hep A IgM NON REACTIVE NON REACTIVE   Hep B C IgM NON REACTIVE NON REACTIVE    Comment: Performed at [facility], [address], [place], [place] [address]  Glucose, capillary     Status: Abnormal   Collection Time: 06/02/23 12:06 PM  Result Value Ref Range   Glucose-Capillary 199 (H) 70 - 99 mg/dL    Comment: Glucose reference range applies only to  samples taken after fasting for at least 8 hours.  Glucose, capillary     Status: Abnormal   Collection Time: 06/02/23  4:39 PM  Result Value Ref Range   Glucose-Capillary 141 (H) 70 - 99 mg/dL    Comment: Glucose reference range applies only to samples taken after fasting for at least 8 hours.  Glucose, capillary     Status: Abnormal   Collection Time: 06/02/23  8:26 PM  Result Value Ref Range   Glucose-Capillary 162 (H) 70 - 99 mg/dL    Comment: Glucose reference range applies only to samples taken after fasting for at least 8 hours.  CBC with Differential/Platelet     Status: Abnormal   Collection Time: 06/03/23  6:17 AM  Result Value Ref Range   WBC 11.5 (H) 4.0 - 10.5 K/uL   RBC 2.80 (L) 3.87 - 5.11 MIL/uL   Hemoglobin 8.6 (L) 12.0 - 15.0 g/dL   HCT 40.1 (L) 02.7 - 25.3 %   MCV 91.8 80.0 - 100.0 fL   MCH 30.7 26.0 - 34.0 pg   MCHC 33.5 30.0 - 36.0 g/dL   RDW 66.4 40.3 - 47.4 %   Platelets 293 150 - 400 K/uL   nRBC 0.0 0.0 - 0.2 %   Neutrophils Relative % 71 %   Neutro Abs 8.2 (H) 1.7 - 7.7 K/uL   Lymphocytes Relative 15 %   Lymphs Abs 1.7 0.7 - 4.0 K/uL   Monocytes Relative 9 %   Monocytes Absolute 1.0 0.1 - 1.0 K/uL   Eosinophils Relative 5 %   Eosinophils Absolute 0.5 0.0 - 0.5 K/uL   Basophils Relative 0 %   Basophils Absolute 0.1 0.0 - 0.1 K/uL   Immature Granulocytes 0 %   Abs Immature Granulocytes 0.04 0.00 - 0.07 K/uL    Comment: Performed at [facility], [address] [name], [place] [address]  Comprehensive metabolic panel     Status: Abnormal   Collection Time: 06/03/23  6:17 AM  Result Value Ref Range  Sodium 141 135 - 145 mmol/L   Potassium 4.7 3.5 - 5.1 mmol/L   Chloride 108 98 - 111 mmol/L   CO2 25 22 - 32 mmol/L   Glucose, Bld 148 (H) 70 - 99 mg/dL    Comment: Glucose reference range applies only to samples taken after fasting for at least 8 hours.   BUN 31 (H) 8 - 23 mg/dL   Creatinine, Ser 8.46 (H) 0.44 - 1.00 mg/dL   Calcium 8.9 8.9 -  96.2 mg/dL   Total Protein 6.1 (L) 6.5 - 8.1 g/dL   Albumin 2.1 (L) 3.5 - 5.0 g/dL   AST 72 (H) 15 - 41 U/L   ALT 104 (H) 0 - 44 U/L   Alkaline Phosphatase 132 (H) 38 - 126 U/L   Total Bilirubin 0.8 0.3 - 1.2 mg/dL   GFR, Estimated 27 (L) >60 mL/min    Comment: (NOTE) Calculated using the CKD-EPI Creatinine Equation (2021)    Anion gap 8 5 - 15    Comment: Performed at [facility], [address] [place], [place] [address]  Phosphorus     Status: None   Collection Time: 06/03/23  6:17 AM  Result Value Ref Range   Phosphorus 4.1 2.5 - 4.6 mg/dL    Comment: Performed at [facility], [address], [place], [place] [address]  Magnesium     Status: None   Collection Time: 06/03/23  6:17 AM  Result Value Ref Range   Magnesium 2.2 1.7 - 2.4 mg/dL    Comment: Performed at [facility], [address] [place], [place] [address]  Glucose, capillary     Status: Abnormal   Collection Time: 06/03/23  7:37 AM  Result Value Ref Range   Glucose-Capillary 120 (H) 70 - 99 mg/dL    Comment: Glucose reference range applies only to samples taken after fasting for at least 8 hours.   US Abdomen Limited RUQ (LIVER/GB)  Result Date: 06/02/2023 CLINICAL DATA:  Abnormal liver function tests EXAM: ULTRASOUND ABDOMEN LIMITED RIGHT UPPER QUADRANT COMPARISON:  CT 05/20/2023 FINDINGS: Gallbladder: Distended gallbladder with shadowing stones. There is wall thickening up to 6 mm. There is also some sludge. No sonographic Murphy's sign reported. Common bile duct: Diameter: 5 mm Liver: No focal lesion identified. Within normal limits in parenchymal echogenicity. Portal vein is patent on color Doppler imaging with normal direction of blood flow towards the liver. Other: None. IMPRESSION: Distended gallbladder with wall thickening. Shadowing stones and sludge. Please correlate with any clinical evidence of acute cholecystitis. If there is further concern of acute cholecystitis, HIDA scan may be  useful for further delineation. No separate biliary ductal dilatation Electronically Signed   By: Karen Kays M.D.   On: 06/02/2023 20:17    Anti-infectives (From admission, onward)    Start     Dose/Rate Route Frequency Ordered Stop   06/03/23 0930  cefTRIAXone (ROCEPHIN) 2 g in sodium chloride 0.9 % 100 mL IVPB        2 g 200 mL/hr over 30 Minutes Intravenous Every 24 hours 06/03/23 0842     06/03/23 0930  metroNIDAZOLE (FLAGYL) IVPB 500 mg        500 mg 100 mL/hr over 60 Minutes Intravenous Every 12 hours 06/03/23 0842     05/27/23 0720  ceFAZolin (ANCEF) 3-0.9 GM/100ML-% IVPB       Note to Pharmacy: Camillia Herter A: cabinet override      05/27/23 0720 05/27/23 1929  05/22/23 1100  cefTRIAXone (ROCEPHIN) 1 g in sodium chloride 0.9 % 100 mL IVPB        1 g 200 mL/hr over 30 Minutes Intravenous Every 24 hours 05/22/23 1015 05/24/23 1151       Assessment/Plan Possible Cholecystitis - No indication for emergency surgery - Patient is currently pain-free, tolerating diet and NT on exam.  Her WBC and LFTs are downtrending with normal T. bili today.  Agree with plan for HIDA that has already been ordered.  If HIDA does not show any evidence of acute cholecystitis we will plan to sign off.  If HIDA does show evidence of acute cholecystitis we will plan to follow-up with further recommendations  FEN - NPO for HIDA VTE - SCDs, subcutaneous heparin ID - Rocephin per primary   I reviewed nursing notes, last 24 h vitals and pain scores, last 48 h intake and output, last 24 h labs and trends, and last 24 h imaging results.  Jacinto Halim, [facility] 06/03/2023, 11:57 AM Please see Amion for pager number during day hours 7:00am-4:30pm

## 2023-06-03 NOTE — Progress Notes (Signed)
Physical Therapy Treatment Patient Details Name: Katherine Cook MRN: [id number] DOB: [birth year] Today's Date: 06/03/2023   History of Present Illness 82 y.o. female admitted 05/20/23 after fall at home, down for 10-12 hrs. Pt with rhabdomyolysis, AKI on CKD. Course complicated by LLE weakness; negative CVA; MRI showed thoracic disc herniation with cord signal change. S/p T11-T12 transpedicular discectomy 8/16. PMH includes DM2, neuropathy, HTN, HLD, CKD, L foot fx (08/2022).    PT Comments  Pt incontinent of bowel/bladder with linens soiled on arrival. Pt continues to report pain with all movement, fear of falling and self-limiting behavior. Mobility limited to rolling and repositioning in bed due to pt behavior, lack of assist and pain. Maximove for OOB. PT also encouraged to self feed as tray in front of her and pt not making active effort to feed herself. Will continue follow.     If plan is discharge home, recommend the following: Two people to help with walking and/or transfers;Two people to help with bathing/dressing/bathroom   Can travel by private vehicle     No  Equipment Recommendations  Wheelchair (measurements PT);Wheelchair cushion (measurements PT);Hospital bed;Other (comment) (hoyer)    Recommendations for Other Services       Precautions / Restrictions Precautions Precautions: Fall;Back Precaution Comments: pt educated for precautions but no apparent recall or carryover     Mobility  Bed Mobility Overal bed mobility: Needs Assistance Bed Mobility: Rolling Rolling: Max assist         General bed mobility comments: max assist to roll bil x 2 each direction for pericare and pad change. Pt screaming throughout despite medication and even when not moving and pt with very limited effort to bend knees or reach to roll. Unable to progress to EOB today. pt did assist with scooting to [facility] with head board and trendelenburg mod assist    Transfers                    General transfer comment: unable    Ambulation/Gait                   Stairs             Wheelchair Mobility     Tilt Bed    Modified Rankin (Stroke Patients Only)       Balance Overall balance assessment: Needs assistance                                          Cognition Arousal: Alert Behavior During Therapy: Flat affect Overall Cognitive Status: No family/caregiver present to determine baseline cognitive functioning Area of Impairment: Orientation, Following commands, Safety/judgement                 Orientation Level: Disoriented to, Time     Following Commands: Follows one step commands with increased time, Follows one step commands inconsistently Safety/Judgement: Decreased awareness of deficits, Decreased awareness of safety     General Comments: very self-limiting will report pain when not moving, fearful of HOB being moved at all despite need for supine to assist with pericare. pt incontinent of bladder with large mass of stool at rectal vault and pt unaware. Pt requires max multimodal cues with increased time and very limited adherence to cues        Exercises      General Comments        Pertinent  Vitals/Pain Pain Assessment Pain Score: 6  Pain Location: generalized back and legs with all movement Pain Descriptors / Indicators: Grimacing, Guarding, Crying Pain Intervention(s): Limited activity within patient's tolerance, Repositioned, Monitored during session, Premedicated before session    Home Living                          Prior Function            PT Goals (current goals can now be found in the care plan section) Progress towards PT goals: Not progressing toward goals - comment    Frequency    Min 1X/week      PT Plan      Co-evaluation              AM-PAC PT "6 Clicks" Mobility   Outcome Measure  Help needed turning from your back to your side while in a flat bed  without using bedrails?: Total Help needed moving from lying on your back to sitting on the side of a flat bed without using bedrails?: Total Help needed moving to and from a bed to a chair (including a wheelchair)?: Total Help needed standing up from a chair using your arms (e.g., wheelchair or bedside chair)?: Total Help needed to walk in hospital room?: Total Help needed climbing 3-5 steps with a railing? : Total 6 Click Score: 6    End of Session   Activity Tolerance: Patient limited by pain Patient left: with call bell/phone within reach;in bed;with bed alarm set;with nursing/sitter in room Nurse Communication: Mobility status;Need for lift equipment PT Visit Diagnosis: Other abnormalities of gait and mobility (R26.89);Muscle weakness (generalized) (M62.81)     Time: 1610-9604 PT Time Calculation (min) (ACUTE ONLY): 16 min  Charges:    $Therapeutic Activity: 8-22 mins PT General Charges $$ ACUTE PT VISIT: 1 Visit                     Merryl Hacker, PT Acute Rehabilitation Services Office: [phone]    Cristine Polio 06/03/2023, 11:33 AM

## 2023-06-03 NOTE — TOC Progression Note (Signed)
Transition of Care [facility]) - Progression Note    Patient Details  Name: Katherine Cook MRN: [id number] Date of Birth: 1941/02/25  [facility] of [facility] CM/SW Contact  Keta [place] A [place], [place] Phone Number: 06/03/2023, 4:05 PM  Clinical Narrative:     CSW notified facility, [facility], pt not medically stable for DC.   Pt's insurance [employer] has expired.   Reference ID [id number] Auth ID [id number] Dates:   06/01/2023-06/03/2023   Needs new auth request once DC date has been determined.   TOC will continue to follow.   Expected Discharge Plan: Skilled Nursing Facility Barriers to Discharge: Barriers Resolved  Expected Discharge Plan and Services In-house Referral: Clinical Social Work     Living arrangements for the past 2 months: Single Family Home                                       Social Determinants of Health (SDOH) Interventions SDOH Screenings   Food Insecurity: No Food Insecurity (05/21/2023)  Housing: Low Risk  (05/21/2023)  Transportation Needs: No Transportation Needs (05/21/2023)  Utilities: Not At Risk (05/21/2023)  Tobacco Use: Low Risk  (05/27/2023)    Readmission Risk Interventions     No data to display

## 2023-06-04 DIAGNOSIS — M6282 Rhabdomyolysis: Secondary | ICD-10-CM | POA: Diagnosis not present

## 2023-06-04 DIAGNOSIS — N179 Acute kidney failure, unspecified: Secondary | ICD-10-CM | POA: Diagnosis not present

## 2023-06-04 DIAGNOSIS — W19XXXA Unspecified fall, initial encounter: Secondary | ICD-10-CM | POA: Diagnosis not present

## 2023-06-04 DIAGNOSIS — E860 Dehydration: Secondary | ICD-10-CM | POA: Diagnosis not present

## 2023-06-04 LAB — CBC WITH DIFFERENTIAL/PLATELET
Abs Immature Granulocytes: 0.07 10*3/uL (ref 0.00–0.07)
Basophils Absolute: 0.1 10*3/uL (ref 0.0–0.1)
Basophils Relative: 1 %
Eosinophils Absolute: 0.7 10*3/uL — ABNORMAL HIGH (ref 0.0–0.5)
Eosinophils Relative: 6 %
HCT: 24.4 % — ABNORMAL LOW (ref 36.0–46.0)
Hemoglobin: 8 g/dL — ABNORMAL LOW (ref 12.0–15.0)
Immature Granulocytes: 1 %
Lymphocytes Relative: 20 %
Lymphs Abs: 2.5 10*3/uL (ref 0.7–4.0)
MCH: 29.7 pg (ref 26.0–34.0)
MCHC: 32.8 g/dL (ref 30.0–36.0)
MCV: 90.7 fL (ref 80.0–100.0)
Monocytes Absolute: 1.1 10*3/uL — ABNORMAL HIGH (ref 0.1–1.0)
Monocytes Relative: 9 %
Neutro Abs: 7.8 10*3/uL — ABNORMAL HIGH (ref 1.7–7.7)
Neutrophils Relative %: 63 %
Platelets: 329 10*3/uL (ref 150–400)
RBC: 2.69 MIL/uL — ABNORMAL LOW (ref 3.87–5.11)
RDW: 14.8 % (ref 11.5–15.5)
WBC: 12.2 10*3/uL — ABNORMAL HIGH (ref 4.0–10.5)
nRBC: 0 % (ref 0.0–0.2)

## 2023-06-04 LAB — MAGNESIUM: Magnesium: 2.2 mg/dL (ref 1.7–2.4)

## 2023-06-04 LAB — COMPREHENSIVE METABOLIC PANEL
ALT: 72 U/L — ABNORMAL HIGH (ref 0–44)
AST: 38 U/L (ref 15–41)
Albumin: 1.9 g/dL — ABNORMAL LOW (ref 3.5–5.0)
Alkaline Phosphatase: 120 U/L (ref 38–126)
Anion gap: 9 (ref 5–15)
BUN: 28 mg/dL — ABNORMAL HIGH (ref 8–23)
CO2: 25 mmol/L (ref 22–32)
Calcium: 9 mg/dL (ref 8.9–10.3)
Chloride: 108 mmol/L (ref 98–111)
Creatinine, Ser: 1.64 mg/dL — ABNORMAL HIGH (ref 0.44–1.00)
GFR, Estimated: 31 mL/min — ABNORMAL LOW (ref >60)
Glucose, Bld: 125 mg/dL — ABNORMAL HIGH (ref 70–99)
Potassium: 4.9 mmol/L (ref 3.5–5.1)
Sodium: 142 mmol/L (ref 135–145)
Total Bilirubin: 0.8 mg/dL (ref 0.3–1.2)
Total Protein: 5.7 g/dL — ABNORMAL LOW (ref 6.5–8.1)

## 2023-06-04 LAB — GLUCOSE, CAPILLARY
Glucose-Capillary: 102 mg/dL — ABNORMAL HIGH (ref 70–99)
Glucose-Capillary: 142 mg/dL — ABNORMAL HIGH (ref 70–99)
Glucose-Capillary: 180 mg/dL — ABNORMAL HIGH (ref 70–99)

## 2023-06-04 LAB — CULTURE, BLOOD (ROUTINE X 2)
Culture: NO GROWTH
Culture: NO GROWTH
Special Requests: ADEQUATE
Special Requests: ADEQUATE

## 2023-06-04 LAB — PHOSPHORUS: Phosphorus: 4.2 mg/dL (ref 2.5–4.6)

## 2023-06-04 NOTE — Progress Notes (Signed)
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  When she arrived to the ED she was febrile 100.8 and labs showed an AKI as well as abnormal LFTs and rhabdomyolysis as well as subsequent leukocytosis.  Imaging was done and she had a head CT, CT C-spine, CT maxillofacial CTA as well as CT of the chest abdomen pelvis which showed no acute finding but did show a thyroid abnormality and ultrasound was recommended.  She was also found to have a nondisplaced femoral neck fracture however a right femoral neck 3 view x-ray was recommended but did not show any fracture.  She subsequently admitted for rhabdomyolysis and AKI on CKD stage IIIb and placed on IV fluids.  Given this she also had noted that she had some cystic changes in the left foot back in November after immobilizer was placed for traumatic left foot fracture and had progressed to the entire leg with worsening and left lower extremity weakness.  MRI was done and showed no evidence of acute stroke but she was seen by neurology underwent MRI of C-spine and T-spine on the MRI thoracic spine showed disc herniation with cord signal changes in the cervical stenosis with cord signal change.  Neurosurgery was consulted and recommended thoracic discectomy.  She underwent T11-T12 transpedicular thoracic discectomy with Dr. Johnsie Cancel on  05/27/2023.    PT OT recommending SNF however her WBC is now remains elevated and will need to ensure a trend back down prior to safe discharging.  Further workup has been done and currently blood cultures are negative and likely reactive but upon further review she had a Temperature on 8/18.  LFTs also trended up now again so we will check an acute hepatitis panel and a right quad ultrasound and it showed "Distended gallbladder with wall thickening. Shadowing stones and sludge. Please correlate with any clinical evidence of acute cholecystitis. If there is further concern of acute cholecystitis, HIDA scan may be useful for further delineation. No separate biliary ductal dilatation."  Given her findings on the right upper quadrant ultrasound will get a HIDA scan and involve general surgery and initiate antibiotics given concern for possible acute cholecystitis.  If cholecystitis is ruled out surgery will sign off however if it is ruled and they plan for surgical intervention likely a laparoscopic cholecystectomy as early as Saturday or Sunday.  Given this antibiotics were initiated.  There was no acute cholecystitis noted on the HIDA scan and WBC continue to be slightly elevated.  Anticipate discharge on Monday.  Assessment and Plan:  Acute Kidney Injury superimposed on CKD stage 3b, improving Metabolic Acidosis -BUN/Cr Trend: Recent Labs  Lab 05/29/23 0543 05/30/23 0418 06/01/23 0155 06/01/23 1610 06/02/23 0022 06/03/23 0617 06/04/23 0404  BUN 34* 36* 34* 35* 35* 31* 28*  CREATININE 2.27* 2.24* 1.77* 1.83* 1.73* 1.85* 1.64*  -Avoid Nephrotoxic Medications, Contrast Dyes, Hypotension and Dehydration to  Ensure Adequate Renal Perfusion and will need to Renally Adjust Meds -Patient had a slight metabolic acidosis with a CO2 of 21, anion gap of 9, chloride level of 111; Patient's Acidosis has improved and CO2 is 25, anion gap is 9, chloride 108 -Continue to Monitor and Trend Renal Function carefully  and repeat CMP in the AM  -Encourage oral fluid intake and continue to Follow Urine output Avoid nephrotoxic agents, follow-up urine output   Mild Hyperkalemia, improved -Resolved with Lokelma -Patient's K+ Level Trend: Recent Labs  Lab 05/29/23 0543 05/30/23 0418 06/01/23 0155 06/01/23 1610 06/02/23 0022 06/03/23 0617 06/04/23 0404  K 5.2* 4.7 4.4 4.8 4.6 4.7 4.9  -Continue to Monitor and Replete as Necessary -Repeat CMP in the AM    Rhabdomyolysis status post fall and prolonged pressure injury. -CPK levels trending down, repeat CK 246.  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 Maurice Small on 05/27/23 -Pain control as needed -Postop care per neurosurgery recommendations-will resume Lovenox later as recommended by neurosurgery -As per neurosurgery--will likely need rehab  -PT/OT on board and recommending SNF and anticipating discharge on Monday   Chest Pain -Resolved. -EKG showed normal sinus rhythm heart rate 76.  -No ST and T wave abnormality. Troponin 16.  -Denies any chest pain.  -Ruled out ACS.    Essential Hypertension, Moderately controlled -C/w IV Hydralazine 5 mg q6hprn SBP >160 -Continue to Monitor BP per Protocol -Last BP reading was elevated at 142/61   Hyperlipidemia -Rosuvastatin was initially held due to rhabdomyolysis and Abnormal LFTs -Initially Resumed home statin with Rosuvastatin 40 mg po Daily but holding again given Abnormal LFTs   Insulin Dependent Type 2 Diabetes Mellitus (HCC) -Hemoglobin A1c 6.4 -C/w Carbohydrate modified diet. -Discontinued Levemir 10 units twice daily -C/w Very Sensitive Novolog SSI AC -Continue to Monitor and Trend CBG's and Glucose Levels: Recent Labs  Lab 06/02/23 1639 06/02/23 2026 06/03/23 0737 06/03/23 1750 06/03/23 2114 06/04/23 0742 06/04/23 1210  GLUCAP 141* 162* 120* 134* 164*  102* 142*   Recent Labs  Lab 05/29/23 0543 05/30/23 0418 06/01/23 0155 06/01/23 1610 06/02/23 0022 06/03/23 0617 06/04/23 0404  GLUCOSE 136* 172* 147* 183* 151* 148* 125*    Leukocytosis and Fever in the setting of ? Acute Cholecystitis, Acute Cholecystitis ruled out and ? Chronic Cholecystitis; fever is resolved with WBC continues to still be elevated -Had a Temperature on 8/18 and was almost 103 Degrees Farenheit  -Unclear Etiology of leukocytosis but ? Reactive and fluctuating Recent Labs  Lab 05/27/23 1213 05/30/23 0418 06/01/23 0155 06/01/23 1610 06/02/23 0022 06/03/23 0617 06/04/23 0404  WBC 15.0* 14.5* 14.4* 12.0* 11.9* 11.5* 12.2*  -U/A Negative  -Blood Cx x2 done and showed NGTD at 5 Days  -RUQ U/S done and showed "Distended gallbladder with wall thickening. Shadowing stones and sludge. Please correlate with any clinical evidence of acute cholecystitis. If there is further concern of acute cholecystitis, HIDA scan may be useful for further delineation. No separate biliary ductal dilatation" -Obtained HIDA Scan and showed "No common duct or cystic duct obstruction. There is delayed visualization of the gallbladder however. Please correlate for etiology including chronic cholecystitis."  -Add IV Abx with Ceftriaxone and Metronidazole and will continue  -Repeat CXR in the AM -Continue to Monitor for S/Sx of Infection -General Surgery consulted for further evaluation and recommendations and are recommending following up on the HIDA scan if the  HIDA did not show any evidence of acute cholecystitis we will plan to sign off but if the HIDA shows evidence of acute cholecystitis there is no plan for surgical intervention with a laparoscopic cholecystectomy likely as early as Saturday or Sunday; Since HIDA did not show Acute Cholecystitis, the Surgery Team has signed off the case.   Normocytic Anemia -Hgb/Hct Trend: Recent Labs  Lab 05/27/23 1213 05/30/23 0418 06/01/23 0155  06/01/23 1610 06/02/23 0022 06/03/23 0617 06/04/23 0404  HGB 10.2* 9.1* 8.0* 8.2* 8.2* 8.6* 8.0*  HCT 31.3* 27.9* 24.3* 25.2* 25.2* 25.7* 24.4*  MCV 91.5 94.3 89.7 91.6 91.6 91.8 90.7  -Check Anemia Panel in the AM -Continue to Monitor for S/Sx of Bleeding; No overt bleeding noted -Repeat CBC in the AM    Hyperbilirubinemia  Abnormal LFTs and Elevated LFTs Recent Labs  Lab 05/23/23 0140 05/28/23 0450 05/30/23 0418 06/01/23 1610 06/02/23 0022 06/03/23 0617 06/04/23 0404  AST 44* 30 31 155* 130* 72* 38  ALT 22 24 22 112* 118* 104* 72*  BILITOT 0.7 0.7 1.3* 0.9 0.7 0.8 0.8  ALKPHOS 30* 34* 73 148* 137* 132* 120  -Will check RUQ U/S and it showed "Distended gallbladder with wall thickening. Shadowing stones and sludge. Please correlate with any clinical evidence of acute cholecystitis. If there is further concern of acute cholecystitis, HIDA scan may be useful for further delineation. No separate biliary ductal dilatation" -HIDA ordered and showed "No common duct or cystic duct obstruction. There is delayed visualization of the gallbladder however. Please correlate for etiology including chronic cholecystitis." -Acute Hepatitis Panel was negative -Continue to Monitor and Trend and repeat CMP in the AM as LFTs are now normalizing    Enlarged Thyroid -Incidental finding on CT. -Workup negative with Thyroid U/S and showed "Enlarged heterogeneous thyroid without discrete nodule." -Will need Outpatient follow-up with Endocrinology    Dehydration -Resolved  GERD/GI Prophylaxis -C/w Pantoprazole 40 mg po daily   Hypoalbuminemia -Patient\'s Albumin Trend: Recent Labs  Lab 05/23/23 0140 05/28/23 0450 05/30/23 0418 06/01/23 1610 06/02/23 0022 06/03/23 0617 06/04/23 0404  ALBUMIN 2.5* 2.7* 2.4* 2.1* 2.0* 2.1* 1.9*  -Continue to Monitor and Trend and repeat CMP in the AM  Obesity -Complicates overall prognosis and care -Estimated body mass index is 39.95 kg/m as calculated  from the following:   Height as of this encounter: 5\' 10" (1.778 m).   Weight as of this encounter: 126.3 kg.  -Weight Loss and Dietary Counseling given   DVT prophylaxis: heparin injection 5,000 Units Start: 05/29/23 2200 SCDs Start: 05/21/23 0450 Place TED hose Start: 05/21/23 0450    Code Status: Full Code Family Communication: No family present at bedside   Disposition Plan:  Level of care: Telemetry Medical Status is: Inpatient Remains inpatient appropriate because: Needs further clinical improvement in WBC to safe discharge to SNF in next 24 to 48 hours and will need a new insurance authorization for SNF on Monday   Consultants:  Neurosurgery Neurology Cardiology  Procedures:  As delineated as above  Antimicrobials:  Anti-infectives (From admission, onward)    Start     Dose/Rate Route Frequency Ordered Stop   06/03/23 0930  cefTRIAXone (ROCEPHIN) 2 g in sodium chloride 0.9 % 100 mL IVPB        2 g 200 mL/hr over 30 Minutes Intravenous Every 24 hours 06/03/23 0842     06/03/23 0930  metroNIDAZOLE (FLAGYL) IVPB 500 mg        50 0 mg 100 mL/hr over 60  Minutes Intravenous Every 12 hours 06/03/23 0842     05/27/23 0720  ceFAZolin (ANCEF) 3-0.9 GM/100ML-% IVPB       Note to Pharmacy: Camillia Herter A: cabinet override      05/27/23 0720 05/27/23 1929   05/22/23 1100  cefTRIAXone (ROCEPHIN) 1 g in sodium chloride 0.9 % 100 mL IVPB        1 g 200 mL/hr over 30 Minutes Intravenous Every 24 hours 05/22/23 1015 05/24/23 1151       Subjective: Seen and examined at bedside and she is asleep and resting.  No acute events overnight and I spoke with general surgery who feels that since she did not have acute cholecystitis they have signed off the case.  WBC continues to be elevated.  Anticipating discharging at least on Monday if WBC improves.  Objective: Vitals:   06/04/23 0500 06/04/23 0554 06/04/23 0742 06/04/23 1519  BP:  (!) 150/75 (!) 145/72 (!) 142/61  Pulse:  69 73  61  Resp:  18 19 18   Temp:  (!) 97.5 F (36.4 C) 97.6 F (36.4 C) 98.7 F (37.1 C)  TempSrc:  Oral Oral   SpO2:  97% 93% 95%  Weight: 126.3 kg     Height:        Intake/Output Summary (Last 24 hours) at 06/04/2023 1815 Last data filed at 06/04/2023 1517 Gross per 24 hour  Intake 815.83 ml  Output 900 ml  Net -84.17 ml   Filed Weights   06/02/23 0500 06/03/23 0538 06/04/23 0500  Weight: 125.9 kg 126.5 kg 126.3 kg   Examination: Physical Exam:  Constitutional: WN/WD obese African-American female in no acute distress appears calm and asleep Respiratory: Diminished to auscultation bilaterally, no wheezing, rales, rhonchi or crackles. Normal respiratory effort and patient is not tachypenic. No accessory muscle use.  Unlabored breathing Cardiovascular: RRR, no murmurs / rubs / gallops. S1 and S2 auscultated. No extremity edema.  Abdomen: Soft, non-tender, distended secondary to body habitus. Bowel sounds positive.  GU: Deferred. Musculoskeletal: No clubbing / cyanosis of digits/nails. No joint deformity upper and lower extremities.  Skin: No rashes, lesions, ulcers limited skin evaluation. No induration; Warm and dry.  Neurologic: She is asleep and did not wake up for the encounter given her deep sleep  Data Reviewed: I have personally reviewed following labs and imaging studies  CBC: Recent Labs  Lab 06/01/23 0155 06/01/23 1610 06/02/23 0022 06/03/23 0617 06/04/23 0404  WBC 14.4* 12.0* 11.9* 11.5* 12.2*  NEUTROABS  --  8.7* 8.3* 8.2* 7.8*  HGB 8.0* 8.2* 8.2* 8.6* 8.0*  HCT 24.3* 25.2* 25.2* 25.7* 24.4*  MCV 89.7 91.6 91.6 91.8 90.7  PLT 222 232 242 293 329   Basic Metabolic Panel: Recent Labs  Lab 06/01/23 0155 06/01/23 1610 06/02/23 0022 06/03/23 0617 06/04/23 0404  NA 138 143 141 141 142  K 4.4 4.8 4.6 4.7 4.9  CL 108 108 111 108 108  CO2 23 22 21* 25 25  GLUCOSE 147* 183* 151* 148* 125*  BUN 34* 35* 35* 31* 28*  CREATININE 1.77* 1.83* 1.73* 1.85* 1.64*   CALCIUM 8.6* 9.0 8.7* 8.9 9.0  MG  --  2.3 2.3 2.2 2.2  PHOS  --  3.6 3.6 4.1 4.2   GFR: Estimated Creatinine Clearance: 38.9 mL/min (A) (by C-G formula based on SCr of 1.64 mg/dL (H)). Liver Function Tests: Recent Labs  Lab 05/30/23 0418 06/01/23 1610 06/02/23 0022 06/03/23 0617 06/04/23 0404  AST 31 155* 130* 72* 38  ALT 22 112* 118* 104* 72*  ALKPHOS 73 148* 137* 132* 120  BILITOT 1.3* 0.9 0.7 0.8 0.8  PROT 6.5 5.9* 5.7* 6.1* 5.7*  ALBUMIN 2.4* 2.1* 2.0* 2.1* 1.9*   No results for input(s): "LIPASE", "AMYLASE" in the last 168 hours. No results for input(s): "AMMONIA" in the last 168 hours. Coagulation Profile: No results for input(s): "INR", "PROTIME" in the last 168 hours. Cardiac Enzymes: No results for input(s): "CKTOTAL", "CKMB", "CKMBINDEX", "TROPONINI" in the last 168 hours. BNP (last 3 results) No results for input(s): "PROBNP" in the last 8760 hours. HbA1C: No results for input(s): "HGBA1C" in the last 72 hours. CBG: Recent Labs  Lab 06/03/23 0737 06/03/23 1750 06/03/23 2114 06/04/23 0742 06/04/23 1210  GLUCAP 120* 134* 164* 102* 142*   Lipid Profile: No results for input(s): "CHOL", "HDL", "LDLCALC", "TRIG", "CHOLHDL", "LDLDIRECT" in the last 72 hours. Thyroid Function Tests: No results for input(s): "TSH", "T4TOTAL", "FREET4", "T3FREE", "THYROIDAB" in the last 72 hours. Anemia Panel: No results for input(s): "VITAMINB12", "FOLATE", "FERRITIN", "TIBC", "IRON", "RETICCTPCT" in the last 72 hours. Sepsis Labs: No results for input(s): "PROCALCITON", "LATICACIDVEN" in the last 168 hours.  Recent Results (from the past 240 hour(s))  Surgical pcr screen     Status: None   Collection Time: 05/27/23  6:51 AM   Specimen: Nasal Mucosa; Nasal Swab  Result Value Ref Range Status   MRSA, PCR NEGATIVE NEGATIVE Final   Staphylococcus aureus NEGATIVE NEGATIVE Final    Comment: (NOTE) The Xpert SA Assay (FDA approved for NASAL specimens in patients 22 years of  age and older), is one component of a comprehensive surveillance program. It is not intended to diagnose infection nor to guide or monitor treatment. Performed at [facility], [address] [place], [place] [address]   Culture, blood (Routine X 2) w Reflex to ID Panel     Status: None   Collection Time: 05/30/23  4:18 AM   Specimen: BLOOD RIGHT HAND  Result Value Ref Range Status   Specimen Description BLOOD RIGHT HAND  Final   Special Requests   Final    BOTTLES DRAWN AEROBIC AND ANAEROBIC Blood Culture adequate volume   Culture   Final    NO GROWTH 5 DAYS Performed at [facility], [address], [place], [place] [address]    Report Status 06/04/2023 FINAL  Final  Culture, blood (Routine X 2) w Reflex to ID Panel     Status: None   Collection Time: 05/30/23  4:21 AM   Specimen: BLOOD RIGHT ARM  Result Value Ref Range Status   Specimen Description BLOOD RIGHT ARM  Final   Special Requests   Final    BOTTLES DRAWN AEROBIC AND ANAEROBIC Blood Culture adequate volume   Culture   Final    NO GROWTH 5 DAYS Performed at [facility], [address], [place], [place] [address]    Report Status 06/04/2023 FINAL  Final     Radiology Studies: NM Hepatobiliary Liver Func  Result Date: 06/03/2023 CLINICAL DATA:  Cholelithiasis. EXAM: NUCLEAR MEDICINE HEPATOBILIARY IMAGING TECHNIQUE: Sequential images of the abdomen were obtained out to 60 minutes following intravenous administration of radiopharmaceutical. A second set of images were obtained after the first with a slight delay and a second injection and morphine. RADIOPHARMACEUTICALS:  5.4 mCi Tc-77m Choletec IV initially. Second injection 1 millicurie. Subsequent administration of IV morphine. Please correlate with IV morphine. Please correlate with specific nodes from technologist for exact  dose of morphine. COMPARISON:  Ultrasound 06/02/2023.  CT May 20, 2023 FINDINGS: Prompt uptake and biliary excretion of  activity by the liver is seen. Biliary activity passes into small bowel, consistent with patent common bile duct. Initially gallbladder activity was not seen. After additional dosing due to the level of counts and subsequently IV morphine gallbladder activity is visible but faint. IMPRESSION: No common duct or cystic duct obstruction. There is delayed visualization of the gallbladder however. Please correlate for etiology including chronic cholecystitis. Electronically Signed   By: Karen Kays M.D.   On: 06/03/2023 17:40    Scheduled Meds:  ascorbic acid  1,000 mg Oral Daily   aspirin EC  81 mg Oral Daily   donepezil  10 mg Oral Daily   gabapentin  100 mg Oral BID   heparin  5,000 Units Subcutaneous Q12H   insulin aspart  0-6 Units Subcutaneous TID WC   melatonin  3 mg Oral QHS   pantoprazole  40 mg Oral Daily   polyethylene glycol  17 g Oral Daily   sodium chloride flush  3 mL Intravenous Q12H   Continuous Infusions:  sodium chloride     cefTRIAXone (ROCEPHIN)  IV Stopped (06/04/23 1034)   metronidazole Stopped (06/04/23 1232)    LOS: 14 days   Marguerita Merles, DO [facility] Available via Epic secure chat 7am-7pm After these hours, please refer to coverage provider listed on [email or web address] 06/04/2023, 6:15 PM

## 2023-06-05 ENCOUNTER — Inpatient Hospital Stay (HOSPITAL_COMMUNITY): Payer: Medicare HMO

## 2023-06-05 DIAGNOSIS — M6282 Rhabdomyolysis: Secondary | ICD-10-CM | POA: Diagnosis not present

## 2023-06-05 DIAGNOSIS — E860 Dehydration: Secondary | ICD-10-CM | POA: Diagnosis not present

## 2023-06-05 DIAGNOSIS — W19XXXA Unspecified fall, initial encounter: Secondary | ICD-10-CM | POA: Diagnosis not present

## 2023-06-05 DIAGNOSIS — R509 Fever, unspecified: Secondary | ICD-10-CM

## 2023-06-05 DIAGNOSIS — N179 Acute kidney failure, unspecified: Secondary | ICD-10-CM | POA: Diagnosis not present

## 2023-06-05 LAB — FERRITIN: Ferritin: 341 ng/mL — ABNORMAL HIGH (ref 11–307)

## 2023-06-05 LAB — COMPREHENSIVE METABOLIC PANEL
ALT: 61 U/L — ABNORMAL HIGH (ref 0–44)
AST: 31 U/L (ref 15–41)
Albumin: 2.1 g/dL — ABNORMAL LOW (ref 3.5–5.0)
Alkaline Phosphatase: 111 U/L (ref 38–126)
Anion gap: 13 (ref 5–15)
BUN: 30 mg/dL — ABNORMAL HIGH (ref 8–23)
CO2: 24 mmol/L (ref 22–32)
Calcium: 9.3 mg/dL (ref 8.9–10.3)
Chloride: 106 mmol/L (ref 98–111)
Creatinine, Ser: 1.73 mg/dL — ABNORMAL HIGH (ref 0.44–1.00)
GFR, Estimated: 29 mL/min — ABNORMAL LOW (ref >60)
Glucose, Bld: 136 mg/dL — ABNORMAL HIGH (ref 70–99)
Potassium: 4.9 mmol/L (ref 3.5–5.1)
Sodium: 143 mmol/L (ref 135–145)
Total Bilirubin: 0.8 mg/dL (ref 0.3–1.2)
Total Protein: 6.3 g/dL — ABNORMAL LOW (ref 6.5–8.1)

## 2023-06-05 LAB — CBC WITH DIFFERENTIAL/PLATELET
Abs Immature Granulocytes: 0.06 10*3/uL (ref 0.00–0.07)
Basophils Absolute: 0.1 10*3/uL (ref 0.0–0.1)
Basophils Relative: 1 %
Eosinophils Absolute: 0.6 10*3/uL — ABNORMAL HIGH (ref 0.0–0.5)
Eosinophils Relative: 5 %
HCT: 27.9 % — ABNORMAL LOW (ref 36.0–46.0)
Hemoglobin: 8.9 g/dL — ABNORMAL LOW (ref 12.0–15.0)
Immature Granulocytes: 1 %
Lymphocytes Relative: 18 %
Lymphs Abs: 2.3 10*3/uL (ref 0.7–4.0)
MCH: 28.9 pg (ref 26.0–34.0)
MCHC: 31.9 g/dL (ref 30.0–36.0)
MCV: 90.6 fL (ref 80.0–100.0)
Monocytes Absolute: 1.1 10*3/uL — ABNORMAL HIGH (ref 0.1–1.0)
Monocytes Relative: 8 %
Neutro Abs: 9 10*3/uL — ABNORMAL HIGH (ref 1.7–7.7)
Neutrophils Relative %: 67 %
Platelets: 373 10*3/uL (ref 150–400)
RBC: 3.08 MIL/uL — ABNORMAL LOW (ref 3.87–5.11)
RDW: 14.8 % (ref 11.5–15.5)
WBC: 13.1 10*3/uL — ABNORMAL HIGH (ref 4.0–10.5)
nRBC: 0 % (ref 0.0–0.2)

## 2023-06-05 LAB — IRON AND TIBC
Iron: 20 ug/dL — ABNORMAL LOW (ref 28–170)
Saturation Ratios: 13 % (ref 10.4–31.8)
TIBC: 153 ug/dL — ABNORMAL LOW (ref 250–450)
UIBC: 133 ug/dL

## 2023-06-05 LAB — RETICULOCYTES
Immature Retic Fract: 29.8 % — ABNORMAL HIGH (ref 2.3–15.9)
RBC.: 3.07 MIL/uL — ABNORMAL LOW (ref 3.87–5.11)
Retic Count, Absolute: 72.5 10*3/uL (ref 19.0–186.0)
Retic Ct Pct: 2.4 % (ref 0.4–3.1)

## 2023-06-05 LAB — PHOSPHORUS: Phosphorus: 4.4 mg/dL (ref 2.5–4.6)

## 2023-06-05 LAB — GLUCOSE, CAPILLARY
Glucose-Capillary: 133 mg/dL — ABNORMAL HIGH (ref 70–99)
Glucose-Capillary: 154 mg/dL — ABNORMAL HIGH (ref 70–99)
Glucose-Capillary: 128 mg/dL — ABNORMAL HIGH (ref 70–99)
Glucose-Capillary: 125 mg/dL — ABNORMAL HIGH (ref 70–99)

## 2023-06-05 LAB — MAGNESIUM: Magnesium: 2.1 mg/dL (ref 1.7–2.4)

## 2023-06-05 LAB — FOLATE: Folate: 18.2 ng/mL (ref >5.9)

## 2023-06-05 LAB — VITAMIN B12: Vitamin B-12: 909 pg/mL (ref 180–914)

## 2023-06-05 NOTE — Progress Notes (Addendum)
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  When she arrived to the ED she was febrile 100.8 and labs showed an AKI as well as abnormal LFTs and rhabdomyolysis as well as subsequent leukocytosis.  Imaging was done and she had a head CT, CT C-spine, CT maxillofacial CTA as well as CT of the chest abdomen pelvis which showed no acute finding but did show a thyroid abnormality and ultrasound was recommended.  She was also found to have a nondisplaced femoral neck fracture however a right femoral neck 3 view x-ray was recommended but did not show any fracture.  She subsequently admitted for rhabdomyolysis and AKI on CKD stage IIIb and placed on IV fluids.  Given this she also had noted that she had some cystic changes in the left foot back in November after immobilizer was placed for traumatic left foot fracture and had progressed to the entire leg with worsening and left lower extremity weakness.  MRI was done and showed no evidence of acute stroke but she was seen by neurology underwent MRI of C-spine and T-spine on the MRI thoracic spine showed disc herniation with cord signal changes in the cervical stenosis with cord signal change.  Neurosurgery was consulted and recommended thoracic discectomy.  She underwent T11-T12 transpedicular thoracic discectomy with Dr. Johnsie Cancel on  05/27/2023.    PT OT recommending SNF however her WBC is now remains elevated and will need to ensure a trend back down prior to safe discharging.  Further workup has been done and currently blood cultures are negative and likely reactive but upon further review she had a Temperature on 8/18.  LFTs also trended up now again so we will check an acute hepatitis panel and a right quad ultrasound and it showed "Distended gallbladder with wall thickening. Shadowing stones and sludge. Please correlate with any clinical evidence of acute cholecystitis. If there is further concern of acute cholecystitis, HIDA scan may be useful for further delineation. No separate biliary ductal dilatation."    Given her findings on the right upper quadrant ultrasound will get a HIDA scan and involve general surgery and initiate antibiotics given concern for possible acute cholecystitis.  If cholecystitis is ruled out surgery will sign off however if it is ruled and they plan for surgical intervention likely a laparoscopic cholecystectomy as early as Saturday or Sunday.  Abx were initiated here was no acute cholecystitis noted on the HIDA scan and WBC continue to be slightly elevated and had another fever early this AM.  Will repeat blood cultures and chest x-ray continue to monitor for signs and symptoms of infection.  Patient thinks she is doing better now.  Will need to ensure that her leukocytosis and fever resolved prior to discharging.    Assessment and Plan:  Acute Kidney Injury superimposed on CKD stage 3b, improving Metabolic Acidosis -BUN/Cr Trend: Recent Labs  Lab  05/30/23 0418 06/01/23 0155 06/01/23 1610 06/02/23 0022 06/03/23 0617 06/04/23 0404 06/05/23 0435  BUN 36* 34* 35* 35* 31* 28* 30*  CREATININE 2.24* 1.77* 1.83* 1.73* 1.85* 1.64* 1.73*  -Avoid Nephrotoxic Medications, Contrast Dyes, Hypotension and Dehydration to Ensure Adequate Renal Perfusion and will need to Renally Adjust Meds -Patient had a  slight metabolic acidosis but Acidosis has improved and CO2 is now 24, anion gap is 13, chloride 106 -Continue to Monitor and Trend Renal Function carefully and repeat CMP in the AM  -Encourage oral fluid intake and continue to Follow Urine output Avoid nephrotoxic agents, follow-up urine output   Mild Hyperkalemia, improved -Resolved with Lokelma -Patient's K+ Level Trend: Recent Labs  Lab 05/30/23 0418 06/01/23 0155 06/01/23 1610 06/02/23 0022 06/03/23 0617 06/04/23 0404 06/05/23 0435  K 4.7 4.4 4.8 4.6 4.7 4.9 4.9  -Continue to Monitor and Replete as Necessary -Repeat CMP in the AM    Rhabdomyolysis status post fall and prolonged pressure injury. -CPK levels trending down, repeat CK 246.  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 Maurice Small on 05/27/23 -Pain control as needed -Postop care per neurosurgery recommendations-will resume Lovenox later as recommended by neurosurgery -As per neurosurgery--will likely need rehab  -PT/OT on board and recommending SNF and anticipating discharge on Monday   Chest Pain -Resolved. -EKG showed normal sinus rhythm heart rate 76.  -No ST and T wave abnormality. Troponin 16.  -Denies any chest pain.  -Ruled out ACS.    Essential Hypertension, Moderately controlled -C/w IV Hydralazine 5 mg q6hprn SBP >160 -Continue to Monitor BP per Protocol -Last BP reading was elevated at 121/55   Hyperlipidemia -Rosuvastatin was initially held due to rhabdomyolysis and Abnormal LFTs -Initially Resumed home statin with Rosuvastatin 40 mg po Daily but holding again given Abnormal LFTs and likely will resume at D/C   Insulin Dependent Type 2 Diabetes Mellitus (HCC) -Hemoglobin A1c 6.4 -C/w Carbohydrate modified diet. -Discontinued Levemir 10 units twice daily -C/w Very Sensitive Novolog SSI AC -Continue to Monitor and  Trend CBG's and Glucose Levels: Recent Labs  Lab 06/03/23 2114 06/04/23 0742 06/04/23 1210 06/04/23 2006 06/05/23 0749 06/05/23 1230 06/05/23 1641  GLUCAP 164* 102* 142* 180* 133* 154* 128*   Recent Labs  Lab 05/30/23 0418 06/01/23 0155 06/01/23 1610 06/02/23 0022 06/03/23 0617 06/04/23 0404 06/05/23 0435  GLUCOSE 172* 147* 183* 151* 148* 125* 136*    Leukocytosis and Fever; Persistent and Intermittent; ? Related as Acute Cholecystitis ruled out and ? Chronic Cholecystitis;  -Had a Temperature on 8/18 and was almost 103 Degrees Farenheit and had a repeat temperature of 100.7 on 06/04/2023 -Unclear Etiology of leukocytosis but ? Reactive and fluctuating Recent Labs  Lab 05/30/23 0418 06/01/23 0155 06/01/23 1610 06/02/23 0022 06/03/23 0617 06/04/23 0404 06/05/23 0435  WBC 14.5* 14.4* 12.0* 11.9* 11.5* 12.2* 13.1*  -U/A Negative  -Blood Cx x2 done and showed NGTD at 5 Days; will repeat blood cultures again given fever and persistent leukocytosis -RUQ U/S done and showed "Distended gallbladder with wall thickening. Shadowing stones and sludge. Please correlate with any clinical evidence of acute cholecystitis. If there is further concern of acute cholecystitis, HIDA scan may be useful for further delineation. No separate biliary ductal dilatation" -Obtained HIDA Scan and showed "No common duct or cystic duct obstruction. There is delayed visualization of the gallbladder however. Please correlate for etiology including chronic cholecystitis."  -  Add IV Abx with Ceftriaxone and Metronidazole and will continue  -Repeat CXR done and showed "Diffuse interstitial prominence and widespread peribronchial cuffing, concerning for an acute bronchitis. Cardiomegaly.  Aortic atherosclerosis." -Repeating U/A -Continue to Monitor for S/Sx of Infection -Remains on Abx of IV Ceftriaxone and Metronidazole  -General Surgery consulted for further evaluation and recommendations and are recommending  following up on the HIDA scan if the HIDA did not show any evidence of acute cholecystitis we will plan to sign off but if the HIDA shows evidence of acute cholecystitis there is no plan for surgical intervention with a laparoscopic cholecystectomy likely as early as Saturday or Sunday; Since HIDA did not show Acute Cholecystitis, the Surgery Team has signed off the case.   Normocytic Anemia -Hgb/Hct Trend: Recent Labs  Lab 05/30/23 0418 06/01/23 0155 06/01/23 1610 06/02/23 0022 06/03/23 0617 06/04/23 0404 06/05/23 0435  HGB 9.1* 8.0* 8.2* 8.2* 8.6* 8.0* 8.9*  HCT 27.9* 24.3* 25.2* 25.2* 25.7* 24.4* 27.9*  MCV 94.3 89.7 91.6 91.6 91.8 90.7 90.6  -Checked Anemia Panel showed an iron level of 20, UIBC 133, TIBC 153, saturation ratios of 13%, ferritin level 341, folate level 18.2, vitamin B12 level of 909 -Continue to Monitor for S/Sx of Bleeding; No overt bleeding noted -Repeat CBC in the AM    Hyperbilirubinemia, improved Abnormal LFTs and Elevated LFTs, improving Recent Labs  Lab 05/28/23 0450 05/30/23 0418 06/01/23 1610 06/02/23 0022 06/03/23 0617 06/04/23 0404 06/05/23 0435  AST 30 31 155* 130* 72* 38 31  ALT 24 22 112* 118* 104* 72* 61*  BILITOT 0.7 1.3* 0.9 0.7 0.8 0.8 0.8  ALKPHOS 34* 73 148* 137* 132* 120 111  -Will check RUQ U/S and it showed "Distended gallbladder with wall thickening. Shadowing stones and sludge. Please correlate with any clinical evidence of acute cholecystitis. If there is further concern of acute cholecystitis, HIDA scan may be useful for further delineation. No separate biliary ductal dilatation" -HIDA ordered and showed "No common duct or cystic duct obstruction. There is delayed visualization of the gallbladder however. Please correlate for etiology including chronic cholecystitis." -Acute Hepatitis Panel was negative -Continue to Monitor and Trend and repeat CMP in the AM as LFTs are now normalizing    Enlarged Thyroid -Incidental finding on  CT. -Workup negative with Thyroid U/S and showed "Enlarged heterogeneous thyroid without discrete nodule." -Will need Outpatient follow-up with Endocrinology    Dehydration -Resolved  GERD/GI Prophylaxis -C/w Pantoprazole 40 mg po daily   Hypoalbuminemia -Patient's Albumin Trend: Recent Labs  Lab 05/28/23 0450 05/30/23 0418 06/01/23 1610 06/02/23 0022 06/03/23 0617 06/04/23 0404 06/05/23 0435  ALBUMIN 2.7* 2.4* 2.1* 2.0* 2.1* 1.9* 2.1*  -Continue to Monitor and Trend and repeat CMP in the AM  Obesity -Complicates overall prognosis and care -Estimated body mass index is 39.95 kg/m as calculated from the following:   Height as of this encounter: 5\' 10"  (1.778 m).   Weight as of this encounter: 126.3 kg.  -Weight Loss and Dietary Counseling given   DVT prophylaxis: heparin injection 5,000 Units Start: 05/29/23 2200 SCDs Start: 05/21/23 0450 Place TED hose Start: 05/21/23 0450    Code Status: Full Code Family Communication: No family currently at bedside  Disposition Plan:  Level of care: Telemetry Medical Status is: Inpatient Remains inpatient appropriate because: His further clinical improvement and further evaluation of her leukocytosis and fever prior to discharging   Consultants:  General Surgery NeuroSurgery Cardiology   Procedures:  As delineated as above  Antimicrobials:  Anti-infectives (From admission, onward)    Start     Dose/Rate Route Frequency Ordered Stop   06/03/23 0930  cefTRIAXone (ROCEPHIN) 2 g in sodium chloride 0.9 % 100 mL IVPB        2 g 200 mL/hr over 30 Minutes Intravenous Every 24 hours 06/03/23 0842     06/03/23 0930  metroNIDAZOLE (FLAGYL) IVPB 500 mg        500 mg 100 mL/hr over 60 Minutes Intravenous Every 12 hours 06/03/23 0842     05/27/23 0720  ceFAZolin (ANCEF) 3-0.9 GM/100ML-% IVPB       Note to Pharmacy: Camillia Herter A: cabinet override      05/27/23 0720 05/27/23 1929   05/22/23 1100  cefTRIAXone (ROCEPHIN) 1 g in  sodium chloride 0.9 % 100 mL IVPB        1 g 200 mL/hr over 30 Minutes Intravenous Every 24 hours 05/22/23 1015 05/24/23 1151       Subjective: Seen and examined at bedside and had another fever early this morning.  States that she is feeling better though.  No chest pain or lightheadedness at rest.  Denies any shortness of breath.  States her abdominal pain is improving.  No other concerns or complaints this time.  Objective: Vitals:   06/04/23 1936 06/05/23 0541 06/05/23 0750 06/05/23 1606  BP: (!) 125/57 136/62 (!) 128/58 (!) 121/55  Pulse: 73 73 74 72  Resp: 18 18 16 16   Temp: 98.5 F (36.9 C) (!) 100.7 F (38.2 C) 98.9 F (37.2 C) 98.1 F (36.7 C)  TempSrc: Oral Oral Oral   SpO2: 91% 90% 94% (!) 87%  Weight:      Height:        Intake/Output Summary (Last 24 hours) at 06/05/2023 1749 Last data filed at 06/05/2023 1649 Gross per 24 hour  Intake 680 ml  Output 1250 ml  Net -570 ml   Filed Weights   06/02/23 0500 06/03/23 0538 06/04/23 0500  Weight: 125.9 kg 126.5 kg 126.3 kg   Examination: Physical Exam:  Constitutional: WN/WD obese African-American female who appears calm and in no acute distress Respiratory: Diminished to auscultation bilaterally with some coarse breath sounds, no wheezing, rales, rhonchi or crackles. Normal respiratory effort and patient is not tachypenic. No accessory muscle use.  Unlabored breathing Cardiovascular: RRR, no murmurs / rubs / gallops. S1 and S2 auscultated.  Mild 1+ extremity edema Abdomen: Soft, non-tender today, distended secondary to body habitus. Bowel sounds positive.  GU: Deferred. Musculoskeletal: No clubbing / cyanosis of digits/nails. No joint deformity upper and lower extremities. Skin: No rashes, lesions, ulcers limited skin evaluation. No induration; Warm and dry.  Neurologic: CN 2-12 grossly intact with no focal deficits.  Romberg sign and cerebellar reflexes not assessed.  Psychiatric: Normal judgment and insight.  She  is awake and alert and has a pleasant and calm mood  Data Reviewed: I have personally reviewed following labs and imaging studies  CBC: Recent Labs  Lab 06/01/23 1610 06/02/23 0022 06/03/23 0617 06/04/23 0404 06/05/23 0435  WBC 12.0* 11.9* 11.5* 12.2* 13.1*  NEUTROABS 8.7* 8.3* 8.2* 7.8* 9.0*  HGB 8.2* 8.2* 8.6* 8.0* 8.9*  HCT 25.2* 25.2* 25.7* 24.4* 27.9*  MCV 91.6 91.6 91.8 90.7 90.6  PLT 232 242 293 329 373   Basic Metabolic Panel: Recent Labs  Lab 06/01/23 1610 06/02/23 0022 06/03/23 0617 06/04/23 0404 06/05/23 0435  NA 143 141 141 142 143  K 4.8 4.6 4.7 4.9 4.9  CL  108 111 108 108 106  CO2 22 21* 25 25 24   GLUCOSE 183* 151* 148* 125* 136*  BUN 35* 35* 31* 28* 30*  CREATININE 1.83* 1.73* 1.85* 1.64* 1.73*  CALCIUM 9.0 8.7* 8.9 9.0 9.3  MG 2.3 2.3 2.2 2.2 2.1  PHOS 3.6 3.6 4.1 4.2 4.4   GFR: Estimated Creatinine Clearance: 36.9 mL/min (A) (by C-G formula based on SCr of 1.73 mg/dL (H)). Liver Function Tests: Recent Labs  Lab 06/01/23 1610 06/02/23 0022 06/03/23 0617 06/04/23 0404 06/05/23 0435  AST 155* 130* 72* 38 31  ALT 112* 118* 104* 72* 61*  ALKPHOS 148* 137* 132* 120 111  BILITOT 0.9 0.7 0.8 0.8 0.8  PROT 5.9* 5.7* 6.1* 5.7* 6.3*  ALBUMIN 2.1* 2.0* 2.1* 1.9* 2.1*   No results for input(s): "LIPASE", "AMYLASE" in the last 168 hours. No results for input(s): "AMMONIA" in the last 168 hours. Coagulation Profile: No results for input(s): "INR", "PROTIME" in the last 168 hours. Cardiac Enzymes: No results for input(s): "CKTOTAL", "CKMB", "CKMBINDEX", "TROPONINI" in the last 168 hours. BNP (last 3 results) No results for input(s): "PROBNP" in the last 8760 hours. HbA1C: No results for input(s): "HGBA1C" in the last 72 hours. CBG: Recent Labs  Lab 06/04/23 1210 06/04/23 2006 06/05/23 0749 06/05/23 1230 06/05/23 1641  GLUCAP 142* 180* 133* 154* 128*   Lipid Profile: No results for input(s): "CHOL", "HDL", "LDLCALC", "TRIG", "CHOLHDL",  "LDLDIRECT" in the last 72 hours. Thyroid Function Tests: No results for input(s): "TSH", "T4TOTAL", "FREET4", "T3FREE", "THYROIDAB" in the last 72 hours. Anemia Panel: Recent Labs    06/05/23 0435  VITAMINB12 909  FOLATE 18.2  FERRITIN 341*  TIBC 153*  IRON 20*  RETICCTPCT 2.4   Sepsis Labs: No results for input(s): "PROCALCITON", "LATICACIDVEN" in the last 168 hours.  Recent Results (from the past 240 hour(s))  Surgical pcr screen     Status: None   Collection Time: 05/27/23  6:51 AM   Specimen: Nasal Mucosa; Nasal Swab  Result Value Ref Range Status   MRSA, PCR NEGATIVE NEGATIVE Final   Staphylococcus aureus NEGATIVE NEGATIVE Final    Comment: (NOTE) The Xpert SA Assay (FDA approved for NASAL specimens in patients 73 years of age and older), is one component of a comprehensive surveillance program. It is not intended to diagnose infection nor to guide or monitor treatment. Performed at [facility], [address], [place], [place] [address]   Culture, blood (Routine X 2) w Reflex to ID Panel     Status: None   Collection Time: 05/30/23  4:18 AM   Specimen: BLOOD RIGHT HAND  Result Value Ref Range Status   Specimen Description BLOOD RIGHT HAND  Final   Special Requests   Final    BOTTLES DRAWN AEROBIC AND ANAEROBIC Blood Culture adequate volume   Culture   Final    NO GROWTH 5 DAYS Performed at [facility], [address] [place], [place] [address]    Report Status 06/04/2023 FINAL  Final  Culture, blood (Routine X 2) w Reflex to ID Panel     Status: None   Collection Time: 05/30/23  4:21 AM   Specimen: BLOOD RIGHT ARM  Result Value Ref Range Status   Specimen Description BLOOD RIGHT ARM  Final   Special Requests   Final    BOTTLES DRAWN AEROBIC AND ANAEROBIC Blood Culture adequate volume   Culture   Final    NO GROWTH 5 DAYS Performed at [facility], [address], [place], [place] [address]    Report Status 06/04/2023 FINAL  Final     Radiology Studies: DG CHEST PORT 1 VIEW  Result Date: 06/05/2023 CLINICAL DATA:  82 year old female with history of fever. EXAM: PORTABLE CHEST 1 VIEW COMPARISON:  Chest x-ray 05/20/2023. FINDINGS: Lung volumes are low. Mild diffuse interstitial prominence and widespread peribronchial cuffing. No confluent consolidative airspace disease. No pleural effusions. No pneumothorax. Heart size is mildly enlarged. The patient is rotated to the right on today's exam, resulting in distortion of the mediastinal contours and reduced diagnostic sensitivity and specificity for mediastinal pathology. Atherosclerotic calcifications in the thoracic aorta. IMPRESSION: 1. Diffuse interstitial prominence and widespread peribronchial cuffing, concerning for an acute bronchitis. 2. Cardiomegaly. 3. Aortic atherosclerosis. Electronically Signed   By: Trudie Reed M.D.   On: 06/05/2023 13:19    Scheduled Meds:  ascorbic acid  1,000 mg Oral Daily   aspirin EC  81 mg Oral Daily   donepezil  10 mg Oral Daily   gabapentin  100 mg Oral BID   heparin  5,000 Units Subcutaneous Q12H   insulin aspart  0-6 Units Subcutaneous TID WC   melatonin  3 mg Oral QHS   pantoprazole  40 mg Oral Daily   polyethylene glycol  17 g Oral Daily   sodium chloride flush  3 mL Intravenous Q12H   Continuous Infusions:  sodium chloride     cefTRIAXone (ROCEPHIN)  IV Stopped (06/05/23 0916)   metronidazole 500 mg (06/05/23 1005)    LOS: 15 days   Marguerita Merles, DO [facility] Available via Epic secure chat 7am-7pm After these hours, please refer to coverage provider listed on [email or web address] 06/05/2023, 5:49 PM

## 2023-06-05 NOTE — Plan of Care (Signed)
  Problem: Education: Goal: Knowledge of General Education information will improve Description: Including pain rating scale, medication(s)/side effects and non-pharmacologic comfort measures Outcome: Progressing   Problem: Health Behavior/Discharge Planning: Goal: Ability to manage health-related needs will improve Outcome: Progressing   Problem: Clinical Measurements: Goal: Ability to maintain clinical measurements within normal limits will improve Outcome: Progressing Goal: Will remain free from infection Outcome: Progressing Goal: Diagnostic test results will improve Outcome: Progressing Goal: Respiratory complications will improve Outcome: Progressing Goal: Cardiovascular complication will be avoided Outcome: Progressing   Problem: Activity: Goal: Risk for activity intolerance will decrease Outcome: Progressing   Problem: Nutrition: Goal: Adequate nutrition will be maintained Outcome: Progressing   Problem: Coping: Goal: Level of anxiety will decrease Outcome: Progressing   Problem: Elimination: Goal: Will not experience complications related to bowel motility Outcome: Progressing Goal: Will not experience complications related to urinary retention Outcome: Progressing   Problem: Pain Managment: Goal: General experience of comfort will improve Outcome: Progressing   Problem: Safety: Goal: Ability to remain free from injury will improve Outcome: Progressing   Problem: Skin Integrity: Goal: Risk for impaired skin integrity will decrease Outcome: Progressing   Problem: Education: Goal: Ability to describe self-care measures that may prevent or decrease complications (Diabetes Survival Skills Education) will improve Outcome: Progressing Goal: Individualized Educational Video(s) Outcome: Progressing   Problem: Coping: Goal: Ability to adjust to condition or change in health will improve Outcome: Progressing

## 2023-06-06 DIAGNOSIS — N1832 Chronic kidney disease, stage 3b: Secondary | ICD-10-CM | POA: Diagnosis not present

## 2023-06-06 LAB — CBC WITH DIFFERENTIAL/PLATELET
Abs Immature Granulocytes: 0.05 10*3/uL (ref 0.00–0.07)
Basophils Absolute: 0.1 10*3/uL (ref 0.0–0.1)
Basophils Relative: 1 %
Eosinophils Absolute: 0.6 10*3/uL — ABNORMAL HIGH (ref 0.0–0.5)
Eosinophils Relative: 6 %
HCT: 25.7 % — ABNORMAL LOW (ref 36.0–46.0)
Hemoglobin: 8.3 g/dL — ABNORMAL LOW (ref 12.0–15.0)
Immature Granulocytes: 1 %
Lymphocytes Relative: 21 %
Lymphs Abs: 2.3 10*3/uL (ref 0.7–4.0)
MCH: 29 pg (ref 26.0–34.0)
MCHC: 32.3 g/dL (ref 30.0–36.0)
MCV: 89.9 fL (ref 80.0–100.0)
Monocytes Absolute: 1 10*3/uL (ref 0.1–1.0)
Monocytes Relative: 9 %
Neutro Abs: 7 10*3/uL (ref 1.7–7.7)
Neutrophils Relative %: 62 %
Platelets: 385 10*3/uL (ref 150–400)
RBC: 2.86 MIL/uL — ABNORMAL LOW (ref 3.87–5.11)
RDW: 15 % (ref 11.5–15.5)
WBC: 11 10*3/uL — ABNORMAL HIGH (ref 4.0–10.5)
nRBC: 0 % (ref 0.0–0.2)

## 2023-06-06 LAB — COMPREHENSIVE METABOLIC PANEL
ALT: 43 U/L (ref 0–44)
AST: 28 U/L (ref 15–41)
Albumin: 2.1 g/dL — ABNORMAL LOW (ref 3.5–5.0)
Alkaline Phosphatase: 95 U/L (ref 38–126)
Anion gap: 7 (ref 5–15)
BUN: 26 mg/dL — ABNORMAL HIGH (ref 8–23)
CO2: 24 mmol/L (ref 22–32)
Calcium: 9 mg/dL (ref 8.9–10.3)
Chloride: 110 mmol/L (ref 98–111)
Creatinine, Ser: 1.63 mg/dL — ABNORMAL HIGH (ref 0.44–1.00)
GFR, Estimated: 31 mL/min — ABNORMAL LOW (ref >60)
Glucose, Bld: 108 mg/dL — ABNORMAL HIGH (ref 70–99)
Potassium: 4.4 mmol/L (ref 3.5–5.1)
Sodium: 141 mmol/L (ref 135–145)
Total Bilirubin: 0.7 mg/dL (ref 0.3–1.2)
Total Protein: 6 g/dL — ABNORMAL LOW (ref 6.5–8.1)

## 2023-06-06 LAB — MAGNESIUM: Magnesium: 2.1 mg/dL (ref 1.7–2.4)

## 2023-06-06 LAB — GLUCOSE, CAPILLARY
Glucose-Capillary: 105 mg/dL — ABNORMAL HIGH (ref 70–99)
Glucose-Capillary: 165 mg/dL — ABNORMAL HIGH (ref 70–99)
Glucose-Capillary: 180 mg/dL — ABNORMAL HIGH (ref 70–99)

## 2023-06-06 LAB — PHOSPHORUS: Phosphorus: 3.9 mg/dL (ref 2.5–4.6)

## 2023-06-06 NOTE — Progress Notes (Addendum)
PROGRESS NOTE   Evangaline Sillas  NFA:[id number]    DOB: [birth year]    DOA: 05/20/2023  PCP: Earley Brooke., MD   I have briefly reviewed patients previous medical records in [facility].  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  Patient noted to have new onset LLE weakness>MRI brain no evidence of acute stroke.  Seen by neurology underwent MRI C and T-spine. 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  S/p T11-12 transpedicular thoracic discectomy by Dr. Johnsie Cancel 8/16.  Postop course was complicated by fever, epigastric pain, abnormal LFTs all of which seem to have resolved.   Assessment & Plan:  Principal Problem:   Rhabdomyolysis Active Problems:   Mechanical fall   Acute kidney injury superimposed on chronic kidney disease stage 3A (HCC)   Essential hypertension   Prolonged P-R interval   Hyperlipidemia   Insulin dependent type 2 diabetes mellitus (HCC)   Leukocytosis   Elevated bilirubin   Transaminitis   Enlarged thyroid   Dehydration   Hyperkalemia   Acute kidney injury superimposed on stage IIIb chronic kidney disease Creatinine had peaked to 2.27 this admission but this has improved. Creatinine has plateaued in the 1.6-1.7 range over the last several days, may be a new baseline and stable. Avoid nephrotoxics, CT contrast, hypotension. Monitor BMP periodically.  Hyperkalemia: Resolved  Rhabdomyolysis Secondary to fall and prolonged immobilization on floor Had presented with CK of  2412 Improved and resolved after IV fluids.  Minimal HS Troponin elevation/chest pain Peaked to 37, secondary to rhabdomyolysis. ACS ruled out.  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   S/p T11-12 transpedicular thoracic discectomy by Dr. Johnsie Cancel 8/16. OP Neurosurgery followup.  Cervical spinal canal stenosis MRI C-spine 8/11: Severe spinal canal stenosis at C3-4 and C4-5 with spinal cord compression and hyperintense T2 weighted signal within the spinal cord at C3-4, consistent with compressive myelopathy. As per neurosurgery consultation note from 8/12, they felt that the cervical myelopathy was chronic and asymptomatic at this time and can be monitored and treated on an outpatient basis. Therapies have evaluated and recommend SNF Surgical incision site looks clean but the upper part of the incision appears to have splayed a little, suture still present.  Essential hypertension: Controlled on no scheduled meds.  Hyperlipidemia Rosuvastatin was initially held due to rhabdomyolysis, then initiated but due to abnormal LFTs was held again. LFTs have normalized, consider resuming statins.  Insulin-dependent type 2 DM A1c 6.4 Well-controlled on SSI alone and has barely received any doses Discontinued SSI but continue to monitor CBGs.  Leukocytosis and fever Had high fevers on 8/18.  Mid to right-sided abdominal pain x 2 hours after eating on 8/19 Workup including UA and blood cultures were negative.  Urine culture <10K colonies RUQ ultrasound showed distended gallbladder with wall  thickening, shadowing stones and sludge. HIDA scan was negative for CBD or cystic duct obstruction, Surgical team seen  and signed off. Repeat blood cultures from 8/25: Negative to date. In the absence of high fevers, almost resolved leukocytosis, unclear what we are treating. Completed 4 days of IV ceftriaxone and metronidazole, in the absence of clear indication, discontinued antibiotics and monitor off of it for recurrent fevers.  Anemia: Stable.  Abnormal LFTs Acute hepatitis panel negative Resolved.   Enlarged thyroid:  Incidental finding on CT. TFTs normal Outpatient follow-up.   Body mass index is 39.57 kg/m./Obesity   ACP Documents: None present DVT prophylaxis: heparin injection 5,000 Units Start: 05/29/23 2200 SCDs Start: 05/21/23 0450 Place TED hose Start: 05/21/23 0450     Code Status: Full Code:  Family Communication: None at bedside Disposition:  Status is: Inpatient Remains inpatient appropriate because: DC IV antibiotics and monitor for fever.  If no further fevers off of antibiotics, consider DC to SNF in the next 24 to 48 hours.  TOC alerted.     Consultants:   Neurology Neurosurgery Cardiology General surgery  Procedures:   As above  Antimicrobials:   As above   Subjective:  Seen this morning along with patient's nurse.  Patient reports that she has her good and bad days related to back pain.  This morning reported pain as 7/10.  Had BM 2 nights ago.  States that she has not been out of bed.  No other complaints reported.  Denies abdominal pain.  Objective:   Vitals:   06/06/23 0600 06/06/23 0632 06/06/23 0810 06/06/23 1457  BP:  (!) 141/63 (!) 140/65 (!) 150/69  Pulse:  66 66 73  Resp:  17 16 18   Temp:  99 F (37.2 C) 98.6 F (37 C) 98.4 F (36.9 C)  TempSrc:  Oral Oral Oral  SpO2:  91% 93% 91%  Weight: 125.1 kg     Height:        General exam: Elderly female, moderately built and obese lying comfortably propped up in bed without distress. Respiratory system: Slightly diminished breath sounds at the bases but otherwise clear to  auscultation. Cardiovascular system: S1 & S2 heard, RRR. No JVD, murmurs, rubs, gallops or clicks.  Trace bilateral ankle edema. Gastrointestinal system: Abdomen is nondistended, soft and nontender. No organomegaly or masses felt. Normal bowel sounds heard. Central nervous system: Alert and oriented. No focal neurological deficits. Extremities: Symmetric 5 x 5 power in upper extremities and at least grade 3 x 5 in lower extremities. Skin: No rashes, lesions or ulcers.  With assistance from RN, turn patient and examined spine surgery site.  Incision looks clean and dry but in the cephalic aspect, incision appears slightly splayed but suture still intact Psychiatry: Judgement and insight appear normal. Mood & affect appropriate.     Data Reviewed:   I have personally reviewed following labs and imaging studies   CBC: Recent Labs  Lab 06/04/23 0404 06/05/23 0435 06/06/23 0807  WBC 12.2* 13.1* 11.0*  NEUTROABS 7.8* 9.0* 7.0  HGB 8.0* 8.9* 8.3*  HCT 24.4* 27.9* 25.7*  MCV 90.7 90.6 89.9  PLT 329 373 385    Basic Metabolic Panel: Recent Labs  Lab 06/02/23 0022 06/03/23 0617 06/04/23 0404 06/05/23 0435 06/06/23 0807  NA 141 141 142 143 141  K 4.6 4.7 4.9 4.9 4.4  CL 111 108 108 106 110  CO2 21* 25 25 24 24   GLUCOSE 151* 148* 125* 136* 108*  BUN 35* 31*  28* 30* 26*  CREATININE 1.73* 1.85* 1.64* 1.73* 1.63*  CALCIUM 8.7* 8.9 9.0 9.3 9.0  MG 2.3 2.2 2.2 2.1 2.1  PHOS 3.6 4.1 4.2 4.4 3.9    Liver Function Tests: Recent Labs  Lab 06/02/23 0022 06/03/23 0617 06/04/23 0404 06/05/23 0435 06/06/23 0807  AST 130* 72* 38 31 28  ALT 118* 104* 72* 61* 43  ALKPHOS 137* 132* 120 111 95  BILITOT 0.7 0.8 0.8 0.8 0.7  PROT 5.7* 6.1* 5.7* 6.3* 6.0*  ALBUMIN 2.0* 2.1* 1.9* 2.1* 2.1*    CBG: Recent Labs  Lab 06/05/23 1641 06/05/23 2234 06/06/23 0807  GLUCAP 128* 125* 105*    Microbiology Studies:   Recent Results (from the past 240 hour(s))  Culture, blood (Routine X  2) w Reflex to ID Panel     Status: None   Collection Time: 05/30/23  4:18 AM   Specimen: BLOOD RIGHT HAND  Result Value Ref Range Status   Specimen Description BLOOD RIGHT HAND  Final   Special Requests   Final    BOTTLES DRAWN AEROBIC AND ANAEROBIC Blood Culture adequate volume   Culture   Final    NO GROWTH 5 DAYS Performed at [facility], [address], [place], [place] [address]    Report Status 06/04/2023 FINAL  Final  Culture, blood (Routine X 2) w Reflex to ID Panel     Status: None   Collection Time: 05/30/23  4:21 AM   Specimen: BLOOD RIGHT ARM  Result Value Ref Range Status   Specimen Description BLOOD RIGHT ARM  Final   Special Requests   Final    BOTTLES DRAWN AEROBIC AND ANAEROBIC Blood Culture adequate volume   Culture   Final    NO GROWTH 5 DAYS Performed at [facility], [address] [place], [place] [address]    Report Status 06/04/2023 FINAL  Final  Culture, blood (Routine X 2) w Reflex to ID Panel     Status: None (Preliminary result)   Collection Time: 06/05/23  9:30 AM   Specimen: BLOOD RIGHT HAND  Result Value Ref Range Status   Specimen Description BLOOD RIGHT HAND  Final   Special Requests   Final    BOTTLES DRAWN AEROBIC AND ANAEROBIC Blood Culture adequate volume   Culture   Final    NO GROWTH 1 DAY Performed at [facility], [address], [place], [place] [address]    Report Status PENDING  Incomplete  Culture, blood (Routine X 2) w Reflex to ID Panel     Status: None (Preliminary result)   Collection Time: 06/05/23  9:31 AM   Specimen: BLOOD LEFT HAND  Result Value Ref Range Status   Specimen Description BLOOD LEFT HAND  Final   Special Requests   Final    BOTTLES DRAWN AEROBIC AND ANAEROBIC Blood Culture results may not be optimal due to an excessive volume of blood received in culture bottles   Culture   Final    NO GROWTH 1 DAY Performed at [facility], [address], [place], [place] [address]    Report  Status PENDING  Incomplete    Radiology Studies:  DG CHEST PORT 1 VIEW  Result Date: 06/05/2023 CLINICAL DATA:  82 year old female with history of fever. EXAM: PORTABLE CHEST 1 VIEW COMPARISON:  Chest x-ray 05/20/2023. FINDINGS: Lung volumes are low. Mild diffuse interstitial prominence and widespread peribronchial cuffing. No confluent consolidative airspace disease. No pleural effusions. No  pneumothorax. Heart size is mildly enlarged. The patient is rotated to the right on today's exam, resulting in distortion of the mediastinal contours and reduced diagnostic sensitivity and specificity for mediastinal pathology. Atherosclerotic calcifications in the thoracic aorta. IMPRESSION: 1. Diffuse interstitial prominence and widespread peribronchial cuffing, concerning for an acute bronchitis. 2. Cardiomegaly. 3. Aortic atherosclerosis. Electronically Signed   By: Trudie Reed M.D.   On: 06/05/2023 13:19    Scheduled Meds:    ascorbic acid  1,000 mg Oral Daily   aspirin EC  81 mg Oral Daily   donepezil  10 mg Oral Daily   gabapentin  100 mg Oral BID   heparin  5,000 Units Subcutaneous Q12H   insulin aspart  0-6 Units Subcutaneous TID WC   melatonin  3 mg Oral QHS   pantoprazole  40 mg Oral Daily   polyethylene glycol  17 g Oral Daily   sodium chloride flush  3 mL Intravenous Q12H    Continuous Infusions:    sodium chloride     cefTRIAXone (ROCEPHIN)  IV 2 g (06/06/23 0909)   metronidazole 500 mg (06/06/23 0906)     LOS: 16 days     Marcellus Scott, MD,  FACP, FHM, SFHM, [facility], [facility]   [facility] [place]      To contact the attending provider between 7A-7P or the covering provider during after hours 7P-7A, please log into the web site [email or web address] and access using universal [place] password for that web site. If you do not have the password, please call the hospital operator.  06/06/2023, 3:58 PM

## 2023-06-07 DIAGNOSIS — D72829 Elevated white blood cell count, unspecified: Secondary | ICD-10-CM | POA: Diagnosis not present

## 2023-06-07 DIAGNOSIS — N1832 Chronic kidney disease, stage 3b: Secondary | ICD-10-CM | POA: Diagnosis not present

## 2023-06-07 DIAGNOSIS — R509 Fever, unspecified: Secondary | ICD-10-CM | POA: Diagnosis not present

## 2023-06-07 LAB — GLUCOSE, CAPILLARY
Glucose-Capillary: 124 mg/dL — ABNORMAL HIGH (ref 70–99)
Glucose-Capillary: 121 mg/dL — ABNORMAL HIGH (ref 70–99)
Glucose-Capillary: 155 mg/dL — ABNORMAL HIGH (ref 70–99)
Glucose-Capillary: 149 mg/dL — ABNORMAL HIGH (ref 70–99)

## 2023-06-07 NOTE — Plan of Care (Signed)
  Problem: Education: Goal: Knowledge of General Education information will improve Description Including pain rating scale, medication(s)/side effects and non-pharmacologic comfort measures Outcome: Progressing   

## 2023-06-07 NOTE — Plan of Care (Signed)

## 2023-06-07 NOTE — TOC Progression Note (Signed)
Transition of Care [facility]) - Progression Note    Patient Details  Name: Katherine Cook MRN: [id number] Date of Birth: [birth year]  [facility] of [facility]) CM/SW Contact  Abigail Teall A [place], [place] Phone Number: 06/07/2023, 3:21 PM  Clinical Narrative:     Update 06/08/23 0900 Insurance auth pending for pt.   TOC will continue to follow.   CSW uploaded requested PT notes for pt to [facility] portal. Auth pending  TOC will continue to follow.   Expected Discharge Plan: Skilled Nursing Facility Barriers to Discharge: Barriers Resolved  Expected Discharge Plan and Services In-house Referral: Clinical Social Work     Living arrangements for the past 2 months: Single Family Home                                       Social Determinants of Health (SDOH) Interventions SDOH Screenings   Food Insecurity: No Food Insecurity (05/21/2023)  Housing: Low Risk  (05/21/2023)  Transportation Needs: No Transportation Needs (05/21/2023)  Utilities: Not At Risk (05/21/2023)  Tobacco Use: Low Risk  (05/27/2023)    Readmission Risk Interventions     No data to display

## 2023-06-07 NOTE — Progress Notes (Addendum)
PROGRESS NOTE   Harbor Hembree  ZOX:[id number]    DOB: [birth year]    DOA: 05/20/2023  PCP: Earley Brooke., MD   I have briefly reviewed patients previous medical records in [facility].  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  Following the fall, she developed weakness and was unable to get up and was on floor for extended duration.  She was admitted for mechanical fall, rhabdomyolysis, reactive leukocytosis, acute kidney injury complicating CKD, elevated troponin.  Patient noted to have new onset LLE weakness>MRI brain no evidence of acute stroke.  Seen by neurology underwent MRI C and T-spine. 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  S/p T11-12 transpedicular thoracic discectomy by Dr. Johnsie Cancel 8/16.  Postop course was complicated by fever, epigastric pain, abnormal LFTs all of which seem to have resolved.   Assessment & Plan:  Principal Problem:   Rhabdomyolysis Active Problems:   Mechanical fall   Acute kidney injury superimposed on chronic kidney disease stage 3A (HCC)   Essential hypertension   Prolonged P-R interval   Hyperlipidemia   Insulin dependent type 2 diabetes mellitus (HCC)   Leukocytosis   Elevated bilirubin   Transaminitis   Enlarged thyroid   Dehydration   Hyperkalemia   Acute kidney injury superimposed on stage IIIb chronic kidney disease Creatinine had peaked to 2.27 this admission but this has improved. Creatinine has plateaued in the 1.6-1.7 range over the last several days, may be a new baseline and stable. Avoid nephrotoxics, CT contrast, hypotension. Monitor BMP  periodically.  Hyperkalemia: Resolved  Rhabdomyolysis Secondary to fall and prolonged immobilization on floor (downtime approximately 10 to 12 hours) Extensive imaging without evidence of fracture Had presented with CK of 2412 Improved and resolved after IV fluids.  Minimal HS Troponin elevation/chest pain Peaked to 37, secondary to rhabdomyolysis. ACS ruled out. 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  As per neurology consultation on 8/10, she reported that she got out of bed and her left leg gave out, associated diminished sensation to light touch on the left leg, exam revealed weakness of all muscle groups of the LLE from foot up to the hip with sensory level around T10.  They recommended MRI of the C and T-spine. MRI T-spine 8/11: Large left subarticular disc protrusion superimposed on a diffuse disc bulge at T11-12 causing severe spinal canal stenosis with impingement of the spinal cord. 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   S/p T11-12 transpedicular thoracic discectomy by Dr. Johnsie Cancel 8/16. Had pre op clearance by Cardiology Therapies have evaluated and recommend SNF Surgical incision site looks clean but the upper part of the incision appears to have splayed a little, suture still present. OP Neurosurgery followup.  Neurosurgery last saw patient on 8/18.  Cervical spinal canal stenosis MRI C-spine 8/11: Severe spinal canal stenosis at C3-4 and C4-5 with spinal cord compression and hyperintense T2 weighted signal within the spinal cord at C3-4, consistent with compressive myelopathy. As per neurosurgery consultation note from 8/12, they felt that the cervical myelopathy was chronic and asymptomatic at this time and  can be monitored and treated on an outpatient basis.  Essential hypertension: Controlled on no scheduled meds.  Hyperlipidemia Rosuvastatin was initially held  due to rhabdomyolysis, then initiated but due to abnormal LFTs was held again. LFTs have normalized, consider resuming statins.  Insulin-dependent type 2 DM A1c 6.4 Well-controlled on SSI alone and has barely received any doses Discontinued SSI but continue to monitor CBGs.  Leukocytosis and fever Had high fevers (103 F) on 8/18.  Mid to right-sided abdominal pain x 2 hours after eating on 8/19 Workup including UA and blood cultures were negative.  Urine culture <10K colonies RUQ ultrasound showed distended gallbladder with wall thickening, shadowing stones and sludge. HIDA scan was negative for CBD or cystic duct obstruction, Surgical team was consulted with concerns of acute cholecystitis, workup for same negative, seen and signed off. Repeat blood cultures from 8/25: Negative to date. In the absence of high fevers, almost resolved leukocytosis, unclear what we are treating. Completed 4 days of IV ceftriaxone and metronidazole, in the absence of clear indication, discontinued antibiotics and monitor off of it for recurrent fevers. No recurrent fevers.  Anemia: Stable.  Abnormal LFTs Had initially presented with abnormal LFTs, mild due to hypotension and may be rhabdomyolysis.  This had resolved but then recurred again on 8/21, unclear etiology?  Biliary issue. Acute hepatitis panel negative Resolved.   Enlarged thyroid:  Incidental finding on CT head. TFTs normal Thyroid ultrasound 05/21/2023 showed enlarged heterogeneous thyroid without discrete nodule. Outpatient follow-up and any further evaluation as deemed necessary  Mechanical fall at home Precipitating this hospital admission PT and OT recommend SNF. TOC on board, per patient-reportedly have an SNF bed and awaiting insurance authorization.   Body mass index is 40.43 kg/m./Obesity   ACP Documents: None present DVT prophylaxis: heparin injection 5,000 Units Start: 05/29/23 2200 SCDs Start: 05/21/23 0450 Place TED  hose Start: 05/21/23 0450     Code Status: Full Code:  Family Communication: None at bedside Disposition:  Medically optimized for DC to SNF pending bed and insurance authorization.  TOC team was alerted yesterday.     Consultants:   Neurology Neurosurgery Cardiology General surgery  Procedures:   As above  Antimicrobials:   As above   Subjective:  Patient was seen along with her female nurse during evaluation.  She reports that either her daughter or one of the nurses feed her.  Reports some back pain and states that she does not know what was done in the hospital (documented memory deficits).  Briefly outlined the reason she was hospitalized, back surgery etc.  Objective:   Vitals:   06/06/23 2030 06/07/23 0500 06/07/23 0505 06/07/23 0849  BP: 137/61  136/74 (!) 146/70  Pulse: 71  71 72  Resp: 17  18 18   Temp: 98.4 F (36.9 C)  98.2 F (36.8 C) 98.4 F (36.9 C)  TempSrc: Oral  Oral Oral  SpO2: 97%  93% 94%  Weight:  127.8 kg    Height:        General exam: Elderly female, moderately built and obese lying comfortably propped up in bed without distress.  Oral mucous moist. Respiratory system: Slightly diminished breath sounds at the bases but otherwise clear to auscultation. Cardiovascular system: S1 & S2 heard, RRR. No JVD, murmurs, rubs, gallops or clicks.  Trace bilateral ankle edema. Gastrointestinal system: Abdomen is nondistended, soft and nontender. No organomegaly or masses felt. Normal bowel sounds heard. Central nervous system: Alert and oriented. No focal neurological deficits. Extremities: Symmetric  5 x 5 power in upper extremities and at least grade 2 x 5 in lower extremities.  Not sure if patient is providing full effort and if back pain is limiting. Skin: No rashes, lesions or ulcers.  8/26: With assistance from RN, turn patient and examined spine surgery site.  Incision looks clean and dry but in the cephalic aspect, incision appears slightly splayed but  suture still intact Psychiatry: Judgement and insight appear impaired. Mood & affect appropriate.     Data Reviewed:   I have personally reviewed following labs and imaging studies   CBC: Recent Labs  Lab 06/04/23 0404 06/05/23 0435 06/06/23 0807  WBC 12.2* 13.1* 11.0*  NEUTROABS 7.8* 9.0* 7.0  HGB 8.0* 8.9* 8.3*  HCT 24.4* 27.9* 25.7*  MCV 90.7 90.6 89.9  PLT 329 373 385    Basic Metabolic Panel: Recent Labs  Lab 06/02/23 0022 06/03/23 0617 06/04/23 0404 06/05/23 0435 06/06/23 0807  NA 141 141 142 143 141  K 4.6 4.7 4.9 4.9 4.4  CL 111 108 108 106 110  CO2 21* 25 25 24 24   GLUCOSE 151* 148* 125* 136* 108*  BUN 35* 31* 28* 30* 26*  CREATININE 1.73* 1.85* 1.64* 1.73* 1.63*  CALCIUM 8.7* 8.9 9.0 9.3 9.0  MG 2.3 2.2 2.2 2.1 2.1  PHOS 3.6 4.1 4.2 4.4 3.9    Liver Function Tests: Recent Labs  Lab 06/02/23 0022 06/03/23 0617 06/04/23 0404 06/05/23 0435 06/06/23 0807  AST 130* 72* 38 31 28  ALT 118* 104* 72* 61* 43  ALKPHOS 137* 132* 120 111 95  BILITOT 0.7 0.8 0.8 0.8 0.7  PROT 5.7* 6.1* 5.7* 6.3* 6.0*  ALBUMIN 2.0* 2.1* 1.9* 2.1* 2.1*    CBG: Recent Labs  Lab 06/06/23 1657 06/06/23 2031 06/07/23 0824  GLUCAP 165* 180* 124*    Microbiology Studies:   Recent Results (from the past 240 hour(s))  Culture, blood (Routine X 2) w Reflex to ID Panel     Status: None   Collection Time: 05/30/23  4:18 AM   Specimen: BLOOD RIGHT HAND  Result Value Ref Range Status   Specimen Description BLOOD RIGHT HAND  Final   Special Requests   Final    BOTTLES DRAWN AEROBIC AND ANAEROBIC Blood Culture adequate volume   Culture   Final    NO GROWTH 5 DAYS Performed at [facility], [address], [place], [place] [address]    Report Status 06/04/2023 FINAL  Final  Culture, blood (Routine X 2) w Reflex to ID Panel     Status: None   Collection Time: 05/30/23  4:21 AM   Specimen: BLOOD RIGHT ARM  Result Value Ref Range Status   Specimen Description BLOOD  RIGHT ARM  Final   Special Requests   Final    BOTTLES DRAWN AEROBIC AND ANAEROBIC Blood Culture adequate volume   Culture   Final    NO GROWTH 5 DAYS Performed at [facility], [address] [place], [place] [address]    Report Status 06/04/2023 FINAL  Final  Culture, blood (Routine X 2) w Reflex to ID Panel     Status: None (Preliminary result)   Collection Time: 06/05/23  9:30 AM   Specimen: BLOOD RIGHT HAND  Result Value Ref Range Status   Specimen Description BLOOD RIGHT HAND  Final   Special Requests   Final    BOTTLES DRAWN AEROBIC AND ANAEROBIC Blood Culture adequate volume   Culture   Final  NO GROWTH 1 DAY Performed at [facility], [address] [place], [place] [address]    Report Status PENDING  Incomplete  Culture, blood (Routine X 2) w Reflex to ID Panel     Status: None (Preliminary result)   Collection Time: 06/05/23  9:31 AM   Specimen: BLOOD LEFT HAND  Result Value Ref Range Status   Specimen Description BLOOD LEFT HAND  Final   Special Requests   Final    BOTTLES DRAWN AEROBIC AND ANAEROBIC Blood Culture results may not be optimal due to an excessive volume of blood received in culture bottles   Culture   Final    NO GROWTH 1 DAY Performed at [facility], [address], [place], [place] [address]    Report Status PENDING  Incomplete    Radiology Studies:  No results found.  Scheduled Meds:    ascorbic acid  1,000 mg Oral Daily   aspirin EC  81 mg Oral Daily   donepezil  10 mg Oral Daily   gabapentin  100 mg Oral BID   heparin  5,000 Units Subcutaneous Q12H   melatonin  3 mg Oral QHS   pantoprazole  40 mg Oral Daily   polyethylene glycol  17 g Oral Daily   sodium chloride flush  3 mL Intravenous Q12H    Continuous Infusions:    sodium chloride       LOS: 17 days     Marcellus Scott, MD,  FACP, FHM, SFHM, [facility], [facility]   [facility] Ad[facility] [place]      To contact the attending  provider between 7A-7P or the covering provider during after hours 7P-7A, please log into the web site [email or web address] and access using universal [place] password for that web site. If you do not have the password, please call the hospital operator.  06/07/2023, 1:03 PM

## 2023-06-07 NOTE — Progress Notes (Signed)
Physical Therapy Treatment Patient Details Name: Katherine Cook MRN: [id number] DOB: [birth year] Today's Date: 06/07/2023   History of Present Illness 82 y.o. female admitted 05/20/23 after fall at home, down for 10-12 hrs. Pt with rhabdomyolysis, AKI on CKD. Course complicated by LLE weakness; negative CVA; MRI showed thoracic disc herniation with cord signal change. S/p T11-T12 transpedicular discectomy 8/16. PMH includes DM2, neuropathy, HTN, HLD, CKD, L foot fx (08/2022).    PT Comments  Pt greeted resting in bed and agreeable to session with continued progress towards acute goals. Session focused on upright sitting tolerance and posture for posterior chain engagement and improved balance and LE therex in sitting for increased strength and ROM. Pt able to come to sitting EOB with max A to manage BLEs and elevate trunk with significantly less time needed to complete. Once seated up EOB pt conversational throughout session and able to complete single UE and no UE supported activities with fair tolerance. Pt educated on the importance of upright sitting throughout day with pt verbalizing understanding, however pt unable to tolerate HOB >25% at end of session. Current plan remains appropriate to address deficits and maximize functional independence and decrease caregiver burden. Pt continues to benefit from skilled PT services to progress toward functional mobility goals.      If plan is discharge home, recommend the following: Two people to help with walking and/or transfers;Two people to help with bathing/dressing/bathroom   Can travel by private vehicle     No  Equipment Recommendations  Wheelchair (measurements PT);Wheelchair cushion (measurements PT);Hospital bed;Other (comment) (hoyer)    Recommendations for Other Services       Precautions / Restrictions Precautions Precautions: Fall;Back Restrictions Weight Bearing Restrictions: No Other Position/Activity Restrictions: spinal precautions  in room, reviewed.     Mobility  Bed Mobility Overal bed mobility: Needs Assistance Bed Mobility: Rolling Rolling: Max assist Sidelying to sit: Max assist     Sit to sidelying: Max assist General bed mobility comments: max assist to roll to L and elevate trunk to sititng, pt grimacing with pain but not calling out. max A to return BLEs to bed at end of session    Transfers Overall transfer level: Needs assistance                 General transfer comment: unable    Ambulation/Gait               General Gait Details: unable   Stairs             Wheelchair Mobility     Tilt Bed    Modified Rankin (Stroke Patients Only)       Balance Overall balance assessment: Needs assistance Sitting-balance support: Feet supported Sitting balance-Leahy Scale: Poor Sitting balance - Comments: posterior lean onto bil hands due to pain with pt able to briefly wash fash with single UE support, pt able to bring both hands to chair back placed in front of pt to improve posture                                    Cognition Arousal: Alert Behavior During Therapy: WFL for tasks assessed/performed Overall Cognitive Status: No family/caregiver present to determine baseline cognitive functioning Area of Impairment: Following commands, Safety/judgement                       Following Commands: Follows one step commands  consistently Safety/Judgement: Decreased awareness of deficits, Decreased awareness of safety     General Comments: improved cognition and alertness this session with pt conversational throughout with improved pain management and pt aware of need for therapy        Exercises General Exercises - Lower Extremity Ankle Circles/Pumps: AROM, Right, Left, 10 reps, Supine Long Arc Quad: AROM, Right, Left, 10 reps, Seated Hip Flexion/Marching: AROM, Right, Left, 10 reps, Seated Other Exercises Other Exercises: upright sitting to max  tolerance with bil hands on back of chair to encourage upright sitting and posteiror chain engagement pt able to maintain ~60 seconds x2    General Comments        Pertinent Vitals/Pain Pain Assessment Pain Assessment: Faces Faces Pain Scale: Hurts even more Pain Location: generalized back and legs with all movement Pain Descriptors / Indicators: Grimacing, Guarding, Crying Pain Intervention(s): Premedicated before session, Monitored during session, Limited activity within patient's tolerance    Home Living                          Prior Function            PT Goals (current goals can now be found in the care plan section) Acute Rehab PT Goals Patient Stated Goal: decreased pain PT Goal Formulation: With patient Time For Goal Achievement: 06/11/23 Progress towards PT goals: Progressing toward goals    Frequency    Min 1X/week      PT Plan      Co-evaluation              AM-PAC PT "6 Clicks" Mobility   Outcome Measure  Help needed turning from your back to your side while in a flat bed without using bedrails?: Total Help needed moving from lying on your back to sitting on the side of a flat bed without using bedrails?: Total Help needed moving to and from a bed to a chair (including a wheelchair)?: Total Help needed standing up from a chair using your arms (e.g., wheelchair or bedside chair)?: Total Help needed to walk in hospital room?: Total Help needed climbing 3-5 steps with a railing? : Total 6 Click Score: 6    End of Session   Activity Tolerance: Patient limited by pain Patient left: with call bell/phone within reach;in bed;with bed alarm set Nurse Communication: Mobility status PT Visit Diagnosis: Other abnormalities of gait and mobility (R26.89);Muscle weakness (generalized) (M62.81)     Time: 9528-4132 PT Time Calculation (min) (ACUTE ONLY): 18 min  Charges:    $Therapeutic Activity: 8-22 mins PT General Charges $$ ACUTE PT  VISIT: 1 Visit                     Clovis Warwick R. PTA Acute Rehabilitation Services Office: [phone]   Catalina Antigua 06/07/2023, 10:47 AM

## 2023-06-07 NOTE — Progress Notes (Signed)
Occupational Therapy Treatment Patient Details Name: Katherine Cook MRN: [id number] DOB: 07/01/1941 Today's Date: 06/07/2023   History of present illness 82 y.o. female admitted 05/20/23 after fall at home, down for 10-12 hrs. Pt with rhabdomyolysis, AKI on CKD. Course complicated by LLE weakness; negative CVA; MRI showed thoracic disc herniation with cord signal change. S/p T11-T12 transpedicular discectomy 8/16. PMH includes DM2, neuropathy, HTN, HLD, CKD, L foot fx (08/2022).      OT comments  Pt is making limited progress towards their acute OT goals. Pt was eating upon arrival and needed assist with packages. She remains significantly limited by pain, weakness and impaired cognition. Session focused on bed level rolling as a precursor to EOB ADLs/mobility. Overall she needs max multimodal cues to sequence task and max assist to perform, no carryover of body mechanics noted after each roll. OT to continue to follow acutely to facilitate progress towards established goals. Pt will continue to benefit from skilled inpatient follow up therapy, <3 hours/day.       If plan is discharge home, recommend the following:  A lot of help with walking and/or transfers;Two people to help with walking and/or transfers;A lot of help with bathing/dressing/bathroom;Two people to help with bathing/dressing/bathroom;Assistance with cooking/housework;Assistance with feeding;Help with stairs or ramp for entrance;Assist for transportation   Equipment Recommendations  None recommended by OT       Precautions / Restrictions Precautions Precautions: Fall;Back Precaution Comments: pt educated for precautions but no apparent recall or carryover Restrictions Weight Bearing Restrictions: No Other Position/Activity Restrictions: spinal precautions in room, reviewed.       Mobility Bed Mobility Overal bed mobility: Needs Assistance Bed Mobility: Rolling Rolling: Max assist         General bed mobility comments:  rolled L & R 2x each - continues to need max A and maximal cues. no carry over of tehcnique noted    Transfers                             ADL either performed or assessed with clinical judgement   ADL Overall ADL's : Needs assistance/impaired Eating/Feeding: Set up;Bed level Eating/Feeding Details (indicate cue type and reason): eating upon arrival, assist for packages                                 Functional mobility during ADLs: Maximal assistance;Total assistance General ADL Comments: session focused on foundational skill of rolling at bed level - pt need simple 1 step multimodal cues to sequence mody mechanics of task. limited carryover noted    Extremity/Trunk Assessment Upper Extremity Assessment Upper Extremity Assessment: Generalized weakness   Lower Extremity Assessment Lower Extremity Assessment: Defer to PT evaluation        Vision   Vision Assessment?: No apparent visual deficits   Perception Perception Perception: Not tested   Praxis Praxis Praxis: Not tested    Cognition Arousal: Alert Behavior During Therapy: [facility] for tasks assessed/performed Overall Cognitive Status: No family/caregiver present to determine baseline cognitive functioning                                 General Comments: requires one step multimodal cues              General Comments VSS.    Pertinent Vitals/ Pain  Pain Assessment Pain Assessment: Faces Faces Pain Scale: Hurts even more Pain Location: back with any movement Pain Descriptors / Indicators: Grimacing, Guarding, Crying Pain Intervention(s): Monitored during session   Frequency  Min 1X/week        Progress Toward Goals  OT Goals(current goals can now be found in the care plan section)  Progress towards OT goals: Not progressing toward goals - comment  Acute Rehab OT Goals Patient Stated Goal: less pain OT Goal Formulation: With patient Time For Goal  Achievement: 06/18/23 Potential to Achieve Goals: Good ADL Goals Pt Will Perform Grooming: standing;with min assist Pt Will Perform Lower Body Dressing: with mod assist;sit to/from stand Pt Will Transfer to Toilet: with mod assist;stand pivot transfer;bedside commode Additional ADL Goal #1: Pt will complete bed mobility with min A as a precursor to ADLs   AM-PAC OT "6 Clicks" Daily Activity     Outcome Measure   Help from another person eating meals?: A Little Help from another person taking care of personal grooming?: A Little Help from another person toileting, which includes using toliet, bedpan, or urinal?: Total Help from another person bathing (including washing, rinsing, drying)?: A Lot Help from another person to put on and taking off regular upper body clothing?: A Little Help from another person to put on and taking off regular lower body clothing?: Total 6 Click Score: 13    End of Session Equipment Utilized During Treatment: Gait belt  OT Visit Diagnosis: Unsteadiness on feet (R26.81);Other abnormalities of gait and mobility (R26.89);Muscle weakness (generalized) (M62.81);History of falling (Z91.81)   Activity Tolerance Patient limited by pain   Patient Left in bed;with bed alarm set;with call bell/phone within reach   Nurse Communication Mobility status        Time: 1610-9604 OT Time Calculation (min): 17 min  Charges: OT General Charges $OT Visit: 1 Visit OT Treatments $Therapeutic Activity: 8-22 mins  Derenda Mis, OTR/L Acute Rehabilitation Services Office [phone] Secure Chat Communication Preferred   Katherine Cook 06/07/2023, 5:05 PM

## 2023-06-08 DIAGNOSIS — M6282 Rhabdomyolysis: Secondary | ICD-10-CM | POA: Diagnosis not present

## 2023-06-08 DIAGNOSIS — N179 Acute kidney failure, unspecified: Secondary | ICD-10-CM | POA: Diagnosis not present

## 2023-06-08 DIAGNOSIS — R748 Abnormal levels of other serum enzymes: Secondary | ICD-10-CM | POA: Diagnosis not present

## 2023-06-08 DIAGNOSIS — R7989 Other specified abnormal findings of blood chemistry: Secondary | ICD-10-CM | POA: Diagnosis not present

## 2023-06-08 LAB — CBC
HCT: 26.8 % — ABNORMAL LOW (ref 36.0–46.0)
Hemoglobin: 8.7 g/dL — ABNORMAL LOW (ref 12.0–15.0)
MCH: 29.6 pg (ref 26.0–34.0)
MCHC: 32.5 g/dL (ref 30.0–36.0)
MCV: 91.2 fL (ref 80.0–100.0)
Platelets: 418 10*3/uL — ABNORMAL HIGH (ref 150–400)
RBC: 2.94 MIL/uL — ABNORMAL LOW (ref 3.87–5.11)
RDW: 15.3 % (ref 11.5–15.5)
WBC: 10.6 10*3/uL — ABNORMAL HIGH (ref 4.0–10.5)
nRBC: 0 % (ref 0.0–0.2)

## 2023-06-08 LAB — GLUCOSE, CAPILLARY
Glucose-Capillary: 115 mg/dL — ABNORMAL HIGH (ref 70–99)
Glucose-Capillary: 159 mg/dL — ABNORMAL HIGH (ref 70–99)
Glucose-Capillary: 121 mg/dL — ABNORMAL HIGH (ref 70–99)
Glucose-Capillary: 199 mg/dL — ABNORMAL HIGH (ref 70–99)

## 2023-06-08 LAB — COMPREHENSIVE METABOLIC PANEL
ALT: 53 U/L — ABNORMAL HIGH (ref 0–44)
AST: 56 U/L — ABNORMAL HIGH (ref 15–41)
Albumin: 2.2 g/dL — ABNORMAL LOW (ref 3.5–5.0)
Alkaline Phosphatase: 82 U/L (ref 38–126)
Anion gap: 10 (ref 5–15)
BUN: 22 mg/dL (ref 8–23)
CO2: 24 mmol/L (ref 22–32)
Calcium: 9.3 mg/dL (ref 8.9–10.3)
Chloride: 109 mmol/L (ref 98–111)
Creatinine, Ser: 1.56 mg/dL — ABNORMAL HIGH (ref 0.44–1.00)
GFR, Estimated: 33 mL/min — ABNORMAL LOW (ref >60)
Glucose, Bld: 125 mg/dL — ABNORMAL HIGH (ref 70–99)
Potassium: 4.9 mmol/L (ref 3.5–5.1)
Sodium: 143 mmol/L (ref 135–145)
Total Bilirubin: 0.7 mg/dL (ref 0.3–1.2)
Total Protein: 6 g/dL — ABNORMAL LOW (ref 6.5–8.1)

## 2023-06-08 MED ORDER — BISACODYL 10 MG RE SUPP
10.0000 mg | Freq: Once | RECTAL | Status: AC
  Administered 2023-06-08: 10 mg via RECTAL
  Filled 2023-06-08: qty 1

## 2023-06-08 NOTE — Progress Notes (Signed)
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  Following the fall, she developed weakness and was unable to get up and was on floor for extended duration.  She was admitted for mechanical fall, rhabdomyolysis, reactive leukocytosis, acute kidney injury complicating CKD, elevated troponin.   Patient noted to have new onset LLE weakness>MRI brain no evidence of acute stroke.  Seen by neurology underwent MRI C and T-spine. 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  S/p T11-12 transpedicular thoracic discectomy by Dr. Johnsie Cancel 8/16.   Postop course was complicated by fever, epigastric pain, abnormal LFTs all of which seem to have resolved.    Assessment/Plan:   Acute kidney injury superimposed on stage IIIb chronic kidney disease -Creatinine up to 2.27, now improved -Creatinine is down to 1.53; ? at baseline Avoid nephrotoxics, CT contrast, hypotension.    Hyperkalemia: Resolved   Rhabdomyolysis Secondary to fall and prolonged immobilization on floor (downtime approximately 10 to 12 hours) Extensive imaging without evidence of fracture Had presented with CK of 2412 Improved and resolved after IV fluids. -CK on 05/28/2023 was 246   Minimal HS Troponin elevation/chest pain Peaked to 37, secondary to rhabdomyolysis. ACS ruled out. 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  As per neurology consultation on 8/10, she reported that she got out of bed and her left leg gave out, associated diminished sensation to light  touch on the left leg, exam revealed weakness of all muscle groups of the LLE from foot up to the hip with sensory level around T10.  They recommended MRI of the C and T-spine. MRI T-spine 8/11: Large left subarticular disc protrusion superimposed on a diffuse disc bulge at T11-12 causing severe spinal canal stenosis with impingement of the spinal cord. 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   S/p T11-12 transpedicular thoracic discectomy by Dr. Johnsie Cancel 8/16. Had pre op clearance by Cardiology Therapies have evaluated and recommend SNF Surgical incision site looks clean but the upper part of the incision appears to have splayed a little, suture still present. OP Neurosurgery followup.  Neurosurgery last saw patient on 8/18.   Cervical spinal canal stenosis MRI C-spine 8/11: Severe spinal canal stenosis at C3-4 and C4-5 with spinal cord compression and hyperintense T2 weighted signal within the spinal cord at C3-4, consistent with compressive myelopathy. As per neurosurgery consultation note from 8/12, they felt that the cervical myelopathy was chronic and asymptomatic at this time and can be monitored and treated on an outpatient basis.   Essential hypertension: Controlled on no scheduled meds.   Hyperlipidemia Rosuvastatin was initially held due to rhabdomyolysis, then initiated but due to abnormal LFTs was held again. LFTs have normalized -Restart rosuvastatin at discharge   Insulin-dependent type 2 DM A1c 6.4 Well-controlled on SSI alone and has barely received any doses Discontinued SSI but continue to monitor CBGs.   Leukocytosis and fever Had high fevers (103 F) on 8/18.  Mid to right-sided abdominal pain x 2 hours after eating on 8/19 Workup including UA and blood cultures were negative.  Urine culture <10K colonies RUQ ultrasound showed distended gallbladder with wall thickening, shadowing stones and sludge. HIDA scan was negative  for CBD or cystic duct obstruction, Surgical team was consulted with concerns of acute cholecystitis, workup for same negative, seen and signed off. Repeat blood cultures from 8/25: Negative to date. Completed 4 days of IV ceftriaxone and metronidazole, in the absence of clear indication, discontinued antibiotics and monitor off of it for recurrent fevers. No recurrent fevers.   Anemia: Stable.   Abnormal LFTs Had initially presented with abnormal LFTs, mild due to hypotension and may be rhabdomyolysis.  Acute hepatitis panel negative Resolved.   Enlarged thyroid:  Incidental finding on CT head. TFTs normal Thyroid ultrasound 05/21/2023 showed enlarged heterogeneous thyroid without discrete nodule. Outpatient follow-up and any further evaluation as deemed necessary   Mechanical fall at home Precipitating this hospital admission PT and OT recommend SNF. TOC on board, per patient-reportedly have an SNF bed and awaiting insurance authorization.      Medications     ascorbic acid  1,000 mg Oral Daily   aspirin EC  81 mg Oral Daily   donepezil  10 mg Oral Daily   gabapentin  100 mg Oral BID   heparin  5,000 Units Subcutaneous Q12H   melatonin  3 mg Oral QHS   pantoprazole  40 mg Oral Daily   polyethylene glycol  17 g Oral Daily   sodium chloride flush  3 mL Intravenous Q12H     Data Reviewed:   CBG:  Recent Labs  Lab 06/07/23 0824 06/07/23 1252 06/07/23 1629 06/07/23 2105 06/08/23 0829  GLUCAP 124* 121* 155* 149* 115*    SpO2: 93 % O2 Flow Rate (L/min): 2 L/min    Vitals:   06/07/23 1627 06/07/23 2027 06/08/23 0540 06/08/23 0825  BP: (!) 129/59 (!) 153/70 (!) 120/53 (!) 159/69  Pulse: 63 69 71 72  Resp: 18 18 18 19   Temp: 97.7 F (36.5 C) 97.9 F (36.6 C) (!) 97.5 F (36.4 C) 98.5 F (36.9 C)  TempSrc: Oral Oral Oral Oral  SpO2: 92% 97% 98% 93%  Weight:      Height:          Data Reviewed:  Basic Metabolic Panel: Recent Labs  Lab  06/02/23 0022 06/03/23 0617 06/04/23 0404 06/05/23 0435 06/06/23 0807 06/08/23 0443  NA 141 141 142 143 141 143  K 4.6 4.7 4.9 4.9 4.4 4.9  CL 111 108 108 106 110 109  CO2 21* 25 25 24 24 24   GLUCOSE 151* 148* 125* 136* 108* 125*  BUN 35* 31* 28* 30* 26* 22  CREATININE 1.73* 1.85* 1.64* 1.73* 1.63* 1.56*  CALCIUM 8.7* 8.9 9.0 9.3 9.0 9.3  MG 2.3 2.2 2.2 2.1 2.1  --   PHOS 3.6 4.1 4.2 4.4 3.9  --     CBC: Recent Labs  Lab 06/02/23 0022 06/03/23 0617 06/04/23 0404 06/05/23 0435 06/06/23 0807 06/08/23 0443  WBC 11.9* 11.5* 12.2* 13.1* 11.0* 10.6*  NEUTROABS 8.3* 8.2* 7.8* 9.0* 7.0  --   HGB 8.2* 8.6* 8.0* 8.9* 8.3* 8.7*  HCT 25.2* 25.7* 24.4* 27.9* 25.7* 26.8*  MCV 91.6 91.8 90.7 90.6 89.9 91.2  PLT 242 293 329 373 385 418*    LFT Recent Labs  Lab 06/03/23 0617 06/04/23 0404 06/05/23 0435 06/06/23 0807 06/08/23 0443  AST 72* 38 31 28 56*  ALT 104* 72* 61* 43 53*  ALKPHOS 132* 120 111 95 82  BILITOT 0.8 0.8 0.8 0.7  0.7  PROT 6.1* 5.7* 6.3* 6.0* 6.0*  ALBUMIN 2.1* 1.9* 2.1* 2.1* 2.2*     Antibiotics: Anti-infectives (From admission, onward)    Start     Dose/Rate Route Frequency Ordered Stop   06/03/23 0930  cefTRIAXone (ROCEPHIN) 2 g in sodium chloride 0.9 % 100 mL IVPB  Status:  Discontinued        2 g 200 mL/hr over 30 Minutes Intravenous Every 24 hours 06/03/23 0842 06/06/23 1639   06/03/23 0930  metroNIDAZOLE (FLAGYL) IVPB 500 mg  Status:  Discontinued        500 mg 100 mL/hr over 60 Minutes Intravenous Every 12 hours 06/03/23 0842 06/06/23 1639   05/27/23 0720  ceFAZolin (ANCEF) 3-0.9 GM/100ML-% IVPB       Note to Pharmacy: Camillia Herter A: cabinet override      05/27/23 0720 05/27/23 1929   05/22/23 1100  cefTRIAXone (ROCEPHIN) 1 g in sodium chloride 0.9 % 100 mL IVPB        1 g 200 mL/hr over 30 Minutes Intravenous Every 24 hours 05/22/23 1015 05/24/23 1151        DVT prophylaxis: Heparin  Code Status: Full code  Family Communication:  No family at bedside   CONSULTS    Subjective   Denies any complaints   Objective    Physical Examination:   General-appears in no acute distress Heart-S1-S2, regular, no murmur auscultated Lungs-clear to auscultation bilaterally, no wheezing or crackles auscultated Abdomen-soft, nontender, no organomegaly Extremities-no edema in the lower extremities Neuro-alert, oriented x3, no focal deficit noted   Status is: Inpatient:             Meredeth Ide   [facility] If 7PM-7AM, please contact night-coverage at [email or web address], Office  [phone]   06/08/2023, 11:40 AM  LOS: 18 days

## 2023-06-08 NOTE — Plan of Care (Signed)

## 2023-06-08 NOTE — TOC Progression Note (Addendum)
Transition of Care [facility]) - Progression Note    Patient Details  Name: Katherine Cook MRN: [id number] Date of Birth: [birth year]  Transition of Care [facility]) CM/SW Contact  Rihaan Barrack A [place], [place] Phone Number: 06/08/2023, 2:54 PM  Clinical Narrative:     06/08/23 Update 1720 Provider informed TOC that attempt for P2P has been made. He said that pt needed updated notes sent to insurance with deadline for 12pm. Acute Rehab notified.   06/08/23 Update 1639  Insurance offering a P2P due tomorrow, Thursday at 11:30 am @ [phone] opt 5. Pt's insurance ID: [id number]. Provider notified.   TOC will continue to follow.   Pt's Berkley Harvey is being sent to the MD for review due to lack of bed transfers. Insurance considering pt may need lower level of care.   Auth pending.   TOC will continue to follow.   Expected Discharge Plan: Skilled Nursing Facility Barriers to Discharge: Barriers Resolved  Expected Discharge Plan and Services In-house Referral: Clinical Social Work     Living arrangements for the past 2 months: Single Family Home                                       Social Determinants of Health (SDOH) Interventions SDOH Screenings   Food Insecurity: No Food Insecurity (05/21/2023)  Housing: Low Risk  (05/21/2023)  Transportation Needs: No Transportation Needs (05/21/2023)  Utilities: Not At Risk (05/21/2023)  Tobacco Use: Low Risk  (05/27/2023)    Readmission Risk Interventions     No data to display

## 2023-06-08 NOTE — Plan of Care (Signed)

## 2023-06-09 DIAGNOSIS — M6282 Rhabdomyolysis: Secondary | ICD-10-CM | POA: Diagnosis not present

## 2023-06-09 LAB — GLUCOSE, CAPILLARY
Glucose-Capillary: 127 mg/dL — ABNORMAL HIGH (ref 70–99)
Glucose-Capillary: 149 mg/dL — ABNORMAL HIGH (ref 70–99)
Glucose-Capillary: 119 mg/dL — ABNORMAL HIGH (ref 70–99)
Glucose-Capillary: 139 mg/dL — ABNORMAL HIGH (ref 70–99)

## 2023-06-09 MED ORDER — HYDROCODONE-ACETAMINOPHEN 5-325 MG PO TABS
1.0000 | ORAL_TABLET | Freq: Once | ORAL | Status: AC
  Administered 2023-06-09: 1 via ORAL
  Filled 2023-06-09: qty 1

## 2023-06-09 MED ORDER — POLYETHYLENE GLYCOL 3350 17 G PO PACK: 17.0000 g | PACK | Freq: Every day | ORAL | Status: DC

## 2023-06-09 MED ORDER — HYDROCODONE-ACETAMINOPHEN 5-325 MG PO TABS: 1.0000 | ORAL_TABLET | Freq: Four times a day (QID) | ORAL | 0 refills | Status: DC | PRN

## 2023-06-09 MED ORDER — MELATONIN 3 MG PO TABS: 3.0000 mg | ORAL_TABLET | Freq: Every day | ORAL | Status: DC

## 2023-06-09 NOTE — Plan of Care (Signed)

## 2023-06-09 NOTE — TOC Transition Note (Signed)
Transition of Care [facility]) - CM/SW Discharge Note   Patient Details  Name: Katherine Cook MRN: [id number] Date of Birth: Jun 25, 1941  Transition of Care [facility]) CM/SW Contact:  Deatra Robinson, [place] Phone Number: 06/09/2023, 2:05 PM   Clinical Narrative:  pt for dc to [facility] today. Spoke to [facility] in admissions who confirmed they are prepared to admit pt to room 202. Pt's dtr [name] aware of dc and reports agreeable. RN provided with number for report and PTAR arranged for transport. SW signing off at dc.   Dellie Burns, MSW, LCSW [phone] (coverage)       Final next level of care: Skilled Nursing Facility Barriers to Discharge: No Barriers Identified   Patient Goals and CMS Choice CMS Medicare.gov Compare Post Acute Care list provided to:: Patient Choice offered to / list presented to : Adult Children, Patient  Discharge Placement                [facility] Patient to be transferred to facility by: [facility] Name of family member notified: [name]/dtr Patient and family notified of of transfer: 06/09/23  Discharge Plan and Services Additional resources added to the After Visit Summary for   In-house Referral: Clinical Social Work                                   Social Determinants of Health (SDOH) Interventions SDOH Screenings   Food Insecurity: No Food Insecurity (05/21/2023)  Housing: Low Risk  (05/21/2023)  Transportation Needs: No Transportation Needs (05/21/2023)  Utilities: Not At Risk (05/21/2023)  Tobacco Use: Low Risk  (05/27/2023)     Readmission Risk Interventions     No data to display

## 2023-06-09 NOTE — TOC Progression Note (Addendum)
Updated PT notes uploaded to [facility] portal per their request. Will provide updates as available. Plan remains for [facility] pending auth.   UPDATE 1320: auth received for [facility]. Confirmed they are prepared to admit today. MD updated.   Dellie Burns, MSW, LCSW [phone] (coverage)

## 2023-06-09 NOTE — Progress Notes (Signed)
Physical Therapy Treatment Patient Details Name: Katherine Cook MRN: [id number] DOB: [birth year] Today's Date: 06/09/2023   History of Present Illness 82 y.o. female admitted 05/20/23 after fall at home, down for 10-12 hrs. Pt with rhabdomyolysis, AKI on CKD. Course complicated by LLE weakness; negative CVA; MRI showed thoracic disc herniation with cord signal change. S/p T11-T12 transpedicular discectomy 8/16. PMH includes DM2, neuropathy, HTN, HLD, CKD, L foot fx (08/2022).    PT Comments  Pt greeted resting in bed and agreeable to session with focus on standing and OOB transfer tolerance. Pt continues to require max A to complete bed mobility with pt able to grasp bedrail and initiate elevate of trunk sidelying>sit with hand over hand and cues for sequencing. Pt able to come to partial stand x5 trials from elevate EOB in stedy frame with mod A to elevate hips fully from bed surface. Pt with anterior flexed trunk in partial standing and unable to bring hips forward to elevate trunk to full upright standing despite max multimodal cues. Maxi-move pad placed in partial standing and pt with good tolerance for lift transfer to chair. Pt up in chair at end of session and educated on importance of time up OOB with pt verbalizing understanding. Current plan remains appropriate, <3 hrs/day, to address deficits and maximize functional independence and decrease caregiver burden. Pt continues to benefit from skilled PT services to progress toward functional mobility goals.      If plan is discharge home, recommend the following: Two people to help with walking and/or transfers;Two people to help with bathing/dressing/bathroom   Can travel by private vehicle     No  Equipment Recommendations  Wheelchair (measurements PT);Wheelchair cushion (measurements PT);Hospital bed;Other (comment) (hoyer)    Recommendations for Other Services       Precautions / Restrictions Precautions Precautions: Fall;Back Precaution  Comments: pt educated for precautions but no apparent recall or carryover Restrictions Weight Bearing Restrictions: No Other Position/Activity Restrictions: spinal precautions in room, reviewed.     Mobility  Bed Mobility Overal bed mobility: Needs Assistance Bed Mobility: Rolling Rolling: Max assist Sidelying to sit: Max assist       General bed mobility comments: pt able to grip bed rail to roll to L, max A to manage BLEs and max A to elevate trunk to upright, pt needing cues to release rail to complete sidelying>sit    Transfers Overall transfer level: Needs assistance Equipment used: Ambulation equipment used Transfers: Bed to chair/wheelchair/BSC, Sit to/from Stand Sit to Stand: Max assist, From elevated surface           General transfer comment: pt able to come to partial stand x5 from elevated EOB, able to clear hips for maxi-move pad to be placed. pt with good toleracne for max-move trasnfer to chair Transfer via Lift Equipment: Maximove  Ambulation/Gait               General Gait Details: unable   Optometrist     Tilt Bed    Modified Rankin (Stroke Patients Only)       Balance Overall balance assessment: Needs assistance Sitting-balance support: Feet supported Sitting balance-Leahy Scale: Poor Sitting balance - Comments: posterior lean onto bil hands due to pain with pt able to briefly wash fash with single UE support, pt able to bring both hands to chair back placed in front of pt to improve posture   Standing balance support: Bilateral upper extremity  supported, During functional activity Standing balance-Leahy Scale: Poor Standing balance comment: And mod-max A to maintain partial stand                            Cognition Arousal: Alert Behavior During Therapy: WFL for tasks assessed/performed Overall Cognitive Status: No family/caregiver present to determine baseline cognitive  functioning Area of Impairment: Following commands, Safety/judgement                 Orientation Level: Disoriented to, Time     Following Commands: Follows one step commands consistently Safety/Judgement: Decreased awareness of deficits, Decreased awareness of safety     General Comments: requires one step multimodal cues, pt with some confusion stating that she wished PT had worked with her before today with poor recall for all previous sessions        Exercises General Exercises - Lower Extremity Long Arc Quad: AROM, Right, Left, 10 reps, Seated Hip Flexion/Marching: AROM, Right, Left, 10 reps, Seated    General Comments General comments (skin integrity, edema, etc.): VSS on RA      Pertinent Vitals/Pain Pain Assessment Pain Assessment: Faces Faces Pain Scale: Hurts little more Pain Location: back with mobility Pain Descriptors / Indicators: Grimacing, Guarding Pain Intervention(s): Monitored during session, Limited activity within patient's tolerance    Home Living                          Prior Function            PT Goals (current goals can now be found in the care plan section) Acute Rehab PT Goals Patient Stated Goal: decreased pain PT Goal Formulation: With patient Time For Goal Achievement: 06/11/23 Progress towards PT goals: Progressing toward goals    Frequency    Min 1X/week      PT Plan      Co-evaluation              AM-PAC PT "6 Clicks" Mobility   Outcome Measure  Help needed turning from your back to your side while in a flat bed without using bedrails?: Total Help needed moving from lying on your back to sitting on the side of a flat bed without using bedrails?: Total Help needed moving to and from a bed to a chair (including a wheelchair)?: Total Help needed standing up from a chair using your arms (e.g., wheelchair or bedside chair)?: Total Help needed to walk in hospital room?: Total Help needed climbing 3-5  steps with a railing? : Total 6 Click Score: 6    End of Session   Activity Tolerance: Patient tolerated treatment well Patient left: with call bell/phone within reach;in chair Nurse Communication: Mobility status;Need for lift equipment PT Visit Diagnosis: Other abnormalities of gait and mobility (R26.89);Muscle weakness (generalized) (M62.81)     Time: 2025-4270 PT Time Calculation (min) (ACUTE ONLY): 28 min  Charges:    $Therapeutic Activity: 23-37 mins PT General Charges $$ ACUTE PT VISIT: 1 Visit                     Marius Betts R. PTA Acute Rehabilitation Services Office: [phone]   Catalina Antigua 06/09/2023, 9:59 AM

## 2023-06-09 NOTE — Plan of Care (Signed)
report called to SNF, report given to [place] all questions answered.

## 2023-06-09 NOTE — Discharge Summary (Signed)
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  Following the fall, she developed weakness and was unable to get up and was on floor for extended duration.  She was admitted for mechanical fall, rhabdomyolysis, reactive leukocytosis, acute kidney injury complicating CKD, elevated troponin.Patient noted to have new onset LLE weakness>MRI brain no evidence of acute stroke.  Seen by neurology underwent MRI C and T-spine. 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  S/p T11-12 transpedicular thoracic discectomy by Dr. Johnsie Cancel 8/16.  Postop course was complicated by fever, epigastric pain, abnormal LFTs all of which seem to have resolved. PT/OT recommending SNF on discharge.  Patient is medically stable for discharge whenever possible.  Following problems were addressed during the hospitalization:  Acute kidney injury superimposed on stage IIIb chronic kidney disease -Creatinine was up to 2.27, now improved -Kidney function back to baseline.  Hyperkalemia resolved -Baseline creatinine around 1.6.  Do a BMP additional week  Rhabdomyolysis Secondary to fall and prolonged immobilization on floor (downtime approximately 10 to 12 hours) Extensive imaging without evidence of fracture Had presented with CK of 2412 Improved and resolved after IV fluids. 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  As per neurology consultation on 8/10, she reported that she got out of bed and her left leg gave out, associated diminished sensation to light touch on the left leg, exam revealed weakness of all muscle groups of the LLE from foot up to the hip with sensory level around T10.  They recommended MRI of the C and T-spine. MRI T-spine 8/11: Large left subarticular disc protrusion superimposed on a diffuse disc bulge at T11-12 causing severe spinal canal stenosis with impingement of the spinal cord. 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   S/p T11-12 transpedicular thoracic discectomy by Dr. Johnsie Cancel 8/16. Had pre op clearance by Cardiology Therapies have evaluated and recommend SNF Surgical incision site looks clean but the upper part of the incision appears to have splayed a little, suture still present. We recommended follow-up with neurosurgery as an outpatient in 2 weeks   Cervical spinal canal stenosis MRI C-spine 8/11: Severe spinal canal stenosis at C3-4 and C4-5 with spinal cord compression and hyperintense T2 weighted signal within the spinal cord at C3-4, consistent with compressive myelopathy. As per neurosurgery consultation note from 8/12, they felt that the cervical myelopathy was chronic and asymptomatic at this time and can be monitored and treated on an outpatient basis.   Essential hypertension: Currently blood stable without any medications.  Home antihypertensives discontinued   Hyperlipidemia Rosuvastatin was initially held due to rhabdomyolysis, then initiated but due to abnormal LFTs was held again. LFTs have normalized Restart rosuvastatin at discharge   Insulin-dependent type 2 DM A1c 6.4 Blood sugars have been stable without any insulin or diabetic medications.  Continue to monitor without any medications for now.    Leukocytosis and  fever Had high fevers (103 F)  on 8/18.  Mid to right-sided abdominal pain x 2 hours after eating on 8/19 Workup including UA and blood cultures were negative.  Urine culture <10K colonies RUQ ultrasound showed distended gallbladder with wall thickening, shadowing stones and sludge. HIDA scan was negative for CBD or cystic duct obstruction, Surgical team was consulted with concerns of acute cholecystitis, workup for same negative, seen and signed off. Repeat blood cultures from 8/25: Negative to date. Completed 4 days of IV ceftriaxone and metronidazole, in the absence of clear indication, discontinued antibiotics and monitor off of it for recurrent fevers. No recurrent fevers.   Chronic normocytic anemia: Stable.  Likely associated with CKD   Abnormal LFTs Had initially presented with abnormal LFTs, mild due to hypotension and may be rhabdomyolysis.  Acute hepatitis panel negative Resolved.   Enlarged thyroid:  Incidental finding on CT head. TFTs normal Thyroid ultrasound 05/21/2023 showed enlarged heterogeneous thyroid without discrete nodule. Outpatient follow-up and any further evaluation as deemed necessary   Mechanical fall at home Precipitating this hospital admission PT and OT recommend SNF. TOC on board, per patient-reportedly have an SNF bed and awaiting insurance authorization.   Discharge Diagnoses:  Principal Problem:   Rhabdomyolysis Active Problems:   Mechanical fall   Acute kidney injury superimposed on chronic kidney disease stage 3A (HCC)   Essential hypertension   Prolonged P-R interval   Hyperlipidemia   Insulin dependent type 2 diabetes mellitus (HCC)   Leukocytosis   Elevated bilirubin   Transaminitis   Enlarged thyroid   Dehydration   Hyperkalemia    Discharge Instructions  Discharge Instructions     Diet Carb Modified   Complete by: As directed    Discharge instructions   Complete by: As directed    1)Please take prescribed medications as instructed 2)Follow up with  neurosurgery as an outpatient in 2 weeks. Number the provider has been attached 3)Your blood sugars have been stable.  We have discontinued insulin.  Please continue to monitor your blood sugar at skilled nursing facility.  You might need to be restarted on diabetic medications if your blood sugars start going up. 4)Do a CBC/BMP test in a week to check your kidney function and hemoglobin   Increase activity slowly   Complete by: As directed    No wound care   Complete by: As directed       Allergies as of 06/09/2023   No Known Allergies      Medication List     STOP taking these medications    amLODipine 10 MG tablet Commonly known as: NORVASC   celecoxib 200 MG capsule Commonly known as: CELEBREX   furosemide 40 MG tablet Commonly known as: LASIX   Levemir FlexPen 100 UNIT/ML FlexPen Generic drug: insulin detemir   losartan 50 MG tablet Commonly known as: COZAAR   Mounjaro 10 MG/0.5ML Pen Generic drug: tirzepatide   pregabalin 100 MG capsule Commonly known as: LYRICA       TAKE these medications    ascorbic acid 1000 MG tablet Commonly known as: VITAMIN C Take 1,000 mg by mouth daily.   aspirin EC 81 MG tablet Take 81 mg by mouth daily.   donepezil 10 MG tablet Commonly known as: ARICEPT Take 10 mg by mouth daily.   gabapentin 100 MG capsule Commonly known as: NEURONTIN Take 100 mg by mouth 2 (two) times daily.   HYDROcodone-acetaminophen 5-325 MG tablet Commonly known as: NORCO/VICODIN Take 1-2 tablets by mouth  every 6 (six) hours as needed for moderate pain or severe pain.   melatonin 3 MG Tabs tablet Take 1 tablet (3 mg total) by mouth at bedtime.   omeprazole 20 MG capsule Commonly known as: PRILOSEC Take 20 mg by mouth daily.   polyethylene glycol 17 g packet Commonly known as: MIRALAX / GLYCOLAX Take 17 g by mouth daily. Start taking on: June 10, 2023   rosuvastatin 40 MG tablet Commonly known as: CRESTOR Take 40 mg by mouth  daily.   SUMAtriptan 100 MG tablet Commonly known as: Imitrex Take 1 tablet (100 mg total) by mouth every 6 (six) hours as needed for migraine. May repeat in 2 hours if headache persists or recurs.        Contact information for follow-up providers     Jadene Pierini, MD. Schedule an appointment as soon as possible for a visit in 2 week(s).   Specialty: Neurosurgery Contact information: [address] [place] [address] [place] [address] [phone]              Contact information for after-discharge care     Destination     [facility] .   Service: Skilled Nursing Contact information: [address] [place] [address] [phone]                    No Known Allergies  Consultations: Neurosurgery, general surgery   Procedures/Studies: DG CHEST PORT 1 VIEW  Result Date: 06/05/2023 CLINICAL DATA:  82 year old female with history of fever. EXAM: PORTABLE CHEST 1 VIEW COMPARISON:  Chest x-ray 05/20/2023. FINDINGS: Lung volumes are low. Mild diffuse interstitial prominence and widespread peribronchial cuffing. No confluent consolidative airspace disease. No pleural effusions. No pneumothorax. Heart size is mildly enlarged. The patient is rotated to the right on today's exam, resulting in distortion of the mediastinal contours and reduced diagnostic sensitivity and specificity for mediastinal pathology. Atherosclerotic calcifications in the thoracic aorta. IMPRESSION: 1. Diffuse interstitial prominence and widespread peribronchial cuffing, concerning for an acute bronchitis. 2. Cardiomegaly. 3. Aortic atherosclerosis. Electronically Signed   By: Trudie Reed M.D.   On: 06/05/2023 13:19   NM Hepatobiliary Liver Func  Result Date: 06/03/2023 CLINICAL DATA:  Cholelithiasis. EXAM: NUCLEAR MEDICINE HEPATOBILIARY IMAGING TECHNIQUE: Sequential images of the abdomen were obtained out to 60 minutes following intravenous  administration of radiopharmaceutical. A second set of images were obtained after the first with a slight delay and a second injection and morphine. RADIOPHARMACEUTICALS:  5.4 mCi Tc-1m Choletec IV initially. Second injection 1 millicurie. Subsequent administration of IV morphine. Please correlate with IV morphine. Please correlate with specific nodes from technologist for exact dose of morphine. COMPARISON:  Ultrasound 06/02/2023.  CT May 20, 2023 FINDINGS: Prompt uptake and biliary excretion of activity by the liver is seen. Biliary activity passes into small bowel, consistent with patent common bile duct. Initially gallbladder activity was not seen. After additional dosing due to the level of counts and subsequently IV morphine gallbladder activity is visible but faint. IMPRESSION: No common duct or cystic duct obstruction. There is delayed visualization of the gallbladder however. Please correlate for etiology including chronic cholecystitis. Electronically Signed   By: Karen Kays M.D.   On: 06/03/2023 17:40   US Abdomen Limited RUQ (LIVER/GB)  Result Date: 06/02/2023 CLINICAL DATA:  Abnormal liver function tests EXAM: ULTRASOUND ABDOMEN LIMITED RIGHT UPPER QUADRANT COMPARISON:  CT 05/20/2023 FINDINGS: Gallbladder: Distended gallbladder with shadowing stones. There is wall thickening up to 6  mm. There is also some sludge. No sonographic Murphy's sign reported. Common bile duct: Diameter: 5 mm Liver: No focal lesion identified. Within normal limits in parenchymal echogenicity. Portal vein is patent on color Doppler imaging with normal direction of blood flow towards the liver. Other: None. IMPRESSION: Distended gallbladder with wall thickening. Shadowing stones and sludge. Please correlate with any clinical evidence of acute cholecystitis. If there is further concern of acute cholecystitis, HIDA scan may be useful for further delineation. No separate biliary ductal dilatation Electronically Signed   By:  Karen Kays M.D.   On: 06/02/2023 20:17   DG Thoracic Spine 2 View  Result Date: 05/27/2023 CLINICAL DATA:  Thoracic discectomy at T11-12. EXAM: THORACIC SPINE 2 VIEWS COMPARISON:  MRI 05/22/2023 FINDINGS: A series of radiographs demonstrates tissue spreaders in instrumentation for localization of the relevant thoracic level. Corroborating with the ribs, the T11-12 level appears selected for example on image 6/7. IMPRESSION: 1. T11-12 level localized for discectomy. Electronically Signed   By: Gaylyn Rong M.D.   On: 05/27/2023 14:02   DG Thoracic Spine 1 View  Addendum Date: 05/27/2023   ADDENDUM REPORT: 05/27/2023 10:22 ADDENDUM: Incorrect phone number was initially provided for OR 20. I was able to successfully call this report at 1021 hours to the circulator in OR 20 after the correct phone number was provided. Electronically Signed   By: Kennith Center M.D.   On: 05/27/2023 10:22   Result Date: 05/27/2023 CLINICAL DATA:  T11-12 thoracic discectomy. EXAM: OPERATIVE THORACIC SPINE 1 VIEW(S) COMPARISON:  Intraoperative films from earlier same day FINDINGS: Cross-table lateral portable view of the thoracolumbar junction obtained at 0951 hours. No evidence for unexpected radiopaque soft tissue foreign body in the region of T11-12. Lead wires project over the posterior buttock region. IMPRESSION: No unexpected radiopaque soft tissue foreign body in the T11-12 region. Attempts starting at 10:05 a.m. on 05/27/2023 to directly called this report to OR 20 were unsuccessful as the phone was unanswered. Electronically Signed: By: Kennith Center M.D. On: 05/27/2023 10:10   DG C-Arm 1-60 Min-No Report  Result Date: 05/27/2023 Fluoroscopy was utilized by the requesting physician.  No radiographic interpretation.   DG C-Arm 1-60 Min-No Report  Result Date: 05/27/2023 Fluoroscopy was utilized by the requesting physician.  No radiographic interpretation.   MR THORACIC SPINE WO CONTRAST  Result Date:  05/22/2023 CLINICAL DATA:  Ataxia EXAM: MRI CERVICAL AND THORACIC SPINE WITHOUT CONTRAST TECHNIQUE: Multiplanar and multiecho pulse sequences of the cervical spine, to include the craniocervical junction and cervicothoracic junction, and the thoracic spine, were obtained without intravenous contrast. COMPARISON:  CT cervical spine 05/22/2023 Lumbar spine MRI 03/11/2023 FINDINGS: MRI CERVICAL SPINE FINDINGS Alignment: Physiologic. Vertebrae: No fracture, evidence of discitis, or bone lesion. Cord: There is hyperintense T2-weighted signal within the spinal cord at the C3-4 level. Mild cord atrophy. Posterior Fossa, vertebral arteries, paraspinal tissues: Negative Disc levels: C1-2: Unremarkable. C2-3: Small central disc protrusion and moderate left facet arthrosis. There is no spinal canal stenosis. No neural foraminal stenosis. C3-4: Central disc protrusion and ossification of the posterior longitudinal ligament. Severe spinal canal stenosis. The spinal cord is compressed. Severe right and moderate left neural foraminal stenosis. C4-5: Intermediate sized disc bulge with ossification of the posterior longitudinal ligament. There is spinal cord compression. Severe spinal canal stenosis. Severe bilateral neural foraminal stenosis. C5-6: Right asymmetric disc osteophyte complex. Mild spinal canal stenosis. Severe right neural foraminal stenosis. C6-7: Small central disc osteophyte complex. There is no spinal canal stenosis.  No neural foraminal stenosis. C7-T1: Normal disc space and facet joints. There is no spinal canal stenosis. No neural foraminal stenosis. MRI THORACIC SPINE FINDINGS Alignment:  Physiologic. Vertebrae: Inflated edema at T11-12.  No acute fracture. Cord: Mild spinal cord volume loss. Hyperintense T2-weighted signal within the conus medullaris at the T11-12 level. Paraspinal and other soft tissues: Negative Disc levels: There is a large left subarticular disc protrusion superimposed on a diffuse disc  bulge at T11-12 causing severe spinal canal stenosis with impingement of the spinal cord. There is also severe bilateral foraminal stenosis. The other thoracic disc levels are unremarkable. IMPRESSION: 1. 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 2. Large left subarticular disc protrusion superimposed on a diffuse disc bulge at T11-12 causing severe spinal canal stenosis with impingement of the spinal cord. There is also severe bilateral neural foraminal stenosis at this level. 3. Severe bilateral C4-5 and right C5-6 neural foraminal stenosis. Critical Value/emergent results were called by telephone at the time of interpretation on 05/22/2023 at 8:55 pm to provider ERIC [facility] , who verbally acknowledged these results. Electronically Signed   By: Deatra Robinson M.D.   On: 05/22/2023 20:57   MR CERVICAL SPINE WO CONTRAST  Result Date: 05/22/2023 CLINICAL DATA:  Ataxia EXAM: MRI CERVICAL AND THORACIC SPINE WITHOUT CONTRAST TECHNIQUE: Multiplanar and multiecho pulse sequences of the cervical spine, to include the craniocervical junction and cervicothoracic junction, and the thoracic spine, were obtained without intravenous contrast. COMPARISON:  CT cervical spine 05/22/2023 Lumbar spine MRI 03/11/2023 FINDINGS: MRI CERVICAL SPINE FINDINGS Alignment: Physiologic. Vertebrae: No fracture, evidence of discitis, or bone lesion. Cord: There is hyperintense T2-weighted signal within the spinal cord at the C3-4 level. Mild cord atrophy. Posterior Fossa, vertebral arteries, paraspinal tissues: Negative Disc levels: C1-2: Unremarkable. C2-3: Small central disc protrusion and moderate left facet arthrosis. There is no spinal canal stenosis. No neural foraminal stenosis. C3-4: Central disc protrusion and ossification of the posterior longitudinal ligament. Severe spinal canal  stenosis. The spinal cord is compressed. Severe right and moderate left neural foraminal stenosis. C4-5: Intermediate sized disc bulge with ossification of the posterior longitudinal ligament. There is spinal cord compression. Severe spinal canal stenosis. Severe bilateral neural foraminal stenosis. C5-6: Right asymmetric disc osteophyte complex. Mild spinal canal stenosis. Severe right neural foraminal stenosis. C6-7: Small central disc osteophyte complex. There is no spinal canal stenosis. No neural foraminal stenosis. C7-T1: Normal disc space and facet joints. There is no spinal canal stenosis. No neural foraminal stenosis. MRI THORACIC SPINE FINDINGS Alignment:  Physiologic. Vertebrae: Inflated edema at T11-12.  No acute fracture. Cord: Mild spinal cord volume loss. Hyperintense T2-weighted signal within the conus medullaris at the T11-12 level. Paraspinal and other soft tissues: Negative Disc levels: There is a large left subarticular disc protrusion superimposed on a diffuse disc bulge at T11-12 causing severe spinal canal stenosis with impingement of the spinal cord. There is also severe bilateral foraminal stenosis. The other thoracic disc levels are unremarkable. IMPRESSION: 1. 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 2. Large left subarticular disc protrusion superimposed on a diffuse disc bulge at T11-12 causing severe spinal canal stenosis with impingement of the spinal cord. There is also severe bilateral neural foraminal stenosis at this level. 3. Severe bilateral C4-5  and right C5-6 neural foraminal stenosis. Critical Value/emergent results were called by telephone at the time of interpretation on 05/22/2023 at 8:55 pm to provider ERIC [facility] , who verbally acknowledged these results. Electronically Signed   By: Deatra Robinson M.D.   On: 05/22/2023  20:57   MR BRAIN WO CONTRAST  Result Date: 05/21/2023 CLINICAL DATA:  Stroke suspected EXAM: MRI HEAD WITHOUT CONTRAST TECHNIQUE: Multiplanar, multiecho pulse sequences of the brain and surrounding structures were obtained without intravenous contrast. COMPARISON:  CT head 1 day prior FINDINGS: Brain: There is no acute intracranial hemorrhage, extra-axial fluid collection, or acute infarct Background parenchymal volume is normal for age. The ventricles are normal in size. Patchy and confluent FLAIR signal abnormality in the supratentorial white matter likely reflects sequela of moderate chronic small-vessel ischemic change, with small remote infarcts in the bilateral corona radiata, and basal ganglia, left thalamus, and left cerebellar hemisphere. The pituitary and suprasellar region are normal. Vascular: Normal flow voids. Skull and upper cervical spine: Normal marrow signal. Sinuses/Orbits: The paranasal sinuses are clear. Bilateral lens implants are in place. The globes and orbits are otherwise unremarkable. Other: The mastoid air cells and middle ear cavities are clear. IMPRESSION: 1. No acute intracranial pathology. 2. 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 Electronically Signed   By: Lesia Hausen M.D.   On: 05/21/2023 15:41   ECHOCARDIOGRAM COMPLETE  Result Date: 05/21/2023    ECHOCARDIOGRAM REPORT   Patient Name:   LARAY RUELL Date of Exam: 05/21/2023 Medical Rec #:  [id number]   Height:       70.0 in Accession #:    [id number]  Weight:       269.4 lb Date of Birth:  1941/04/16   BSA:          2.369 m Patient Age:    82 years    BP:           109/49 mmHg Patient Gender: F           HR:           71 bpm. Exam Location:  Inpatient Procedure: 2D Echo, Cardiac Doppler and Color Doppler Indications:    syncope  History:        Patient has no prior history of Echocardiogram examinations.                 Chronic kidney disease; Risk Factors:Hypertension,  Dyslipidemia                 and Diabetes.  Sonographer:    Delcie Roch RDCS Referring Phys: [id number] SUBRINA SUNDIL  Sonographer Comments: Technically difficult study due to poor echo windows. Image acquisition challenging due to patient body habitus. IMPRESSIONS  1. Left ventricular ejection fraction, by estimation, is 60 to 65%. The left ventricle has normal function. The left ventricle demonstrates regional wall motion abnormalities (suggestive of LBBB). There is mild concentric left ventricular hypertrophy. Left ventricular diastolic function could not be evaluated.  2. Right ventricular systolic function is normal. The right ventricular size is normal.  3. The mitral valve is degenerative. No evidence of mitral valve regurgitation. The mean mitral valve gradient is 2.0 mmHg with average heart rate of 68 bpm. Severe mitral annular calcification.  4. The aortic valve is tricuspid. There is mild calcification of the aortic valve. Aortic valve regurgitation is not visualized. Aortic valve sclerosis is present, with no evidence of aortic valve stenosis.  5. The inferior vena cava is  normal in size with greater than 50% respiratory variability, suggesting right atrial pressure of 3 mmHg. Comparison(s): No prior Echocardiogram. FINDINGS  Left Ventricle: Left ventricular ejection fraction, by estimation, is 60 to 65%. The left ventricle has normal function. The left ventricle demonstrates regional wall motion abnormalities. The left ventricular internal cavity size was normal in size. There is mild concentric left ventricular hypertrophy. Abnormal (paradoxical) septal motion, consistent with left bundle branch block. Left ventricular diastolic function could not be evaluated due to mitral annular calcification (moderate or greater). Left ventricular diastolic function could not be evaluated.  LV Wall Scoring: The anterior septum, mid inferoseptal segment, and basal inferoseptal segment are hypokinetic. Right  Ventricle: The right ventricular size is normal. No increase in right ventricular wall thickness. Right ventricular systolic function is normal. Left Atrium: Left atrial size was normal in size. Right Atrium: Right atrial size was normal in size. Pericardium: There is no evidence of pericardial effusion. Mitral Valve: The mitral valve is degenerative in appearance. Severe mitral annular calcification. No evidence of mitral valve regurgitation. The mean mitral valve gradient is 2.0 mmHg with average heart rate of 68 bpm. Tricuspid Valve: The tricuspid valve is normal in structure. Tricuspid valve regurgitation is not demonstrated. No evidence of tricuspid stenosis. Aortic Valve: The aortic valve is tricuspid. There is mild calcification of the aortic valve. Aortic valve regurgitation is not visualized. Aortic valve sclerosis is present, with no evidence of aortic valve stenosis. Pulmonic Valve: The pulmonic valve was normal in structure. Pulmonic valve regurgitation is not visualized. Aorta: The aortic root and ascending aorta are structurally normal, with no evidence of dilitation. Venous: The inferior vena cava is normal in size with greater than 50% respiratory variability, suggesting right atrial pressure of 3 mmHg. IAS/Shunts: No atrial level shunt detected by color flow Doppler.  LEFT VENTRICLE PLAX 2D LVIDd:         4.80 cm   Diastology LVIDs:         3.20 cm   LV e' medial:    4.79 cm/s LV PW:         1.10 cm   LV E/e' medial:  20.2 LV IVS:        1.10 cm   LV e' lateral:   6.96 cm/s LVOT diam:     2.30 cm   LV E/e' lateral: 13.9 LV SV:         91 LV SV Index:   38 LVOT Area:     4.15 cm  RIGHT VENTRICLE             IVC RV Basal diam:  2.90 cm     IVC diam: 1.80 cm RV S prime:     12.20 cm/s LEFT ATRIUM             Index        RIGHT ATRIUM           Index LA diam:        4.00 cm 1.69 cm/m   RA Area:     14.90 cm LA Vol (A2C):   49.6 ml 20.94 ml/m  RA Volume:   38.50 ml  16.25 ml/m LA Vol (A4C):   48.9  ml 20.64 ml/m LA Biplane Vol: 50.7 ml 21.40 ml/m  AORTIC VALVE LVOT Vmax:   100.00 cm/s LVOT Vmean:  67.700 cm/s LVOT VTI:    0.218 m  AORTA Ao Root diam: 3.50 cm Ao Asc diam:  3.50 cm MITRAL VALVE MV  Area (PHT): 3.12 cm     SHUNTS MV Mean grad:  2.0 mmHg     Systemic VTI:  0.22 m MV Decel Time: 243 msec     Systemic Diam: 2.30 cm MV E velocity: 96.70 cm/s MV A velocity: 115.00 cm/s MV E/A ratio:  0.84 Riley Lam MD Electronically signed by Riley Lam MD Signature Date/Time: 05/21/2023/12:03:57 PM    Final    US THYROID  Result Date: 05/21/2023 CLINICAL DATA:  Heterogeneous enlarged thyroid seen on CT EXAM: THYROID ULTRASOUND TECHNIQUE: Ultrasound examination of the thyroid gland and adjacent soft tissues was performed. COMPARISON:  CT cervical spine 05/20/2023 FINDINGS: Parenchymal Echotexture: Moderate heterogeneous Isthmus: 2.2 cm Right lobe: 7.6 x 3.8 x 3.5 cm Left lobe: 6.3 x 3.2 x 2.4 cm _________________________________________________________ Estimated total number of nodules >/= 1 cm: 0 Number of spongiform nodules >/=  2 cm not described below (TR1): 0 Number of mixed cystic and solid nodules >/= 1.5 cm not described below (TR2): 0 _________________________________________________________ No discrete nodules are seen within the thyroid gland. IMPRESSION: Enlarged heterogeneous thyroid without discrete nodule. The above is in keeping with the ACR TI-RADS recommendations - J Am Coll Radiol 2017;14:587-595. Electronically Signed   By: Acquanetta Belling M.D.   On: 05/21/2023 07:16   DG Hip Unilat W or Wo Pelvis 2-3 Views Right  Result Date: 05/21/2023 CLINICAL DATA:  Fall, possible hip fracture on CT EXAM: DG HIP (WITH OR WITHOUT PELVIS) 2-3V RIGHT COMPARISON:  CT abdomen/pelvis dated 05/20/2023 FINDINGS: No fracture or dislocation is seen. Specifically, the right proximal femur is intact. Bilateral joint spaces are preserved. Visualized bony pelvis is intact. Mild degenerative changes  of the lower lumbar spine. IMPRESSION: No fracture or dislocation is seen. Specifically, the right proximal femur is intact. Electronically Signed   By: Charline Bills M.D.   On: 05/21/2023 01:37   DG Pelvis Portable  Result Date: 05/20/2023 CLINICAL DATA:  fall EXAM: PORTABLE PELVIS 1-2 VIEWS COMPARISON:  None Available. FINDINGS: Limited evaluation due to overlapping osseous structures and overlying soft tissues. There is no evidence of pelvic fracture or diastasis. No hip dislocation bilaterally. Grossly unremarkable proximal femurs with no definite acute displaced fracture. Degenerative changes of bilateral sacroiliac joints. No pelvic bone lesions are seen. IMPRESSION: Negative for definite acute traumatic injury. Limited evaluation due to overlapping osseous structures and overlying soft tissues. Electronically Signed   By: Tish Frederickson M.D.   On: 05/20/2023 23:32   DG Chest Portable 1 View  Result Date: 05/20/2023 CLINICAL DATA:  fall EXAM: PORTABLE CHEST 1 VIEW COMPARISON:  Chest x-ray 07/05/2019 FINDINGS: The heart and mediastinal contours are within normal limits. Aortic calcification. No focal consolidation. No pulmonary edema. No pleural effusion. No pneumothorax. No acute osseous abnormality. IMPRESSION: 1. No active disease. 2.  Aortic Atherosclerosis (ICD10-I70.0). Electronically Signed   By: Tish Frederickson M.D.   On: 05/20/2023 23:30   CT CHEST ABDOMEN PELVIS WO CONTRAST  Result Date: 05/20/2023 CLINICAL DATA:  Polytrauma, blunt EXAM: CT CHEST, ABDOMEN AND PELVIS WITHOUT CONTRAST TECHNIQUE: Multidetector CT imaging of the chest, abdomen and pelvis was performed following the standard protocol without IV contrast. RADIATION DOSE REDUCTION: This exam was performed according to the departmental dose-optimization program which includes automated exposure control, adjustment of the mA and/or kV according to patient size and/or use of iterative reconstruction technique. COMPARISON:  MRI  right hip 02/14/2023, x-ray pelvis 05/20/2023 FINDINGS: CHEST: Cardiovascular: The thoracic aorta is normal in caliber. The heart is normal in size.  No significant pericardial effusion. Mitral annular calcification. Left anterior descending coronary calcification. Mild atherosclerotic plaque of the aorta. Lungs/Pleura: No focal consolidation. No pulmonary nodule. No pulmonary mass. No pulmonary contusion or laceration. No pneumatocele formation. No pleural effusion. No pneumothorax. No hemothorax. Mediastinum/Nodes: No pneumomediastinum. The central airways are patent. The esophagus is unremarkable. Tiny hiatal hernia. Enlarged and heterogeneous thyroid gland-follow-up with ultrasound. Limited evaluation for hilar lymphadenopathy on this noncontrast study. No mediastinal or axillary lymphadenopathy. Musculoskeletal/Chest wall No chest wall mass. No acute rib or sternal fracture. No spinal fracture. Multilevel bulky osteophyte formation. ABDOMEN / PELVIS: Hepatobiliary: Not enlarged. No focal lesion. Peripherally calcified gallstone within the gallbladder lumen. Gallstone measures up to 3 cm. No biliary ductal dilatation. Pancreas: Normal pancreatic contour. No main pancreatic duct dilatation. Spleen: Not enlarged. No focal lesion. Adrenals/Urinary Tract: No nodularity bilaterally. No hydroureteronephrosis. No nephroureterolithiasis. No contour deforming renal mass. The urinary bladder is unremarkable. Stomach/Bowel: No small or large bowel wall thickening or dilatation. The appendix is unremarkable. Vasculature/Lymphatic: Mild to moderate atherosclerotic plaque. No abdominal aorta or iliac aneurysm. No abdominal, pelvic, inguinal lymphadenopathy. Reproductive: Status post hysterectomy. Bilateral adnexa are unremarkable. Other: No simple free fluid ascites. No pneumoperitoneum. No mesenteric hematoma identified. No organized fluid collection. Musculoskeletal: No significant soft tissue hematoma. Question acute  nondisplaced right femoral neck fracture (2:116). No spinal fracture. Multilevel moderate severe degenerative changes of the spine multilevel intervertebral disc space vacuum phenomenon and mild retrolisthesis of L5 on S1. Ports and Devices: None. IMPRESSION: 1. Question acute nondisplaced right femoral neck fracture. Recommend dedicated three view right hip radiograph for further evaluation. 2. No acute intrathoracic, intra-abdominal, intrapelvic traumatic injury with limited evaluation on this noncontrast study. 3. No acute fracture or traumatic malalignment of the thoracic or lumbar spine. 4. Other imaging findings of potential clinical significance: Tiny hiatal hernia. Cholelithiasis with no CT evidence of acute cholecystitis. Aortic Atherosclerosis (ICD10-I70.0) including mitral annular and left anterior descending coronary calcification. Electronically Signed   By: Tish Frederickson M.D.   On: 05/20/2023 23:29   CT Head Wo Contrast  Result Date: 05/20/2023 CLINICAL DATA:  Head trauma, minor (Age >= 65y); Neck trauma (Age >= 65y); Facial trauma, blunt EXAM: CT HEAD WITHOUT CONTRAST CT MAXILLOFACIAL WITHOUT CONTRAST CT CERVICAL SPINE WITHOUT CONTRAST TECHNIQUE: Multidetector CT imaging of the head, cervical spine, and maxillofacial structures were performed using the standard protocol without intravenous contrast. Multiplanar CT image reconstructions of the cervical spine and maxillofacial structures were also generated. RADIATION DOSE REDUCTION: This exam was performed according to the departmental dose-optimization program which includes automated exposure control, adjustment of the mA and/or kV according to patient size and/or use of iterative reconstruction technique. COMPARISON:  CT head 07/02/2017 FINDINGS: CT HEAD FINDINGS Brain: Trace patchy and confluent areas of decreased attenuation are noted throughout the deep and periventricular white matter of the cerebral hemispheres bilaterally, compatible with  chronic microvascular ischemic disease. No evidence of large-territorial acute infarction. No parenchymal hemorrhage. No mass lesion. No extra-axial collection. No mass effect or midline shift. No hydrocephalus. Basilar cisterns are patent. Vascular: No hyperdense vessel. Skull: No acute fracture or focal lesion. Other: None. CT MAXILLOFACIAL FINDINGS Osseous: No fracture or mandibular dislocation. No destructive process. Sinuses/Orbits: Paranasal sinuses and mastoid air cells are clear. Bilateral lens replacement. Otherwise the orbits are unremarkable. Soft tissues: Negative. CT CERVICAL SPINE FINDINGS Alignment: Normal. Skull base and vertebrae: Multilevel severe degenerative changes of the spine with associated multilevel severe osseous neural foraminal stenosis. No severe osseous central canal stenosis.  At least moderate osseous central canal stenosis at the C3-C4 level due to bulky posterior disc osteophyte complex formation and posterior longitudinal ligament calcification. No acute fracture. No aggressive appearing focal osseous lesion or focal pathologic process. Soft tissues and spinal canal: No prevertebral fluid or swelling. No visible canal hematoma. Upper chest: Unremarkable. Other: Enlarged heterogeneous thyroid gland. IMPRESSION: 1. No acute intracranial abnormality. 2.  No acute displaced facial fracture. 3. No acute displaced fracture or traumatic listhesis of the cervical spine. 4. Multilevel severe osseous neural foraminal stenosis. 5. At least moderate osseous central canal stenosis at the C3-C4 level. 6. Enlarged heterogeneous thyroid gland. Recommend thyroid ultrasound (ref: J Am Coll Radiol. 2015 Feb;12(2): 143-50). Electronically Signed   By: Tish Frederickson M.D.   On: 05/20/2023 23:16   CT Cervical Spine Wo Contrast  Result Date: 05/20/2023 CLINICAL DATA:  Head trauma, minor (Age >= 65y); Neck trauma (Age >= 65y); Facial trauma, blunt EXAM: CT HEAD WITHOUT CONTRAST CT MAXILLOFACIAL  WITHOUT CONTRAST CT CERVICAL SPINE WITHOUT CONTRAST TECHNIQUE: Multidetector CT imaging of the head, cervical spine, and maxillofacial structures were performed using the standard protocol without intravenous contrast. Multiplanar CT image reconstructions of the cervical spine and maxillofacial structures were also generated. RADIATION DOSE REDUCTION: This exam was performed according to the departmental dose-optimization program which includes automated exposure control, adjustment of the mA and/or kV according to patient size and/or use of iterative reconstruction technique. COMPARISON:  CT head 07/02/2017 FINDINGS: CT HEAD FINDINGS Brain: Trace patchy and confluent areas of decreased attenuation are noted throughout the deep and periventricular white matter of the cerebral hemispheres bilaterally, compatible with chronic microvascular ischemic disease. No evidence of large-territorial acute infarction. No parenchymal hemorrhage. No mass lesion. No extra-axial collection. No mass effect or midline shift. No hydrocephalus. Basilar cisterns are patent. Vascular: No hyperdense vessel. Skull: No acute fracture or focal lesion. Other: None. CT MAXILLOFACIAL FINDINGS Osseous: No fracture or mandibular dislocation. No destructive process. Sinuses/Orbits: Paranasal sinuses and mastoid air cells are clear. Bilateral lens replacement. Otherwise the orbits are unremarkable. Soft tissues: Negative. CT CERVICAL SPINE FINDINGS Alignment: Normal. Skull base and vertebrae: Multilevel severe degenerative changes of the spine with associated multilevel severe osseous neural foraminal stenosis. No severe osseous central canal stenosis. At least moderate osseous central canal stenosis at the C3-C4 level due to bulky posterior disc osteophyte complex formation and posterior longitudinal ligament calcification. No acute fracture. No aggressive appearing focal osseous lesion or focal pathologic process. Soft tissues and spinal canal: No  prevertebral fluid or swelling. No visible canal hematoma. Upper chest: Unremarkable. Other: Enlarged heterogeneous thyroid gland. IMPRESSION: 1. No acute intracranial abnormality. 2.  No acute displaced facial fracture. 3. No acute displaced fracture or traumatic listhesis of the cervical spine. 4. Multilevel severe osseous neural foraminal stenosis. 5. At least moderate osseous central canal stenosis at the C3-C4 level. 6. Enlarged heterogeneous thyroid gland. Recommend thyroid ultrasound (ref: J Am Coll Radiol. 2015 Feb;12(2): 143-50). Electronically Signed   By: Tish Frederickson M.D.   On: 05/20/2023 23:16   CT Maxillofacial Wo Contrast  Result Date: 05/20/2023 CLINICAL DATA:  Head trauma, minor (Age >= 65y); Neck trauma (Age >= 65y); Facial trauma, blunt EXAM: CT HEAD WITHOUT CONTRAST CT MAXILLOFACIAL WITHOUT CONTRAST CT CERVICAL SPINE WITHOUT CONTRAST TECHNIQUE: Multidetector CT imaging of the head, cervical spine, and maxillofacial structures were performed using the standard protocol without intravenous contrast. Multiplanar CT image reconstructions of the cervical spine and maxillofacial structures were also generated. RADIATION DOSE REDUCTION: This  exam was performed according to the departmental dose-optimization program which includes automated exposure control, adjustment of the mA and/or kV according to patient size and/or use of iterative reconstruction technique. COMPARISON:  CT head 07/02/2017 FINDINGS: CT HEAD FINDINGS Brain: Trace patchy and confluent areas of decreased attenuation are noted throughout the deep and periventricular white matter of the cerebral hemispheres bilaterally, compatible with chronic microvascular ischemic disease. No evidence of large-territorial acute infarction. No parenchymal hemorrhage. No mass lesion. No extra-axial collection. No mass effect or midline shift. No hydrocephalus. Basilar cisterns are patent. Vascular: No hyperdense vessel. Skull: No acute fracture or  focal lesion. Other: None. CT MAXILLOFACIAL FINDINGS Osseous: No fracture or mandibular dislocation. No destructive process. Sinuses/Orbits: Paranasal sinuses and mastoid air cells are clear. Bilateral lens replacement. Otherwise the orbits are unremarkable. Soft tissues: Negative. CT CERVICAL SPINE FINDINGS Alignment: Normal. Skull base and vertebrae: Multilevel severe degenerative changes of the spine with associated multilevel severe osseous neural foraminal stenosis. No severe osseous central canal stenosis. At least moderate osseous central canal stenosis at the C3-C4 level due to bulky posterior disc osteophyte complex formation and posterior longitudinal ligament calcification. No acute fracture. No aggressive appearing focal osseous lesion or focal pathologic process. Soft tissues and spinal canal: No prevertebral fluid or swelling. No visible canal hematoma. Upper chest: Unremarkable. Other: Enlarged heterogeneous thyroid gland. IMPRESSION: 1. No acute intracranial abnormality. 2.  No acute displaced facial fracture. 3. No acute displaced fracture or traumatic listhesis of the cervical spine. 4. Multilevel severe osseous neural foraminal stenosis. 5. At least moderate osseous central canal stenosis at the C3-C4 level. 6. Enlarged heterogeneous thyroid gland. Recommend thyroid ultrasound (ref: J Am Coll Radiol. 2015 Feb;12(2): 143-50). Electronically Signed   By: Tish Frederickson M.D.   On: 05/20/2023 23:16      Subjective: Patient seen and examined at bedside today.  Hemodynamically stable.  Comfortable, lying in bed.  Alert and oriented.  Denies any significant back pain.  Had a bowel movement yesterday.  She is medically stable for discharge whenever possible.  Called her daughter Kathaleen Grinder and discussed about dc planning   Discharge Exam: Vitals:   06/09/23 0401 06/09/23 0758  BP: 137/67 (!) 141/68  Pulse: 75 71  Resp: 18 16  Temp: 98.1 F (36.7 C) 98.1 F (36.7 C)  SpO2: 92% 96%    Vitals:   06/08/23 1802 06/08/23 2003 06/09/23 0401 06/09/23 0758  BP: 131/67 134/72 137/67 (!) 141/68  Pulse: 77 77 75 71  Resp: 19 18 18 16   Temp: 97.9 F (36.6 C) 97.8 F (36.6 C) 98.1 F (36.7 C) 98.1 F (36.7 C)  TempSrc: Oral Oral Oral Oral  SpO2: 93% 95% 92% 96%  Weight:      Height:        General: Pt is alert, awake, not in acute distress Cardiovascular: RRR, S1/S2 +, no rubs, no gallops Respiratory: CTA bilaterally, no wheezing, no rhonchi Abdominal: Soft, NT, ND, bowel sounds + Extremities: no edema, no cyanosis    The results of significant diagnostics from this hospitalization (including imaging, microbiology, ancillary and laboratory) are listed below for reference.     Microbiology: Recent Results (from the past 240 hour(s))  Culture, blood (Routine X 2) w Reflex to ID Panel     Status: None (Preliminary result)   Collection Time: 06/05/23  9:30 AM   Specimen: BLOOD RIGHT HAND  Result Value Ref Range Status   Specimen Description BLOOD RIGHT HAND  Final   Special Requests  Final    BOTTLES DRAWN AEROBIC AND ANAEROBIC Blood Culture adequate volume   Culture   Final    NO GROWTH 4 DAYS Performed at [facility], [address] [place], [place] [address]    Report Status PENDING  Incomplete  Culture, blood (Routine X 2) w Reflex to ID Panel     Status: None (Preliminary result)   Collection Time: 06/05/23  9:31 AM   Specimen: BLOOD LEFT HAND  Result Value Ref Range Status   Specimen Description BLOOD LEFT HAND  Final   Special Requests   Final    BOTTLES DRAWN AEROBIC AND ANAEROBIC Blood Culture results may not be optimal due to an excessive volume of blood received in culture bottles   Culture   Final    NO GROWTH 4 DAYS Performed at [facility], [address], [place], [place] [address]    Report Status PENDING  Incomplete     Labs: BNP (last 3 results) No results for input(s): "BNP" in the last 8760 hours. Basic Metabolic  Panel: Recent Labs  Lab 06/03/23 0617 06/04/23 0404 06/05/23 0435 06/06/23 0807 06/08/23 0443  NA 141 142 143 141 143  K 4.7 4.9 4.9 4.4 4.9  CL 108 108 106 110 109  CO2 25 25 24 24 24   GLUCOSE 148* 125* 136* 108* 125*  BUN 31* 28* 30* 26* 22  CREATININE 1.85* 1.64* 1.73* 1.63* 1.56*  CALCIUM 8.9 9.0 9.3 9.0 9.3  MG 2.2 2.2 2.1 2.1  --   PHOS 4.1 4.2 4.4 3.9  --    Liver Function Tests: Recent Labs  Lab 06/03/23 0617 06/04/23 0404 06/05/23 0435 06/06/23 0807 06/08/23 0443  AST 72* 38 31 28 56*  ALT 104* 72* 61* 43 53*  ALKPHOS 132* 120 111 95 82  BILITOT 0.8 0.8 0.8 0.7 0.7  PROT 6.1* 5.7* 6.3* 6.0* 6.0*  ALBUMIN 2.1* 1.9* 2.1* 2.1* 2.2*   No results for input(s): "LIPASE", "AMYLASE" in the last 168 hours. No results for input(s): "AMMONIA" in the last 168 hours. CBC: Recent Labs  Lab 06/03/23 0617 06/04/23 0404 06/05/23 0435 06/06/23 0807 06/08/23 0443  WBC 11.5* 12.2* 13.1* 11.0* 10.6*  NEUTROABS 8.2* 7.8* 9.0* 7.0  --   HGB 8.6* 8.0* 8.9* 8.3* 8.7*  HCT 25.7* 24.4* 27.9* 25.7* 26.8*  MCV 91.8 90.7 90.6 89.9 91.2  PLT 293 329 373 385 418*   Cardiac Enzymes: No results for input(s): "CKTOTAL", "CKMB", "CKMBINDEX", "TROPONINI" in the last 168 hours. BNP: Invalid input(s): "POCBNP" CBG: Recent Labs  Lab 06/08/23 0829 06/08/23 1248 06/08/23 1623 06/08/23 2052 06/09/23 0757  GLUCAP 115* 159* 121* 199* 127*   D-Dimer No results for input(s): "DDIMER" in the last 72 hours. Hgb A1c No results for input(s): "HGBA1C" in the last 72 hours. Lipid Profile No results for input(s): "CHOL", "HDL", "LDLCALC", "TRIG", "CHOLHDL", "LDLDIRECT" in the last 72 hours. Thyroid function studies No results for input(s): "TSH", "T4TOTAL", "T3FREE", "THYROIDAB" in the last 72 hours.  Invalid input(s): "FREET3" Anemia work up No results for input(s): "VITAMINB12", "FOLATE", "FERRITIN", "TIBC", "IRON", "RETICCTPCT" in the last 72 hours. Urinalysis    Component Value  Date/Time   COLORURINE YELLOW 05/29/2023 1147   APPEARANCEUR CLEAR 05/29/2023 1147   LABSPEC 1.014 05/29/2023 1147   PHURINE 5.0 05/29/2023 1147   GLUCOSEU NEGATIVE 05/29/2023 1147   HGBUR NEGATIVE 05/29/2023 1147   BILIRUBINUR NEGATIVE 05/29/2023 1147   KETONESUR 5 (A) 05/29/2023 1147   PROTEINUR NEGATIVE  05/29/2023 1147   NITRITE NEGATIVE 05/29/2023 1147   LEUKOCYTESUR NEGATIVE 05/29/2023 1147   Sepsis Labs Recent Labs  Lab 06/04/23 0404 06/05/23 0435 06/06/23 0807 06/08/23 0443  WBC 12.2* 13.1* 11.0* 10.6*   Microbiology Recent Results (from the past 240 hour(s))  Culture, blood (Routine X 2) w Reflex to ID Panel     Status: None (Preliminary result)   Collection Time: 06/05/23  9:30 AM   Specimen: BLOOD RIGHT HAND  Result Value Ref Range Status   Specimen Description BLOOD RIGHT HAND  Final   Special Requests   Final    BOTTLES DRAWN AEROBIC AND ANAEROBIC Blood Culture adequate volume   Culture   Final    NO GROWTH 4 DAYS Performed at [facility], [address], [place], [place] [address]    Report Status PENDING  Incomplete  Culture, blood (Routine X 2) w Reflex to ID Panel     Status: None (Preliminary result)   Collection Time: 06/05/23  9:31 AM   Specimen: BLOOD LEFT HAND  Result Value Ref Range Status   Specimen Description BLOOD LEFT HAND  Final   Special Requests   Final    BOTTLES DRAWN AEROBIC AND ANAEROBIC Blood Culture results may not be optimal due to an excessive volume of blood received in culture bottles   Culture   Final    NO GROWTH 4 DAYS Performed at [facility], [address] [place], [place] [address]    Report Status PENDING  Incomplete    Please note: You were cared for by a hospitalist during your hospital stay. Once you are discharged, your primary care physician will handle any further medical issues. Please note that NO REFILLS for any discharge medications will be authorized once you are discharged, as it is  imperative that you return to your primary care physician (or establish a relationship with a primary care physician if you do not have one) for your post hospital discharge needs so that they can reassess your need for medications and monitor your lab values.    Time coordinating discharge: 40 minutes  SIGNED:   Burnadette Pop, MD  [facility] 06/09/2023, 1:23 PM Pager [phone]  If 7PM-7AM, please contact night-coverage [email or web address] Password [email or web address]

## 2023-06-10 LAB — CULTURE, BLOOD (ROUTINE X 2)
Culture: NO GROWTH
Culture: NO GROWTH
Special Requests: ADEQUATE

## 2023-10-12 DEATH — deceased
